# Patient Record
Sex: Female | Born: 1955 | Race: White | Hispanic: No | Marital: Married | Smoking: Current every day smoker
Health system: Southern US, Community
[De-identification: ages and names within clinical notes are randomized; demographics above are authoritative.]

## PROBLEM LIST (undated history)

## (undated) DIAGNOSIS — J449 Chronic obstructive pulmonary disease, unspecified: Secondary | ICD-10-CM

## (undated) DIAGNOSIS — E119 Type 2 diabetes mellitus without complications: Secondary | ICD-10-CM

## (undated) DIAGNOSIS — Z9981 Dependence on supplemental oxygen: Secondary | ICD-10-CM

## (undated) DIAGNOSIS — M549 Dorsalgia, unspecified: Secondary | ICD-10-CM

## (undated) DIAGNOSIS — R1310 Dysphagia, unspecified: Secondary | ICD-10-CM

## (undated) DIAGNOSIS — R011 Cardiac murmur, unspecified: Secondary | ICD-10-CM

## (undated) DIAGNOSIS — K219 Gastro-esophageal reflux disease without esophagitis: Secondary | ICD-10-CM

## (undated) DIAGNOSIS — E7800 Pure hypercholesterolemia, unspecified: Secondary | ICD-10-CM

## (undated) HISTORY — DX: Dorsalgia, unspecified: M54.9

## (undated) HISTORY — DX: Type 2 diabetes mellitus without complications: E11.9

## (undated) HISTORY — PX: BACK SURGERY: SHX140

## (undated) HISTORY — PX: COLON SURGERY: SHX602

## (undated) HISTORY — PX: NASAL SINUS SURGERY: SHX719

## (undated) HISTORY — DX: Dysphagia, unspecified: R13.10

## (undated) HISTORY — DX: Pure hypercholesterolemia, unspecified: E78.00

## (undated) HISTORY — PX: NECK SURGERY: SHX720

## (undated) HISTORY — PX: CHOLECYSTECTOMY: SHX55

---

## 2014-09-25 ENCOUNTER — Encounter (INDEPENDENT_AMBULATORY_CARE_PROVIDER_SITE_OTHER): Payer: Self-pay | Admitting: *Deleted

## 2014-10-23 ENCOUNTER — Encounter (INDEPENDENT_AMBULATORY_CARE_PROVIDER_SITE_OTHER): Payer: Self-pay | Admitting: Internal Medicine

## 2014-10-23 ENCOUNTER — Ambulatory Visit (INDEPENDENT_AMBULATORY_CARE_PROVIDER_SITE_OTHER): Payer: Federal, State, Local not specified - PPO | Admitting: Internal Medicine

## 2014-10-23 VITALS — BP 104/44 | HR 80 | Temp 98.1°F | Ht 63.0 in | Wt 148.8 lb

## 2014-10-23 DIAGNOSIS — I6529 Occlusion and stenosis of unspecified carotid artery: Secondary | ICD-10-CM | POA: Insufficient documentation

## 2014-10-23 DIAGNOSIS — R1314 Dysphagia, pharyngoesophageal phase: Secondary | ICD-10-CM

## 2014-10-23 DIAGNOSIS — E119 Type 2 diabetes mellitus without complications: Secondary | ICD-10-CM | POA: Insufficient documentation

## 2014-10-23 NOTE — Patient Instructions (Signed)
DG esophagram.   

## 2014-10-23 NOTE — Progress Notes (Addendum)
Subjective:    Patient ID: Dana GauzeJanet Dominguez, female    DOB: March 26, 1956, 59 y.o.   MRN: [id number]  HPI [facility] for dysphagia.Judeth Cornfield(Stephanie Crumpton, ANP). Former patient of Dr. Aleene DavidsonSpainhour. She is having problems swallowing drinks or food.  Symptoms really started after a rt carotid endarterectomy in December of 2015 by Dr. Cira Servantamber. Had small amt of dysphagia before surgery.  She says she is having trouble with all foods. Occurs every today.  She says she strangles if she drinks water.  She is eating in small amts. She cannot tell if foods are lodging or not.  Hx of EGD years ago for GERD. Appetite is good. No weight loss.  There is no abdominal pain. She has a BM one daily.  Last colonoscopy ? Dr. Aleene DavidsonSpainhour. Hx of diverticulitis and has a colon resection 2013. (Surgeon died in a plane crash).    Review of Systems Past Medical History  Diagnosis Date  . Diabetes   . High cholesterol   . Back pain     Past Surgical History  Procedure Laterality Date  . Back surgery      x 2 in the past  . Cholecystectomy      gallstone  . Carotid surgery      December 2015 rt   . Colon surgery      for diverticulitis  . Neck surgery      Allergies  Allergen Reactions  . Codeine     Dizzy, nausea, cold sweat  . Plavix [Clopidogrel Bisulfate]     SOB  . Protonix [Pantoprazole Sodium]     ? nausea  . Sporanox [Itraconazole]     Major rash    No current outpatient prescriptions on file prior to visit.   No current facility-administered medications on file prior to visit.    Outpatient Encounter Prescriptions as of 10/23/2014  Medication Sig  . ARIPiprazole (ABILIFY) 5 MG tablet Take 5 mg by mouth daily.  Marland Kitchen. aspirin 81 MG tablet Take 81 mg by mouth daily.  Marland Kitchen. atorvastatin (LIPITOR) 40 MG tablet Take 40 mg by mouth daily.  . busPIRone (BUSPAR) 15 MG tablet Take 15 mg by mouth 3 (three) times daily.  Marland Kitchen. desloratadine (CLARINEX) 5 MG  tablet Take 5 mg by mouth daily.  Marland Kitchen. dexlansoprazole (DEXILANT) 60 MG capsule Take 60 mg by mouth daily.  Marland Kitchen. docusate sodium (COLACE) 100 MG capsule Take 200 mg by mouth daily.  . DULoxetine (CYMBALTA) 60 MG capsule Take 60 mg by mouth 2 (two) times daily.  Marland Kitchen. HYDROcodone-acetaminophen (NORCO) 10-325 MG per tablet Take 1 tablet by mouth every 6 (six) hours as needed.  . hydrOXYzine (ATARAX/VISTARIL) 50 MG tablet Take 50 mg by mouth 2 (two) times daily.  . insulin glargine (LANTUS) 100 UNIT/ML injection Inject into the skin at bedtime. 23 units at night  . insulin regular (NOVOLIN R,HUMULIN R) 100 units/mL injection Inject into the skin 3 (three) times daily before meals. Sliding scale  . metFORMIN (GLUCOPHAGE) 1000 MG tablet Take 1,000 mg by mouth 2 (two) times daily with a meal.  . morphine (KADIAN) 20 MG 24 hr capsule Take 20 mg by mouth 2 (two) times daily.  . traZODone (DESYREL) 50 MG tablet Take 50 mg by mouth at bedtime.       Objective:   Physical Exam Blood pressure 104/44, pulse 80, temperature 98.1 F (36.7 C), height 5\' 3"  (1.6 m), weight 148 lb 12.8 oz (  67.495 kg). Alert and oriented. Skin warm and dry. Oral mucosa is moist.   . Sclera anicteric, conjunctivae is pink. Thyroid not enlarged. No cervical lymphadenopathy. Lungs clear. Heart regular rate and rhythm.  Abdomen is soft. Bowel sounds are positive. No hepatomegaly. No abdominal masses felt. No tenderness.  No edema to lower extremities.         Assessment & Plan:  Dysphagia to solids and liquids since her carotid surgery. Am going to get a pill esophagram. Further recommendations to follow.

## 2014-10-30 ENCOUNTER — Other Ambulatory Visit (HOSPITAL_COMMUNITY): Payer: Federal, State, Local not specified - PPO

## 2014-11-18 ENCOUNTER — Ambulatory Visit (HOSPITAL_COMMUNITY)
Admission: RE | Admit: 2014-11-18 | Discharge: 2014-11-18 | Disposition: A | Discharge status: Home / Self Care | Payer: Federal, State, Local not specified - PPO | Source: Ambulatory Visit | Attending: Internal Medicine | Admitting: Internal Medicine

## 2014-11-18 DIAGNOSIS — R1310 Dysphagia, unspecified: Secondary | ICD-10-CM | POA: Diagnosis present

## 2014-11-18 DIAGNOSIS — R1314 Dysphagia, pharyngoesophageal phase: Secondary | ICD-10-CM

## 2014-11-18 DIAGNOSIS — Z87891 Personal history of nicotine dependence: Secondary | ICD-10-CM | POA: Insufficient documentation

## 2014-11-18 DIAGNOSIS — E780 Pure hypercholesterolemia: Secondary | ICD-10-CM | POA: Insufficient documentation

## 2014-11-18 DIAGNOSIS — E119 Type 2 diabetes mellitus without complications: Secondary | ICD-10-CM | POA: Diagnosis not present

## 2014-11-19 ENCOUNTER — Telehealth (INDEPENDENT_AMBULATORY_CARE_PROVIDER_SITE_OTHER): Payer: Self-pay | Admitting: Internal Medicine

## 2014-11-19 NOTE — Telephone Encounter (Signed)
Ann, I have spoken with patient. She needs an EGD/ED

## 2014-11-20 ENCOUNTER — Encounter (INDEPENDENT_AMBULATORY_CARE_PROVIDER_SITE_OTHER): Payer: Self-pay | Admitting: *Deleted

## 2014-11-20 ENCOUNTER — Other Ambulatory Visit (INDEPENDENT_AMBULATORY_CARE_PROVIDER_SITE_OTHER): Payer: Self-pay | Admitting: *Deleted

## 2014-11-20 DIAGNOSIS — R1314 Dysphagia, pharyngoesophageal phase: Secondary | ICD-10-CM

## 2014-11-20 NOTE — Telephone Encounter (Signed)
EGD/ED sch'd 11/21/14, patient aware

## 2014-11-21 ENCOUNTER — Encounter (HOSPITAL_COMMUNITY)
Admission: RE | Disposition: A | Discharge status: Home / Self Care | Payer: Self-pay | Source: Ambulatory Visit | Attending: Internal Medicine

## 2014-11-21 ENCOUNTER — Ambulatory Visit (HOSPITAL_COMMUNITY)
Admission: RE | Admit: 2014-11-21 | Discharge: 2014-11-21 | Disposition: A | Discharge status: Home / Self Care | Payer: Federal, State, Local not specified - PPO | Source: Ambulatory Visit | Attending: Internal Medicine | Admitting: Internal Medicine

## 2014-11-21 ENCOUNTER — Encounter (HOSPITAL_COMMUNITY): Payer: Self-pay | Admitting: *Deleted

## 2014-11-21 DIAGNOSIS — Z9049 Acquired absence of other specified parts of digestive tract: Secondary | ICD-10-CM | POA: Insufficient documentation

## 2014-11-21 DIAGNOSIS — Z794 Long term (current) use of insulin: Secondary | ICD-10-CM | POA: Diagnosis not present

## 2014-11-21 DIAGNOSIS — R1310 Dysphagia, unspecified: Secondary | ICD-10-CM | POA: Diagnosis not present

## 2014-11-21 DIAGNOSIS — F17210 Nicotine dependence, cigarettes, uncomplicated: Secondary | ICD-10-CM | POA: Insufficient documentation

## 2014-11-21 DIAGNOSIS — K228 Other specified diseases of esophagus: Secondary | ICD-10-CM | POA: Diagnosis not present

## 2014-11-21 DIAGNOSIS — Z888 Allergy status to other drugs, medicaments and biological substances status: Secondary | ICD-10-CM | POA: Diagnosis not present

## 2014-11-21 DIAGNOSIS — R1314 Dysphagia, pharyngoesophageal phase: Secondary | ICD-10-CM

## 2014-11-21 DIAGNOSIS — Z886 Allergy status to analgesic agent status: Secondary | ICD-10-CM | POA: Insufficient documentation

## 2014-11-21 DIAGNOSIS — E780 Pure hypercholesterolemia: Secondary | ICD-10-CM | POA: Insufficient documentation

## 2014-11-21 DIAGNOSIS — K219 Gastro-esophageal reflux disease without esophagitis: Secondary | ICD-10-CM | POA: Diagnosis not present

## 2014-11-21 DIAGNOSIS — E119 Type 2 diabetes mellitus without complications: Secondary | ICD-10-CM | POA: Insufficient documentation

## 2014-11-21 DIAGNOSIS — Z7982 Long term (current) use of aspirin: Secondary | ICD-10-CM | POA: Diagnosis not present

## 2014-11-21 DIAGNOSIS — K222 Esophageal obstruction: Secondary | ICD-10-CM | POA: Diagnosis not present

## 2014-11-21 DIAGNOSIS — K3189 Other diseases of stomach and duodenum: Secondary | ICD-10-CM | POA: Diagnosis not present

## 2014-11-21 DIAGNOSIS — K2980 Duodenitis without bleeding: Secondary | ICD-10-CM | POA: Diagnosis not present

## 2014-11-21 HISTORY — DX: Cardiac murmur, unspecified: R01.1

## 2014-11-21 HISTORY — PX: ESOPHAGEAL DILATION: SHX303

## 2014-11-21 HISTORY — PX: ESOPHAGOGASTRODUODENOSCOPY: SHX5428

## 2014-11-21 HISTORY — DX: Gastro-esophageal reflux disease without esophagitis: K21.9

## 2014-11-21 HISTORY — DX: Dependence on supplemental oxygen: Z99.81

## 2014-11-21 LAB — GLUCOSE, CAPILLARY: Glucose-Capillary: 206 mg/dL — ABNORMAL HIGH (ref 70–99)

## 2014-11-21 SURGERY — EGD (ESOPHAGOGASTRODUODENOSCOPY)
Anesthesia: Moderate Sedation

## 2014-11-21 MED ORDER — MIDAZOLAM HCL 5 MG/5ML IJ SOLN
INTRAMUSCULAR | Status: DC | PRN
  Administered 2014-11-21: 1 mg via INTRAVENOUS
  Administered 2014-11-21: 2 mg via INTRAVENOUS
  Administered 2014-11-21 (×2): 1 mg via INTRAVENOUS
  Administered 2014-11-21: 2 mg via INTRAVENOUS

## 2014-11-21 MED ORDER — BUTAMBEN-TETRACAINE-BENZOCAINE 2-2-14 % EX AERO
INHALATION_SPRAY | CUTANEOUS | Status: DC | PRN
  Administered 2014-11-21: 1 via TOPICAL

## 2014-11-21 MED ORDER — MIDAZOLAM HCL 5 MG/5ML IJ SOLN
INTRAMUSCULAR | Status: AC
  Filled 2014-11-21: qty 10

## 2014-11-21 MED ORDER — MEPERIDINE HCL 50 MG/ML IJ SOLN
INTRAMUSCULAR | Status: AC
  Filled 2014-11-21: qty 1

## 2014-11-21 MED ORDER — MEPERIDINE HCL 50 MG/ML IJ SOLN
INTRAMUSCULAR | Status: DC | PRN
  Administered 2014-11-21 (×2): 25 mg via INTRAVENOUS

## 2014-11-21 MED ORDER — STERILE WATER FOR IRRIGATION IR SOLN
Status: DC | PRN
  Administered 2014-11-21: 09:00:00

## 2014-11-21 MED ORDER — SODIUM CHLORIDE 0.9 % IV SOLN
INTRAVENOUS | Status: DC
  Administered 2014-11-21: 09:00:00 via INTRAVENOUS

## 2014-11-21 NOTE — Op Note (Addendum)
EGD PROCEDURE REPORT  PATIENT:  Dana Dominguez  MR#:  [id number] Birthdate:  01/08/1956, 59 y.o., female Endoscopist:  Dr. Malissa HippoNajeeb U. Gaylin Bulthuis, MD Referred By:  Dr. Veto KempsKelli Banner, FNP  Procedure Date: 11/21/2014  Procedure:   EGD with ED  Indications:  Patient is 59 year old Caucasian female was chronic GERD and now presents with dysphagia to solids. She states she's had dysphagia for several months that has gotten worse since right carotid endarterectomy of December 2015. She points to suprasternal area at site of bolus obstruction but recent barium study revealed narrowing at GE junction not allowing passage of barium pill distally. Patient states heartburn is well controlled with therapy.            Informed Consent:  The risks, benefits, alternatives & imponderables which include, but are not limited to, bleeding, infection, perforation, drug reaction and potential missed lesion have been reviewed.  The potential for biopsy, lesion removal, esophageal dilation, etc. have also been discussed.  Questions have been answered.  All parties agreeable.  Please see history & physical in medical record for more information.  Medications:  Demerol 50 mg IV Versed 7 mg IV Cetacaine spray topically for oropharyngeal anesthesia  Description of procedure:  The endoscope was introduced through the mouth and advanced to the second portion of the duodenum without difficulty or limitations. The mucosal surfaces were surveyed very carefully during advancement of the scope and upon withdrawal.  Findings:  Esophagus:  Coarse appearance to esophageal mucosa with subtle circumferential rings distally but no assistance noted on passing the scope distally. No ring or stricture noted at GE junction. GEJ:  37 cm Stomach:  Moderate amount of food debris was noted in the stomach. Stomach distended very well with insufflation. Part of the mucosa at gastric body that was seen was normal. Antral mucosa was normal. Pyloric channel  was patent. Tenderness fundus and cardia were unremarkable. Duodenum:  Bulbar mucosa was normal. Abnormal mucosa off post bulbar duodenum with edema and erythema and fine nodularity.  Therapeutic/Diagnostic Maneuvers Performed:   Esophagus dilated by passing 54 French Maloney dilator to full insertion. Endoscope was passed again and linear mucosal disruption noted to cervical esophagus indicative of a disrupted web or subtle narrowing. No mucosal disruption noted distally. Biopsy was taken from distal esophagus and post bulbar duodenum for routine histology.  Complications:  None  Impression: Esophageal mucosal appearance suggestive of eosinophilic esophagitis. No ring or stricture noted distally. Esophagus dilated by passing 54 French Maloney dilator resulting in small linear mucosal disruption at cervical esophagus indicated of of either disrupted web or subtle narrowing. Moderate amount of food debris in stomach with patent pylorus suggestive of gastroparesis which she has history of. Post bulbar duodenitis. Biopsies taken from distal esophagus and second part of duodenum for routine histology.  Recommendations:  Standard instructions given. Gastroparesis diet. I will be contacting patient with biopsy results and further recommendations.   Denali Sharma U  11/21/2014  9:34 AM  CC: Dr. Veto KempsBANNER, KELLI, FNP & Dr. Bonnetta BarryNo ref. provider found

## 2014-11-21 NOTE — H&P (Signed)
Primitivo GauzeJanet Dominguez is an 59 y.o. female.   Chief Complaint: Patient is here for EGD and ED. HPI: Patient is 59 year old Caucasian female who was several year history of GERD with good control of heartburn who presents with dysphagia to solids. She points to suprasternal area site of bolus obstruction. She states she has had dysphagia for several months. Much worse and she had right carotid endarterectomy in December 2015. Food bolus always passes down. She also strangles with liquids. She's never had breathing problems or aspiration pneumonia. She has not lost any weight. She denies melena or rectal bleeding. She had barium pill study which revealed narrowing at GE junction and barium pill did not pass distally. It just resolved with few sips of water. Patient states she had her esophagus dilated many many years ago.  Past Medical History  Diagnosis Date  . Diabetes   . High cholesterol   . Back pain   . Heart murmur   . On supplemental oxygen therapy     3L [place]  at night  . GERD (gastroesophageal reflux disease)     Past Surgical History  Procedure Laterality Date  . Back surgery      x 2 in the past  . Cholecystectomy      gallstone  . Carotid surgery      December 2015 rt   . Colon surgery      for diverticulitis  . Neck surgery    . Stent in left arm      blockage 3 yrs ago.   . Nasal sinus surgery      History reviewed. No pertinent family history. Social History:  reports that she has been smoking.  She does not have any smokeless tobacco history on file. She reports that she does not drink alcohol or use illicit drugs.  Allergies:  Allergies  Allergen Reactions  . Codeine     Dizzy, nausea, cold sweat  . Plavix [Clopidogrel Bisulfate]     SOB  . Protonix [Pantoprazole Sodium]     ? nausea  . Sporanox [Itraconazole]     Major rash    Medications Prior to Admission  Medication Sig Dispense Refill  . ARIPiprazole (ABILIFY) 5 MG tablet Take 5 mg by mouth daily.    Marland Kitchen.  aspirin 81 MG tablet Take 81 mg by mouth daily.    Marland Kitchen. atorvastatin (LIPITOR) 40 MG tablet Take 40 mg by mouth daily.    . busPIRone (BUSPAR) 15 MG tablet Take 15 mg by mouth 3 (three) times daily.    Marland Kitchen. desloratadine (CLARINEX) 5 MG tablet Take 5 mg by mouth daily.    Marland Kitchen. dexlansoprazole (DEXILANT) 60 MG capsule Take 60 mg by mouth daily.    Marland Kitchen. docusate sodium (COLACE) 100 MG capsule Take 200 mg by mouth daily.    . DULoxetine (CYMBALTA) 60 MG capsule Take 60 mg by mouth 2 (two) times daily.    Marland Kitchen. HYDROcodone-acetaminophen (NORCO) 10-325 MG per tablet Take 1 tablet by mouth every 6 (six) hours as needed.    . hydrOXYzine (ATARAX/VISTARIL) 50 MG tablet Take 50 mg by mouth 2 (two) times daily.    . insulin glargine (LANTUS) 100 UNIT/ML injection Inject into the skin at bedtime. 23 units at night    . insulin regular (NOVOLIN R,HUMULIN R) 100 units/mL injection Inject into the skin 3 (three) times daily before meals. Sliding scale    . metFORMIN (GLUCOPHAGE) 1000 MG tablet Take 1,000 mg by mouth 2 (two) times daily  with a meal.    . morphine (KADIAN) 30 MG 24 hr capsule Take 30 mg by mouth 2 (two) times daily.    . traZODone (DESYREL) 100 MG tablet Take 100 mg by mouth at bedtime.       Results for orders placed or performed during the hospital encounter of 11/21/14 (from the past 48 hour(s))  Glucose, capillary     Status: Abnormal   Collection Time: 11/21/14  8:19 AM  Result Value Ref Range   Glucose-Capillary 206 (H) 70 - 99 mg/dL   No results found.  ROS  Blood pressure 131/56, pulse 94, temperature 99 F (37.2 C), temperature source Oral, resp. rate 18, SpO2 94 %. Physical Exam  Constitutional: She appears well-developed and well-nourished.  HENT:  Mouth/Throat: Oropharynx is clear and moist.  Eyes: Conjunctivae are normal. No scleral icterus.  Neck: No thyromegaly present.  Cardiovascular: Normal rate, regular rhythm and normal heart sounds.   No murmur heard. Respiratory: Effort  normal and breath sounds normal.  GI: Soft. She exhibits no distension and no mass. There is tenderness (mild midepigastric tenderness).  Musculoskeletal: She exhibits no edema.  Lymphadenopathy:    She has no cervical adenopathy.  Neurological: She is alert.  Skin: Skin is warm and dry.     Assessment/Plan Solid food dysphagia. Chronic GERD. Abnormal barium pill esophagogram revealing narrowing at GE junction. EGD with ED.  REHMAN,NAJEEB U 11/21/2014, 8:57 AM

## 2014-11-21 NOTE — Discharge Instructions (Signed)
Hold aspirin for 2 days resume other medications as before. Resume usual diet. No driving for 24 hours. Physician will call with biopsy results.  Esophagogastroduodenoscopy Care After Refer to this sheet in the next few weeks. These instructions provide you with information on caring for yourself after your procedure. Your caregiver may also give you more specific instructions. Your treatment has been planned according to current medical practices, but problems sometimes occur. Call your caregiver if you have any problems or questions after your procedure.  HOME CARE INSTRUCTIONS  Do not eat or drink anything until the numbing medicine (local anesthetic) has worn off and your gag reflex has returned. You will know that the local anesthetic has worn off when you can swallow comfortably.  Do not drive for 12 hours after the procedure or as directed by your caregiver.  Only take medicines as directed by your caregiver. SEEK MEDICAL CARE IF:   You cannot stop coughing.  You are not urinating at all or less than usual. SEEK IMMEDIATE MEDICAL CARE IF:  You have difficulty swallowing.  You cannot eat or drink.  You have worsening throat or chest pain.  You have dizziness, lightheadedness, or you faint.  You have nausea or vomiting.  You have chills.  You have a fever.  You have severe abdominal pain.  You have black, tarry, or bloody stools. Document Released: 07/19/2012 Document Reviewed: 07/19/2012 [facility] Patient Information 2015 [employer] [place]. This information is not intended to replace advice given to you by your health care provider. Make sure you discuss any questions you have with your health care provider.

## 2014-11-22 ENCOUNTER — Encounter (HOSPITAL_COMMUNITY): Payer: Self-pay | Admitting: Internal Medicine

## 2014-12-02 ENCOUNTER — Encounter (INDEPENDENT_AMBULATORY_CARE_PROVIDER_SITE_OTHER): Payer: Self-pay | Admitting: *Deleted

## 2014-12-02 ENCOUNTER — Telehealth (INDEPENDENT_AMBULATORY_CARE_PROVIDER_SITE_OTHER): Payer: Self-pay | Admitting: *Deleted

## 2014-12-02 NOTE — Telephone Encounter (Signed)
Progress Report: EGD on 11/21/14 - Marylu LundJanet can not feel any different. She is still having the coking and strangling feeling. The return phone number is [phone].

## 2014-12-02 NOTE — Telephone Encounter (Signed)
Notes Recorded by Malissa HippoNajeeb U Rehman, MD on 11/29/2014 at 7:33 AM Duodenal biopsy reveals lymphocytic inflammation of unknown significance. No villous atrophy noted. Esophageal biopsy shows changes of reflux esophagitis but no EoE. Patient reports only slight improvement in dysphagia. Patient advised to call us with progress report in 2 weeks. If dysphagia persists she will need esophageal manometry and impedance study. EGD with duodenal biopsy in 6-12 months. Report to PCP          Patient was advised of the Esophageal Manometry and Impedance Study. She does not feel that she could do this and ask if there is something else that could be done. She states that things are a little better but not a lot. She also does not understand about the biopsy that was done, and would appreciate that being explainedto her again.

## 2014-12-03 ENCOUNTER — Other Ambulatory Visit (INDEPENDENT_AMBULATORY_CARE_PROVIDER_SITE_OTHER): Payer: Self-pay | Admitting: Internal Medicine

## 2014-12-03 ENCOUNTER — Telehealth (HOSPITAL_COMMUNITY): Payer: Self-pay

## 2014-12-03 DIAGNOSIS — R1312 Dysphagia, oropharyngeal phase: Secondary | ICD-10-CM

## 2014-12-03 NOTE — Telephone Encounter (Signed)
12/03/14 scheduled MBSS and left a message at patient's number giving her all information

## 2014-12-03 NOTE — Telephone Encounter (Signed)
Info put in EPIC, speech therapy will call patient directly to schedule, patient is aware

## 2014-12-03 NOTE — Telephone Encounter (Signed)
Patient needs referral for evaluation for oropharyngeal dysphagia as well. Please make an appointment for her to see Ms. Havery Morosabney Porter SLP

## 2014-12-09 ENCOUNTER — Other Ambulatory Visit (HOSPITAL_COMMUNITY): Payer: Federal, State, Local not specified - PPO

## 2014-12-09 ENCOUNTER — Ambulatory Visit (HOSPITAL_COMMUNITY): Payer: Federal, State, Local not specified - PPO | Admitting: Speech Pathology

## 2014-12-18 ENCOUNTER — Other Ambulatory Visit (HOSPITAL_COMMUNITY): Payer: Federal, State, Local not specified - PPO

## 2014-12-18 ENCOUNTER — Encounter (HOSPITAL_COMMUNITY): Payer: Federal, State, Local not specified - PPO | Admitting: Speech Pathology

## 2014-12-23 ENCOUNTER — Inpatient Hospital Stay (HOSPITAL_COMMUNITY): Admission: RE | Admit: 2014-12-23 | Payer: Federal, State, Local not specified - PPO | Source: Ambulatory Visit

## 2014-12-23 ENCOUNTER — Ambulatory Visit (HOSPITAL_COMMUNITY): Payer: Federal, State, Local not specified - PPO | Admitting: Speech Pathology

## 2015-04-09 ENCOUNTER — Telehealth (INDEPENDENT_AMBULATORY_CARE_PROVIDER_SITE_OTHER): Payer: Self-pay | Admitting: *Deleted

## 2015-04-09 NOTE — Telephone Encounter (Signed)
Patient called in, she is still having issues with swallowing and it seems to be getting worse. She was sch'd 3 times (1 in April & 2 in May) for speech pathology appt with Rubye Beach but canceled due to death in family & issues with her nerves -- please advise with what you want her to have, thanks

## 2015-04-10 ENCOUNTER — Other Ambulatory Visit (INDEPENDENT_AMBULATORY_CARE_PROVIDER_SITE_OTHER): Payer: Self-pay | Admitting: Internal Medicine

## 2015-04-10 DIAGNOSIS — R1313 Dysphagia, pharyngeal phase: Secondary | ICD-10-CM

## 2015-04-10 NOTE — Telephone Encounter (Signed)
Info in EPIC, they contact patient directly to schedule, patient aware

## 2015-04-10 NOTE — Telephone Encounter (Signed)
I talked with patient. It appears she is having pharyngeal dysphagia. She must undergo evaluation by speech pathologist. She is agreeable. Please make an appointment with Ms. Havery Moros, SLP.

## 2015-04-15 ENCOUNTER — Other Ambulatory Visit (HOSPITAL_COMMUNITY): Payer: Federal, State, Local not specified - PPO

## 2015-04-15 ENCOUNTER — Ambulatory Visit (HOSPITAL_COMMUNITY): Payer: Federal, State, Local not specified - PPO | Admitting: Speech Pathology

## 2015-04-22 ENCOUNTER — Ambulatory Visit (HOSPITAL_COMMUNITY): Payer: Federal, State, Local not specified - PPO | Attending: Internal Medicine | Admitting: Speech Pathology

## 2015-04-22 ENCOUNTER — Ambulatory Visit (HOSPITAL_COMMUNITY)
Admission: RE | Admit: 2015-04-22 | Discharge: 2015-04-22 | Disposition: A | Discharge status: Home / Self Care | Payer: Federal, State, Local not specified - PPO | Source: Ambulatory Visit | Attending: Internal Medicine | Admitting: Internal Medicine

## 2015-04-22 ENCOUNTER — Encounter (HOSPITAL_COMMUNITY): Payer: Federal, State, Local not specified - PPO | Admitting: Speech Pathology

## 2015-04-22 DIAGNOSIS — K219 Gastro-esophageal reflux disease without esophagitis: Secondary | ICD-10-CM | POA: Diagnosis not present

## 2015-04-22 DIAGNOSIS — R1314 Dysphagia, pharyngoesophageal phase: Secondary | ICD-10-CM

## 2015-04-22 DIAGNOSIS — E780 Pure hypercholesterolemia: Secondary | ICD-10-CM | POA: Insufficient documentation

## 2015-04-22 DIAGNOSIS — R1312 Dysphagia, oropharyngeal phase: Secondary | ICD-10-CM | POA: Insufficient documentation

## 2015-04-22 DIAGNOSIS — E119 Type 2 diabetes mellitus without complications: Secondary | ICD-10-CM | POA: Insufficient documentation

## 2015-04-22 NOTE — Therapy (Signed)
[facility] [address] [place], [place], [address] Phone: [phone]   Fax:  [phone]  Modified Barium Swallow  Patient Details  Name: Dana Dominguez MRN: [id number] Date of Birth: May 30, 1956 Referring Provider:  Malissa Hippo, MD  Encounter Date: 04/22/2015      End of Session - 04/22/15 1827    Visit Number 1   Number of Visits 1   Authorization Type BC[employer]   SLP Start Time 1300   SLP Stop Time  1335   SLP Time Calculation (min) 35 min   Activity Tolerance Patient tolerated treatment well      Past Medical History  Diagnosis Date  . Diabetes   . High cholesterol   . Back pain   . Heart murmur   . On supplemental oxygen therapy     3L [place]  at night  . GERD (gastroesophageal reflux disease)     Past Surgical History  Procedure Laterality Date  . Back surgery      x 2 in the past  . Cholecystectomy      gallstone  . Carotid surgery      December 2015 rt   . Colon surgery      for diverticulitis  . Neck surgery    . Stent in left arm      blockage 3 yrs ago.   . Nasal sinus surgery    . Esophagogastroduodenoscopy N/A 11/21/2014    Procedure: ESOPHAGOGASTRODUODENOSCOPY (EGD);  Surgeon: Malissa Hippo, MD;  Location: [facility];  Service: Endoscopy;  Laterality: N/A;  1200 - moved to 4/7 @ 9:00  . Esophageal dilation N/A 11/21/2014    Procedure: ESOPHAGEAL DILATION;  Surgeon: Malissa Hippo, MD;  Location: [facility];  Service: Endoscopy;  Laterality: N/A;    There were no vitals filed for this visit.  Visit Diagnosis: Dysphagia, pharyngoesophageal phase      Subjective Assessment - 04/22/15 1820    Subjective Pt complains of globus sensation which is worse with solids   Special Tests MBSS   Currently in Pain? No/denies             General - 04/22/15 1820    General Information   Date of Onset 08/16/14   Other Pertinent Information Dana Dominguez is a 59 year old woman who was referred by Dr.  Karilyn Cota for MBSS due to pt's repeated complaints of difficulty swallowing. She reports that she had mild difficulties prior to her carotid endarterectomy procedure in [place] last December, but that it was exacerbated after that. She had a barium pill esophagram in April 2016 which showed: narrowing at GE junction not allowing passage of barium pill distally. Pt with history of GERD but states heartburn is well controlled with therapy.Dr. Karilyn Cota completed EGD with ED 11/21/2015 which showed: Esophageal mucosal appearance suggestive of eosinophilic esophagitis. No ring or stricture noted distally.   Type of Study Other (Comment)  MBSS   Reason for Referral Objectively evaluate swallowing function   Previous Swallow Assessment see above regarding GI studies   Diet Prior to this Study Regular;Thin liquids   Temperature Spikes Noted No   Respiratory Status Room air   History of Recent Intubation No   Behavior/Cognition Alert;Cooperative;Pleasant mood   Oral Cavity - Dentition Adequate natural dentition/normal for age  pt will be having top teeth removed for dentures   Oral Motor / Sensory Function Within functional limits   Self-Feeding Abilities Able to feed self  Patient Positioning Upright in chair/Tumbleform   Baseline Vocal Quality Normal   Volitional Cough Strong   Volitional Swallow Able to elicit   Anatomy Other (Comment)  evidence of c-spine fusion C6-7   Pharyngeal Secretions Not observed secondary MBS            Oral Preparation/Oral Phase - 04/22/15 1822    Oral Preparation/Oral Phase   Oral Phase Within functional limits  piecemeal deglutition with solids   Electrical stimulation - Oral Phase   Was Electrical Stimulation Used No          Pharyngeal Phase - 04/22/15 1822    Pharyngeal Phase   Pharyngeal Phase Impaired   Pharyngeal - Thin   Pharyngeal- Thin Cup Pharyngeal residue - cp segment   Pharyngeal- Thin Straw Pharyngeal residue - cp segment   Pharyngeal -  Solids   Pharyngeal- Puree Pharyngeal residue - cp segment   Pharyngeal- Mechanical Soft Pharyngeal residue - cp segment   Pharyngeal- Pill Pharyngeal residue - cp segment   Pharyngeal Phase - Comment   Pharyngeal Comment pill with stasis just below c-spine hardware   Electrical Stimulation - Pharyngeal Phase   Was Electrical Stimulation Used No          Cricopharyngeal Phase - 04/22/15 1824    Cervical Esophageal Phase   Cervical Esophageal Phase Impaired   Cervical Esophageal Phase - Thin   Thin Cup Esophageal backflow into the pharynx   Cervical Esophageal Phase - Solids   Puree Esophageal backflow into the pharynx;Prominent cricopharyngeal segment;Esophageal backflow into cervical esophagus   Regular Esophageal backflow into the pharynx;Prominent cricopharyngeal segment;Esophageal backflow into cervical esophagus   Pill Prominent cricopharyngeal segment;Esophageal backflow into cervical esophagus   Cervical Esophageal Phase - Comment   Cervical Esophageal Comment solids and pill with stasis just below C6-7 fusion hardware   Other Esophageal Phase Observations delayed transition of pill from distal esophagus to stomach, but cleared after sequential swallows liquid                  Plan - 04/22/15 1828    Clinical Impression Statement Oropharyngeal swallow is essentially WFL, however pt with decreased relaxation of CP muscle/slightly prominent which negatively impacts bolus clearance. SLP visualized bony prominences along C-spine, however this did not seem to impede bolus flow. Pt with C6-7 fusion and hardware which did seem to impede bolus flow. Liquids passed through with only mild residuals post swallow, however puree, solids, and pill were worse with the pill resting just below the level of the hardware for almost a minute. This was only cleared when pt was cued to take sequential swallows of liquid. Pt was sensate to residual bolus in pyriforms and near UES. No evidence  of backflow of bolus into laryngeal space or up posterior pharyngeal wall- localized to CP area only. Pt benefitted from alternating solids and liquids and completing dry swallows. After reviewing Dr. Patty Sermons EGD report, he noted "Esophagus dilated by passing 54 French Maloney dilator resulting in small linear mucosal disruption at cervical esophagus indicated of of either disrupted web or subtle narrowing". SLP went back and reviewed the barium swallow study images with radiologist (completed in April 2016) and no stasis noted at CP, however pt only requried to swallow liquids for that assessement. MBSS revealed stasis at the level of the CP with solids, semi-solids, and pill today. Pt advised to alternate solids and liquids, swallow several times for each bite solid, and adhere to reflux precautions. Risk for aspiration is  minimal due to good sensation and no backflow of bolus into laryngeal space. Swallowing exercises are unlikely to be beneficial given seemingly adequate hyolaryngeal excursion and tongue base retraction. Pt will need to utilize compensatory strategies which she acknowledges. Will fax report to Dr. Karilyn Cota.    Consulted and Agree with Plan of Care Patient            Recommendations/Treatment - 04/22/15 1826    Swallow Evaluation Recommendations   Recommended Consults --  Discuss with Dr. Karilyn Cota   SLP Diet Recommendations Age appropriate regular solids;Thin   Liquid Administration via Cup;Straw   Medication Administration Whole meds with liquid   Supervision Patient able to self feed   Compensations Multiple dry swallows after each bite/sip;Follow solids with liquid   Postural Changes Remain semi-upright after after feeds/meals (Comment);Seated upright at 90 degrees          Prognosis - 04/22/15 1827    Prognosis   Prognosis for Safe Diet Advancement Good   Individuals Consulted   Consulted and Agree with Results and Recommendations Patient   Report Sent to  Referring  physician      Problem List Patient Active Problem List   Diagnosis Date Noted  . Diabetes 10/23/2014  . Carotid stenosis 10/23/2014   Thank you,  Havery Moros, CCC-SLP [id number]  [facility] 04/22/2015, 6:44 PM  [facility] [address] [place], [place], [address] Phone: [phone]   Fax:  [phone]

## 2015-04-22 NOTE — Procedures (Signed)
[facility] [address] [place], [place], [address] Phone: [phone]   Fax:  [phone]  Modified Barium Swallow  Patient Details  Name: Dana Dominguez MRN: [id number] Date of Birth: May 30, 1956 Referring Provider:  Malissa Hippo, MD  Encounter Date: 04/22/2015      End of Session - 04/22/15 1827    Visit Number 1   Number of Visits 1   Authorization Type BCBS   SLP Start Time 1300   SLP Stop Time  1335   SLP Time Calculation (min) 35 min   Activity Tolerance Patient tolerated treatment well      Past Medical History  Diagnosis Date  . Diabetes   . High cholesterol   . Back pain   . Heart murmur   . On supplemental oxygen therapy     3L [place]  at night  . GERD (gastroesophageal reflux disease)     Past Surgical History  Procedure Laterality Date  . Back surgery      x 2 in the past  . Cholecystectomy      [facility]  . Carotid surgery      December 2015 rt   . Colon surgery      for diverticulitis  . Neck surgery    . Stent in left arm      blockage 3 yrs ago.   . Nasal sinus surgery    . Esophagogastroduodenoscopy N/A 11/21/2014    Procedure: ESOPHAGOGASTRODUODENOSCOPY (EGD);  Surgeon: Malissa Hippo, MD;  [facility];  Service: Endoscopy;  Laterality: N/A;  1200 - moved to 4/7 @ 9:00  . Esophageal dilation N/A 11/21/2014    Procedure: ESOPHAGEAL DILATION;  Surgeon: Malissa Hippo, MD;  [facility];  Service: Endoscopy;  Laterality: N/A;    There were no vitals filed for this visit.  Visit Diagnosis: Dysphagia, pharyngoesophageal phase      Subjective Assessment - 04/22/15 1820    Subjective Pt complains of globus sensation which is worse with solids   Special Tests MBSS   Currently in Pain? No/denies             General - 04/22/15 1820    General Information   Date of Onset 08/16/14   Other Pertinent Information Dana Dominguez is a 59 year old woman who was referred by Dr.  Karilyn Cota for MBSS due to pt's repeated complaints of difficulty swallowing. She reports that she had mild difficulties prior to her carotid endarterectomy procedure in [place] last December, but that it was exacerbated after that. She had a barium pill esophagram in April 2016 which showed: narrowing at GE junction not allowing passage of barium pill distally. Pt with history of GERD but states heartburn is well controlled with therapy.Dr. Karilyn Cota completed EGD with ED 11/21/2015 which showed: Esophageal mucosal appearance suggestive of eosinophilic esophagitis. No ring or stricture noted distally.   Type of Study Other (Comment)  MBSS   Reason for Referral Objectively evaluate swallowing function   Previous Swallow Assessment see above regarding GI studies   Diet Prior to this Study Regular;Thin liquids   Temperature Spikes Noted No   Respiratory Status Room air   History of Recent Intubation No   Behavior/Cognition Alert;Cooperative;Pleasant mood   Oral Cavity - Dentition Adequate natural dentition/normal for age  pt will be having top teeth removed for dentures   Oral Motor / Sensory Function Within functional limits   Self-Feeding Abilities Able to feed self  Patient Positioning Upright in chair/Tumbleform   Baseline Vocal Quality Normal   Volitional Cough Strong   Volitional Swallow Able to elicit   Anatomy Other (Comment)  evidence of c-spine fusion C6-7   Pharyngeal Secretions Not observed secondary MBS            Oral Preparation/Oral Phase - 04/22/15 1822    Oral Preparation/Oral Phase   Oral Phase Within functional limits  piecemeal deglutition with solids   Electrical stimulation - Oral Phase   Was Electrical Stimulation Used No          Pharyngeal Phase - 04/22/15 1822    Pharyngeal Phase   Pharyngeal Phase Impaired   Pharyngeal - Thin   Pharyngeal- Thin Cup Pharyngeal residue - cp segment   Pharyngeal- Thin Straw Pharyngeal residue - cp segment   Pharyngeal -  Solids   Pharyngeal- Puree Pharyngeal residue - cp segment   Pharyngeal- Mechanical Soft Pharyngeal residue - cp segment   Pharyngeal- Pill Pharyngeal residue - cp segment   Pharyngeal Phase - Comment   Pharyngeal Comment pill with stasis just below c-spine hardware   Electrical Stimulation - Pharyngeal Phase   Was Electrical Stimulation Used No          Cricopharyngeal Phase - 04/22/15 1824    Cervical Esophageal Phase   Cervical Esophageal Phase Impaired   Cervical Esophageal Phase - Thin   Thin Cup Esophageal backflow into the pharynx   Cervical Esophageal Phase - Solids   Puree Esophageal backflow into the pharynx;Prominent cricopharyngeal segment;Esophageal backflow into cervical esophagus   Regular Esophageal backflow into the pharynx;Prominent cricopharyngeal segment;Esophageal backflow into cervical esophagus   Pill Prominent cricopharyngeal segment;Esophageal backflow into cervical esophagus   Cervical Esophageal Phase - Comment   Cervical Esophageal Comment solids and pill with stasis just below C6-7 fusion hardware   Other Esophageal Phase Observations delayed transition of pill from distal esophagus to stomach, but cleared after sequential swallows liquid                  Plan - 04/22/15 1828    Clinical Impression Statement Oropharyngeal swallow is essentially WFL, however pt with decreased relaxation of CP muscle/slightly prominent which negatively impacts bolus clearance. SLP visualized bony prominences along C-spine, however this did not seem to impede bolus flow. Pt with C6-7 fusion and hardware which did seem to impede bolus flow. Liquids passed through with only mild residuals post swallow, however puree, solids, and pill were worse with the pill resting just below the level of the hardware for almost a minute. This was only cleared when pt was cued to take sequential swallows of liquid. Pt was sensate to residual bolus in pyriforms and near UES. No evidence  of backflow of bolus into laryngeal space or up posterior pharyngeal wall- localized to CP area only. Pt benefitted from alternating solids and liquids and completing dry swallows. After reviewing Dr. Patty Sermons EGD report, he noted "Esophagus dilated by passing 54 French Maloney dilator resulting in small linear mucosal disruption at cervical esophagus indicated of of either disrupted web or subtle narrowing". SLP went back and reviewed the barium swallow study images with radiologist (completed in April 2016) and no stasis noted at CP, however pt only requried to swallow liquids for that assessement. MBSS revealed stasis at the level of the CP with solids, semi-solids, and pill today. Pt advised to alternate solids and liquids, swallow several times for each bite solid, and adhere to reflux precautions. Risk for aspiration is  minimal due to good sensation and no backflow of bolus into laryngeal space. Swallowing exercises are unlikely to be beneficial given seemingly adequate hyolaryngeal excursion and tongue base retraction. Pt will need to utilize compensatory strategies which she acknowledges. Will fax report to Dr. Karilyn Cota.    Consulted and Agree with Plan of Care Patient            Recommendations/Treatment - 04/22/15 1826    Swallow Evaluation Recommendations   Recommended Consults --  Discuss with Dr. Karilyn Cota   SLP Diet Recommendations Age appropriate regular solids;Thin   Liquid Administration via Cup;Straw   Medication Administration Whole meds with liquid   Supervision Patient able to self feed   Compensations Multiple dry swallows after each bite/sip;Follow solids with liquid   Postural Changes Remain semi-upright after after feeds/meals (Comment);Seated upright at 90 degrees          Prognosis - 04/22/15 1827    Prognosis   Prognosis for Safe Diet Advancement Good   Individuals Consulted   Consulted and Agree with Results and Recommendations Patient   Report Sent to  Referring  physician      Problem List Patient Active Problem List   Diagnosis Date Noted  . Diabetes 10/23/2014  . Carotid stenosis 10/23/2014    PORTER,DABNEY 04/22/2015, 6:49 PM  [facility] [address] [place], [place], [address] Phone: [phone]   Fax:  [phone]

## 2015-05-01 ENCOUNTER — Telehealth (INDEPENDENT_AMBULATORY_CARE_PROVIDER_SITE_OTHER): Payer: Self-pay | Admitting: *Deleted

## 2015-05-01 NOTE — Telephone Encounter (Signed)
Dana Dominguez - would you please review for result and talk with the patient, as Dr.Rehman is out of office ?

## 2015-05-01 NOTE — Telephone Encounter (Signed)
Dana Dominguez has not heard from the Swallowing Test done on 04/22/15. The return phone number is [phone].

## 2015-05-02 NOTE — Telephone Encounter (Signed)
I advised her Dr. Karilyn Cota would talk with her about results when he returns next week and his recommendations.  She was satisfied with this.

## 2015-05-05 NOTE — Telephone Encounter (Signed)
Forward to Dr.Rehman - Patient is wanting to know results of test.

## 2015-05-08 ENCOUNTER — Telehealth (INDEPENDENT_AMBULATORY_CARE_PROVIDER_SITE_OTHER): Payer: Self-pay | Admitting: *Deleted

## 2015-05-08 NOTE — Telephone Encounter (Signed)
Dana Dominguez has not heard back on the Modified Barium Swallow. She was told by Dana Dominguez see a lot of information that Dana Dominguez needed to go over with her. The return phone number is [phone]. "Dana Dominguez is not real nice. Short and to the point."

## 2015-05-08 NOTE — Telephone Encounter (Signed)
Patient's call returned. She says dysphagia is getting worse. She is having regurgitation when she swallows. Patient advised to undergo high resolution esophageal manometry and impedance study. Patient will call office and let us know when she could go to [facility] for this study.

## 2015-05-08 NOTE — Telephone Encounter (Signed)
Spoke to patient and gave her my number to call me when she is ready to have manometry, advised her at that time I would faxed her information to [facility] and someone from there would contact her with appt. She states she will call me when she is ready to have test 

## 2015-05-08 NOTE — Telephone Encounter (Signed)
Call returned. Patient needs esophageal manometry and impedance study. Patient will call office to let us know when she could go to [facility]

## 2015-05-08 NOTE — Telephone Encounter (Signed)
Spoke to patient and gave her my number to call me when she is ready to have manometry, advised her at that time I would faxed her information to [facility] and someone from there would contact her with appt. She states she will call me when she is ready to have test

## 2015-05-13 ENCOUNTER — Telehealth (INDEPENDENT_AMBULATORY_CARE_PROVIDER_SITE_OTHER): Payer: Self-pay | Admitting: *Deleted

## 2015-05-13 NOTE — Telephone Encounter (Signed)
Patient is on recall for EGD with biopsy -- I know you want her to have manometry w/ impedance study @ [facility] -- does she still need EGD with biopsy -- please advise

## 2015-05-15 NOTE — Telephone Encounter (Signed)
Please proceed with esophageal manometry and impedance study. EGD with duodenal biopsy to be done at a later date.

## 2015-11-13 ENCOUNTER — Encounter (HOSPITAL_COMMUNITY): Payer: Self-pay

## 2016-09-22 ENCOUNTER — Encounter (INDEPENDENT_AMBULATORY_CARE_PROVIDER_SITE_OTHER): Payer: Self-pay | Admitting: Internal Medicine

## 2016-09-29 ENCOUNTER — Ambulatory Visit (INDEPENDENT_AMBULATORY_CARE_PROVIDER_SITE_OTHER): Payer: Federal, State, Local not specified - PPO | Admitting: Internal Medicine

## 2016-09-29 ENCOUNTER — Telehealth (INDEPENDENT_AMBULATORY_CARE_PROVIDER_SITE_OTHER): Payer: Self-pay | Admitting: Internal Medicine

## 2016-09-29 ENCOUNTER — Encounter (INDEPENDENT_AMBULATORY_CARE_PROVIDER_SITE_OTHER): Payer: Self-pay | Admitting: Internal Medicine

## 2016-09-29 VITALS — BP 150/70 | HR 80 | Temp 98.6°F | Ht 63.0 in | Wt 147.8 lb

## 2016-09-29 DIAGNOSIS — K219 Gastro-esophageal reflux disease without esophagitis: Secondary | ICD-10-CM

## 2016-09-29 DIAGNOSIS — R1310 Dysphagia, unspecified: Secondary | ICD-10-CM | POA: Diagnosis not present

## 2016-09-29 DIAGNOSIS — R1319 Other dysphagia: Secondary | ICD-10-CM

## 2016-09-29 HISTORY — DX: Dysphagia, unspecified: R13.10

## 2016-09-29 NOTE — Telephone Encounter (Signed)
Ann, Impendence and Manometry test.

## 2016-09-29 NOTE — Patient Instructions (Signed)
I will discuss with Dr. Rehman. 

## 2016-09-29 NOTE — Progress Notes (Addendum)
Subjective:    Patient ID: Dana GauzeJanet Pakula, female    DOB: [birth year], 61 y.o.   MRN: [id number]  HPI Referred by Dr. Lucienne MinksMadan for EGD/Esophageal manometry. She tells me she has some epigastric pain. She has some dysphagia. She says she feels bad all the time. Symptoms for 2 years. She says the Dexilant helps her acid reflux.  She says all foods are lodging. Foods are slow to go down. Her appetite is so so. She has not lost any weight. She has a BM daily or she may go for a week. She was seen back in 2016 by Dr. Karilyn Cotaehman and underwent and EGD/ED. She was also referred to speech pathology.  [facility] and Impedence test but did not follow.  She had a colonoscopy in 2014 by Dr. Aleene DavidsonSpainhour which was normal per records She says she had part of her colon removed in 2014 for a colon blockage from diverticulitis byy Dr. 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  Pulmonary hypertension and venous reflux.  Recently underwent an DG Esophagram. Per records it showed severe dysmotility. A 13mm barium tablet passed without difficulty. She says she had a postive stool card two weeks ago at Dr. Lucienne MinksMadan.    Family hx of colon cancer in grandmother and an aunt  11/21/2014 EGD/ED: Chronic GERD with dysphagia: Impression: Esophageal mucosal appearance suggestive of eosinophilic esophagitis. No ring or stricture noted distally. Esophagus dilated by passing 54 French Maloney dilator resulting in small linear mucosal disruption at cervical esophagus indicated of of either disrupted web or subtle narrowing. Moderate amount of food debris in stomach with patent pylorus suggestive of gastroparesis which she has history of. Post bulbar duodenitis. Biopsies taken from distal esophagus and second part of duodenum for routine histology.   Esophageal biopsy shows changes of reflux esophagitis but no EoE. Patient reports only slight improvement in dysphagia. Has been seen by Ms. Havery Morosabney  Porter for dysphagia.  04/22/2015 Swallow Evaluation Recommendations:     Swallow Evaluation Recommendations   Recommended Consults --  Discuss with Dr. Karilyn Cotaehman   SLP Diet Recommendations Age appropriate regular solids;Thin   Liquid Administration via Cup;Straw   Medication Administration Whole meds with liquid   Supervision Patient able to self feed   Compensations Multiple dry swallows after each bite/sip;Follow solids with liquid   Postural Changes Remain semi-upright after after feeds/meals (Comment);Seated upright at 90 degrees     11/18/2014 DG Esophagus: dyspohagia  IMPRESSION: Narrowing at the gastroesophageal junction, obstructing a 12.5 mm diameter barium tablet. Review of Systems     09/13/2016 H and H 15.9 and 53.9 Past Medical History:  Diagnosis Date  . Back pain   . Diabetes (HCC)   . Dysphagia 09/29/2016  . GERD (gastroesophageal reflux disease)   . Heart murmur   . High cholesterol   . On supplemental oxygen therapy    3L [place]  at night    Past Surgical History:  Procedure Laterality Date  . BACK SURGERY     x 2 in the past  . carotid surgery     December 2015 rt   . CHOLECYSTECTOMY     [facility]  . COLON SURGERY     for diverticulitis  . ESOPHAGEAL DILATION N/A 11/21/2014   Procedure: ESOPHAGEAL DILATION;  Surgeon: Malissa HippoNajeeb U Rehman, MD;  [facility];  Service: Endoscopy;  Laterality: N/A;  . ESOPHAGOGASTRODUODENOSCOPY N/A 11/21/2014   Procedure: ESOPHAGOGASTRODUODENOSCOPY (EGD);  Surgeon: Malissa HippoNajeeb U Rehman, MD;  [facility];  Service: Endoscopy;  Laterality: N/A;  1200 - moved to 4/7 @ 9:00  . NASAL SINUS SURGERY    . NECK SURGERY    . stent in left arm     blockage 3 yrs ago.     Allergies  Allergen Reactions  . Codeine     Dizzy, nausea, cold sweat  . Plavix [Clopidogrel Bisulfate]     SOB  . Protonix [Pantoprazole Sodium]     ? nausea  . Sporanox [Itraconazole]     Major rash    Current Outpatient Prescriptions  on File Prior to Visit  Medication Sig Dispense Refill  . ARIPiprazole (ABILIFY) 5 MG tablet Take 5 mg by mouth daily.    Marland Kitchen atorvastatin (LIPITOR) 40 MG tablet Take 40 mg by mouth daily.    . busPIRone (BUSPAR) 15 MG tablet Take 15 mg by mouth 3 (three) times daily.    Marland Kitchen desloratadine (CLARINEX) 5 MG tablet Take 5 mg by mouth daily.    Marland Kitchen dexlansoprazole (DEXILANT) 60 MG capsule Take 60 mg by mouth daily.    Marland Kitchen docusate sodium (COLACE) 100 MG capsule Take 200 mg by mouth daily.    . DULoxetine (CYMBALTA) 60 MG capsule Take 60 mg by mouth 2 (two) times daily.    . hydrOXYzine (ATARAX/VISTARIL) 50 MG tablet Take 50 mg by mouth 2 (two) times daily.    . insulin regular (NOVOLIN R,HUMULIN R) 100 units/mL injection Inject into the skin 3 (three) times daily before meals. Sliding scale    . metFORMIN (GLUCOPHAGE) 1000 MG tablet Take 1,000 mg by mouth 2 (two) times daily with a meal.    . traZODone (DESYREL) 100 MG tablet Take 100 mg by mouth at bedtime.     . insulin glargine (LANTUS) 100 UNIT/ML injection Inject into the skin at bedtime. 23 units at night     No current facility-administered medications on file prior to visit.        Objective:   Physical Exam Blood pressure (!) 150/70, pulse 80, temperature 98.6 F (37 C), height 5\' 3"  (1.6 m), weight 147 lb 12.8 oz (67 kg).  Alert and oriented. Skin warm and dry. Oral mucosa is moist.   . Sclera anicteric, conjunctivae is pink. Thyroid not enlarged. No cervical lymphadenopathy. Lungs clear. Heart regular rate and rhythm.  Abdomen is soft. Bowel sounds are positive. No hepatomegaly. No abdominal masses felt. Slight epigastric tenderness.  No edema to lower extremities.  .      Assessment & Plan:  Dysphagia. Esophagram revealed severe dysmotility. I discussed with Dr Karilyn Cota.Impedence and Manometry.

## 2016-09-30 NOTE — Telephone Encounter (Signed)
[facility], they contact patient with appt

## 2016-10-01 ENCOUNTER — Encounter (INDEPENDENT_AMBULATORY_CARE_PROVIDER_SITE_OTHER): Payer: Self-pay

## 2016-12-14 ENCOUNTER — Telehealth: Payer: Self-pay

## 2016-12-14 NOTE — Telephone Encounter (Signed)
PCP (Dianna for [facility]) called to see what she needed to do about sending a referral to [place]. She said that the [name] is a patient of [facility] but she no longer wanted to see them. I told her that I would have to talk with the manger. Her call back number is [phone] ext. [phone].

## 2016-12-14 NOTE — Telephone Encounter (Signed)
I tried to reach the patient's pcp, however it stated it was a non-working number. If they call back please let them know that they will need to fax over a referral with medical records for prior approval from one of our providers.

## 2017-02-04 ENCOUNTER — Telehealth: Payer: Self-pay | Admitting: Gastroenterology

## 2017-02-04 NOTE — Telephone Encounter (Signed)
Received referral to schedule patient an OV for anemia. [facility] but is wanting to transfer to our office because she "was not happy with that practice"  Dr. Myrtie Neitheranis is Doc of Day for 02/03/17 PM. [facility] and placed on his desk for review.

## 2017-02-04 NOTE — Telephone Encounter (Signed)
Hematology note indicates that the referral is not for anemia - it is for chronic dysphagia.  I do not know if I will have more to offer than prior GI evaluation, but I would be glad to see her if she would like.  It would be a next available new patient appointment.

## 2017-02-04 NOTE — Telephone Encounter (Signed)
Patient states that she has decided to schedule with [place]

## 2018-01-30 LAB — LIPID PANEL
Cholesterol: 125 (ref 0–200)
HDL: 50 (ref 35–70)
LDL Cholesterol: 51
Triglycerides: 122 (ref 40–160)

## 2018-01-30 LAB — TSH: TSH: 1.3 (ref 0.41–5.90)

## 2018-01-30 LAB — HEMOGLOBIN A1C: Hemoglobin A1C: 11.8

## 2018-01-30 LAB — BASIC METABOLIC PANEL
BUN: 12 (ref 4–21)
CREATININE: 0.7 (ref 0.5–1.1)

## 2018-03-27 ENCOUNTER — Ambulatory Visit: Payer: Self-pay | Admitting: "Endocrinology

## 2018-04-26 ENCOUNTER — Ambulatory Visit (INDEPENDENT_AMBULATORY_CARE_PROVIDER_SITE_OTHER): Payer: Federal, State, Local not specified - PPO | Admitting: "Endocrinology

## 2018-04-26 ENCOUNTER — Other Ambulatory Visit: Payer: Self-pay | Admitting: "Endocrinology

## 2018-04-26 ENCOUNTER — Encounter: Payer: Self-pay | Admitting: "Endocrinology

## 2018-04-26 VITALS — BP 105/66 | HR 88 | Ht 63.0 in | Wt 151.0 lb

## 2018-04-26 DIAGNOSIS — I10 Essential (primary) hypertension: Secondary | ICD-10-CM

## 2018-04-26 DIAGNOSIS — F17200 Nicotine dependence, unspecified, uncomplicated: Secondary | ICD-10-CM

## 2018-04-26 DIAGNOSIS — E785 Hyperlipidemia, unspecified: Secondary | ICD-10-CM | POA: Insufficient documentation

## 2018-04-26 DIAGNOSIS — E782 Mixed hyperlipidemia: Secondary | ICD-10-CM

## 2018-04-26 DIAGNOSIS — E1165 Type 2 diabetes mellitus with hyperglycemia: Secondary | ICD-10-CM | POA: Diagnosis not present

## 2018-04-26 MED ORDER — METFORMIN HCL ER (OSM) 1000 MG PO TB24: 1000.0000 mg | ORAL_TABLET | Freq: Every day | ORAL | 2 refills | Status: DC

## 2018-04-26 NOTE — Progress Notes (Signed)
Endocrinology Consult Note       04/26/2018, 4:28 PM   Subjective:    Patient ID: Dana Dominguez, female    DOB: [birth year].  Dana Dominguez is being seen in consultation for management of currently uncontrolled symptomatic diabetes requested by  Virgina Norfolk, MD.   Past Medical History:  Diagnosis Date  . Back pain   . Diabetes (HCC)   . Dysphagia 09/29/2016  . GERD (gastroesophageal reflux disease)   . Heart murmur   . High cholesterol   . On supplemental oxygen therapy    3L   at night   Past Surgical History:  Procedure Laterality Date  . BACK SURGERY     x 2 in the past  . carotid surgery     December 2015 rt   . CHOLECYSTECTOMY     [facility]  . COLON SURGERY     for diverticulitis  . ESOPHAGEAL DILATION N/A 11/21/2014   Procedure: ESOPHAGEAL DILATION;  Surgeon: Malissa Hippo, MD;  Location: [facility];  Service: Endoscopy;  Laterality: N/A;  . ESOPHAGOGASTRODUODENOSCOPY N/A 11/21/2014   Procedure: ESOPHAGOGASTRODUODENOSCOPY (EGD);  Surgeon: Malissa Hippo, MD;  Location: [facility];  Service: Endoscopy;  Laterality: N/A;  1200 - moved to 4/7 @ 9:00  . NASAL SINUS SURGERY    . NECK SURGERY    . stent in left arm     blockage 3 yrs ago.    Social History   Socioeconomic History  . Marital status: Married    Spouse name: Not on file  . Number of children: Not on file  . Years of education: Not on file  . Highest education level: Not on file  Occupational History  . Not on file  Social Needs  . Financial resource strain: Not on file  . Food insecurity:    Worry: Not on file    Inability: Not on file  . Transportation needs:    Medical: Not on file    Non-medical: Not on file  Tobacco Use  . Smoking status: Current Every Day Smoker    Packs/day: 1.00    Years: 50.00    Pack years: 50.00  . Smokeless tobacco: Never Used  . Tobacco comment: 1 pack day since 12 yrs   Substance and Sexual Activity  . Alcohol use: No    Alcohol/week: 0.0 standard drinks  . Drug use: No  . Sexual activity: Not on file  Lifestyle  . Physical activity:    Days per week: Not on file    Minutes per session: Not on file  . Stress: Not on file  Relationships  . Social connections:    Talks on phone: Not on file    Gets together: Not on file    Attends religious service: Not on file    Active member of club or organization: Not on file    Attends meetings of clubs or organizations: Not on file    Relationship status: Not on file  Other Topics Concern  . Not on file  Social History Narrative  . Not on file   Outpatient Encounter Medications as of  04/26/2018  Medication Sig  . ANORO ELLIPTA 62.5-25 MCG/INH AEPB daily.  . ARIPiprazole (ABILIFY) 5 MG tablet Take 5 mg by mouth daily.  Marland Kitchen aspirin EC 81 MG tablet Take 81 mg by mouth daily.  Marland Kitchen atorvastatin (LIPITOR) 80 MG tablet Take 80 mg by mouth daily.   . busPIRone (BUSPAR) 15 MG tablet Take 15 mg by mouth 3 (three) times daily.  . carvedilol (COREG) 3.125 MG tablet Take 3.125 mg by mouth daily.  Marland Kitchen desloratadine (CLARINEX) 5 MG tablet Take 5 mg by mouth daily.  Marland Kitchen dexlansoprazole (DEXILANT) 60 MG capsule Take 60 mg by mouth daily.  Marland Kitchen dicyclomine (BENTYL) 10 MG capsule Take 10 mg by mouth 2 (two) times daily.  . DULoxetine (CYMBALTA) 60 MG capsule Take 60 mg by mouth 2 (two) times daily.  . hydrOXYzine (ATARAX/VISTARIL) 50 MG tablet Take 50 mg by mouth 2 (two) times daily.  . Insulin Glargine (BASAGLAR KWIKPEN) 100 UNIT/ML SOPN Inject 40 Units into the skin at bedtime.  . insulin regular (NOVOLIN R,HUMULIN R) 100 units/mL injection Inject 5-11 Units into the skin 3 (three) times daily before meals. Sliding scale   . traZODone (DESYREL) 100 MG tablet Take 100 mg by mouth at bedtime.   . [DISCONTINUED] SYNJARDY XR 12.12-998 MG TB24 2 (two) times daily.  . metformin (FORTAMET) 1000 MG (OSM) 24 hr tablet Take 1 tablet (1,000  mg total) by mouth daily with breakfast.  . [DISCONTINUED] canagliflozin (INVOKANA) 300 MG TABS tablet Take 300 mg by mouth daily before breakfast.  . [DISCONTINUED] docusate sodium (COLACE) 100 MG capsule Take 200 mg by mouth daily.  . [DISCONTINUED] Ferrous Fumarate (FERROCITE) 324 (106 Fe) MG TABS tablet Take 1 tablet by mouth 2 (two) times daily.  . [DISCONTINUED] insulin glargine (LANTUS) 100 UNIT/ML injection Inject into the skin at bedtime. 23 units at night  . [DISCONTINUED] metFORMIN (GLUCOPHAGE) 1000 MG tablet Take 1,000 mg by mouth 2 (two) times daily with a meal.   No facility-administered encounter medications on file as of 04/26/2018.     ALLERGIES: Allergies  Allergen Reactions  . Glipizide Nausea Only, Other (See Comments) and Rash    Cold sweats   . Codeine     Dizzy, nausea, cold sweat  . Plavix [Clopidogrel Bisulfate]     SOB  . Protonix [Pantoprazole Sodium]     ? nausea  . Sporanox [Itraconazole]     Major rash    VACCINATION STATUS:  There is no immunization history on file for this patient.  Diabetes  She presents for her initial diabetic visit. She has type 2 diabetes mellitus. Onset time: She was diagnosed at approximate age of 59. Her disease course has been worsening. There are no hypoglycemic associated symptoms. Pertinent negatives for hypoglycemia include no confusion, headaches, pallor or seizures. Associated symptoms include blurred vision, fatigue, foot paresthesias, polydipsia and polyuria. Pertinent negatives for diabetes include no chest pain and no polyphagia. There are no hypoglycemic complications. Symptoms are worsening. Diabetic complications include peripheral neuropathy. (Chronic heavy smoking.) Risk factors for coronary artery disease include diabetes mellitus, dyslipidemia, family history, hypertension, sedentary lifestyle, tobacco exposure and post-menopausal. Current diabetic treatments: She is taking Basaglar 28 units nightly, Synjardy  12.12/998 mg p.o. daily, as well as Humulin are 2-10 units 3 times a day with meals. Her weight is fluctuating minimally. She is following a generally unhealthy diet. When asked about meal planning, she reported none. She has not had a previous visit with a dietitian (She declined referral.).  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  Cannot exercise optimally.) participates in exercise. (She brought a log which shows significantly above target glycemic profile both fasting and postprandial.  Her most recent A1c was 11.8% on January 30, 2018.) An ACE inhibitor/angiotensin II receptor blocker is not being taken. Eye exam is current.  Hyperlipidemia  This is a chronic problem. The current episode started more than 1 year ago. The problem is controlled. Exacerbating diseases include diabetes. Pertinent negatives include no chest pain, myalgias or shortness of breath. Current antihyperlipidemic treatment includes statins. Risk factors for coronary artery disease include diabetes mellitus, dyslipidemia, family history, hypertension, a sedentary lifestyle and post-menopausal.  Hypertension  This is a chronic problem. The current episode started more than 1 year ago. Associated symptoms include blurred vision. Pertinent negatives include no chest pain, headaches, palpitations or shortness of breath. Risk factors for coronary artery disease include dyslipidemia, diabetes mellitus, sedentary lifestyle, smoking/tobacco exposure and family history. Past treatments include beta blockers.      Review of Systems  Constitutional: Positive for fatigue. Negative for chills, fever and unexpected weight change.  HENT: Negative for trouble swallowing and voice change.   Eyes: Positive for blurred vision. Negative for visual disturbance.  Respiratory: Positive for cough. Negative for shortness of breath and wheezing.   Cardiovascular: Negative for chest pain, palpitations and leg swelling.  Gastrointestinal:  Negative for diarrhea, nausea and vomiting.  Endocrine: Positive for polydipsia and polyuria. Negative for cold intolerance, heat intolerance and polyphagia.  Musculoskeletal: Negative for arthralgias and myalgias.  Skin: Negative for color change, pallor, rash and wound.  Neurological: Negative for seizures and headaches.  Psychiatric/Behavioral: Positive for dysphoric mood. Negative for confusion and suicidal ideas.    Objective:    BP 105/66   Pulse 88   Ht 5\' 3"  (1.6 m)   Wt 151 lb (68.5 kg)   BMI 26.75 kg/m   Wt Readings from Last 3 Encounters:  04/26/18 151 lb (68.5 kg)  09/29/16 147 lb 12.8 oz (67 kg)  10/23/14 148 lb 12.8 oz (67.5 kg)     Physical Exam  Constitutional: She is oriented to person, place, and time. She appears well-developed.  HENT:  Head: Normocephalic and atraumatic.  Eyes: EOM are normal.  Neck: Normal range of motion. Neck supple. No tracheal deviation present. No thyromegaly present.  Cardiovascular: Normal rate and regular rhythm.  Pulmonary/Chest: Effort normal. She has wheezes. She has rales.  Abdominal: Soft. Bowel sounds are normal. There is no tenderness. There is no guarding.  Musculoskeletal: Normal range of motion. She exhibits no edema.  Neurological: She is alert and oriented to person, place, and time. She has normal reflexes. No cranial nerve deficit. Coordination normal.  Skin: Skin is warm and dry. No rash noted. No erythema. No pallor.  Psychiatric: She has a normal mood and affect. Judgment normal.      CMP ( most recent) CMP     Component Value Date/Time   BUN 12 01/30/2018   CREATININE 0.7 01/30/2018     Diabetic Labs (most recent): Lab Results  Component Value Date   HGBA1C 11.8 01/30/2018     Lipid Panel ( most recent) Lipid Panel     Component Value Date/Time   CHOL 125 01/30/2018   TRIG 122 01/30/2018   HDL 50 01/30/2018   LDLCALC 51 01/30/2018      Lab Results  Component Value Date   TSH 1.30  01/30/2018     Assessment & Plan:  1. Uncontrolled type 2 diabetes mellitus with hyperglycemia (HCC)  - Lakyla Biswas has currently uncontrolled symptomatic type 2 DM since 62 years of age,  with most recent A1c of 11.8 %. Recent labs reviewed.  -her diabetes is complicated by peripheral neuropathy and she remains at extremely high risk for more acute and chronic complications which include CAD, CVA, CKD, retinopathy, and neuropathy. These are all discussed in detail with her.  - I have counseled her on diet management and weight loss, by adopting a carbohydrate restricted/protein rich diet.  - Suggestion is made for her to avoid simple carbohydrates  from her diet including Cakes, Sweet Desserts, Ice Cream, Soda (diet and regular), Sweet Tea, Candies, Chips, Cookies, Store Bought Juices, Alcohol in Excess of  1-2 drinks a day, Artificial Sweeteners,  Coffee Creamer, and "Sugar-free" Products. This will help patient to have more stable blood glucose profile and potentially avoid unintended weight gain.  - I encouraged her to switch to  unprocessed or minimally processed complex starch and increased protein intake (animal or plant source), fruits, and vegetables.  - she is advised to stick to a routine mealtimes to eat 3 meals  a day and avoid unnecessary snacks ( to snack only to correct hypoglycemia).   - she will be scheduled with Norm Salt, RDN, CDE for individualized diabetes education.  - I have approached her with the following individualized plan to manage diabetes and patient agrees:   -Given her current and prevailing glycemic burden, she will continue to require intensive treatment with basal/bolus insulin in order for her to achieve and maintain control of diabetes to target.   -I approached her to increase her basal insulin Basaglar to 40 units daily at bedtime , and increase her prandial insulin regular insulin (this will be changed to NovoLog or Humalog during her next  visit) to 5 units 3 times a day with meals  for pre-meal BG readings of 90-150mg /dl, plus patient specific correction dose for unexpected hyperglycemia above 150mg /dl, associated with strict monitoring of glucose 4 times a day-before meals and at bedtime. - Patient is warned not to take insulin without proper monitoring per orders. -Adjustment parameters are given for hypo and hyperglycemia in writing. - she is encouraged to call clinic for blood glucose levels less than 70 or above 300 mg /dl. - I will discontinue Synjardy, resume metformin 1000 mg ER p.o. daily after breakfast,  therapeutically suitable for patient .  - she is not a candidate for incretin therapy, SGLT2 inhibitors .  - Patient specific target  A1c;  LDL, HDL, Triglycerides, and  Waist Circumference were discussed in detail.  2) BP/HTN:  her blood pressure is  controlled to target at blood pressure.   she is advised to continue her current medications including carvedilol 3.125 mg p.o. twice with breakfast . 3) Lipids/HPL:   Review of her recent lipid panel showed LDL controlled at 51.   she  is advised to continue patient is advised to continue Lipitor 80 mg mg daily at bedtime.  Side effects and precautions discussed with her.  4)  Weight/Diet:  Body mass index is 26.75 kg/m.  - clearly complicating her diabetes care. CDE Consult will be initiated . Exercise, and detailed carbohydrates information provided  -  detailed on discharge instructions.  5) Chronic Care/Health Maintenance:  -she  Is on  Statin medications and  is encouraged to initiate and continue to follow up with Ophthalmology, Dentist, pulmonology given her oxygen requiring COPD, podiatrist at  least yearly or according to recommendations, and advised to quit smoking (this patient smoked 60+ pack year starting from age 69). I have recommended yearly flu vaccine and pneumonia vaccine at least every 5 years; moderate intensity exercise for up to 150 minutes weekly; and   sleep for at least 7 hours a day.  - I advised patient to maintain close follow up with Virgina Norfolk, MD for primary care needs.  - Time spent with the patient: 45 minutes, of which >50% was spent in obtaining information about her symptoms, reviewing her previous labs, evaluations, and treatments, counseling her about her currently uncontrolled and complicated type 2 diabetes, hyperlipidemia, hypertension, and developing developing  plans for long term treatment based on the latest recommendations.  [name] participated in the discussions, expressed understanding, and voiced agreement with the above plans.  All questions were answered to her satisfaction. she is encouraged to contact clinic should she have any questions or concerns prior to her return visit.  Follow up plan: - Return in about 2 weeks (around 05/10/2018) for Meter, and Logs.  Marquis Lunch, MD [facility] [address] [place], [place] [address] Phone: [phone]  Fax: [phone]    04/26/2018, 4:28 PM  This note was partially dictated with voice recognition software. Similar sounding words can be transcribed inadequately or may not  be corrected upon review.

## 2018-04-26 NOTE — Patient Instructions (Signed)

## 2018-05-11 ENCOUNTER — Ambulatory Visit: Payer: Federal, State, Local not specified - PPO | Admitting: "Endocrinology

## 2018-05-17 ENCOUNTER — Other Ambulatory Visit: Payer: Self-pay | Admitting: "Endocrinology

## 2018-05-18 LAB — CMP14+EGFR
A/G RATIO: 0.9 — AB (ref 1.2–2.2)
ALBUMIN: 3.7 g/dL (ref 3.6–4.8)
ALK PHOS: 117 IU/L (ref 39–117)
ALT: 18 IU/L (ref 0–32)
AST: 29 IU/L (ref 0–40)
BILIRUBIN TOTAL: 0.2 mg/dL (ref 0.0–1.2)
BUN / CREAT RATIO: 20 (ref 12–28)
BUN: 14 mg/dL (ref 8–27)
CHLORIDE: 94 mmol/L — AB (ref 96–106)
CO2: 26 mmol/L (ref 20–29)
Calcium: 9.6 mg/dL (ref 8.7–10.3)
Creatinine, Ser: 0.71 mg/dL (ref 0.57–1.00)
GFR calc non Af Amer: 92 mL/min/{1.73_m2} (ref >59)
GFR, EST AFRICAN AMERICAN: 106 mL/min/{1.73_m2} (ref >59)
GLUCOSE: 138 mg/dL — AB (ref 65–99)
Globulin, Total: 4 g/dL (ref 1.5–4.5)
POTASSIUM: 4.7 mmol/L (ref 3.5–5.2)
Sodium: 135 mmol/L (ref 134–144)
TOTAL PROTEIN: 7.7 g/dL (ref 6.0–8.5)

## 2018-05-18 LAB — MICROALBUMIN / CREATININE URINE RATIO
CREATININE, UR: 12.1 mg/dL
Microalb/Creat Ratio: 234.7 mg/g creat — ABNORMAL HIGH (ref 0.0–30.0)
Microalbumin, Urine: 28.4 ug/mL

## 2018-05-18 LAB — HGB A1C W/O EAG: HEMOGLOBIN A1C: 11 % — AB (ref 4.8–5.6)

## 2018-05-25 ENCOUNTER — Encounter: Payer: Self-pay | Admitting: "Endocrinology

## 2018-05-25 ENCOUNTER — Ambulatory Visit (INDEPENDENT_AMBULATORY_CARE_PROVIDER_SITE_OTHER): Payer: Federal, State, Local not specified - PPO | Admitting: "Endocrinology

## 2018-05-25 VITALS — BP 138/75 | HR 88 | Ht 63.0 in | Wt 157.0 lb

## 2018-05-25 DIAGNOSIS — F17200 Nicotine dependence, unspecified, uncomplicated: Secondary | ICD-10-CM | POA: Diagnosis not present

## 2018-05-25 DIAGNOSIS — I10 Essential (primary) hypertension: Secondary | ICD-10-CM | POA: Diagnosis not present

## 2018-05-25 DIAGNOSIS — E782 Mixed hyperlipidemia: Secondary | ICD-10-CM | POA: Diagnosis not present

## 2018-05-25 DIAGNOSIS — E1165 Type 2 diabetes mellitus with hyperglycemia: Secondary | ICD-10-CM

## 2018-05-25 NOTE — Patient Instructions (Signed)

## 2018-05-25 NOTE — Progress Notes (Signed)
Endocrinology Consult Note       05/25/2018, 4:57 PM   Subjective:    Patient ID: Dana Dominguez, female    DOB: [birth year].  Dana Dominguez is being seen in consultation for management of currently uncontrolled symptomatic diabetes requested by  Dana Norfolk, MD.   Past Medical History:  Diagnosis Date  . Back pain   . Diabetes (HCC)   . Dysphagia 09/29/2016  . GERD (gastroesophageal reflux disease)   . Heart murmur   . High cholesterol   . On supplemental oxygen therapy    3L [place]  at night   Past Surgical History:  Procedure Laterality Date  . BACK SURGERY     x 2 in the past  . carotid surgery     December 2015 rt   . CHOLECYSTECTOMY     [facility]  . COLON SURGERY     for diverticulitis  . ESOPHAGEAL DILATION N/A 11/21/2014   Procedure: ESOPHAGEAL DILATION;  Surgeon: Malissa Hippo, MD;  Location: [facility];  Service: Endoscopy;  Laterality: N/A;  . ESOPHAGOGASTRODUODENOSCOPY N/A 11/21/2014   Procedure: ESOPHAGOGASTRODUODENOSCOPY (EGD);  Surgeon: Malissa Hippo, MD;  Location: [facility];  Service: Endoscopy;  Laterality: N/A;  1200 - moved to 4/7 @ 9:00  . NASAL SINUS SURGERY    . NECK SURGERY    . stent in left arm     blockage 3 yrs ago.    Social History   Socioeconomic History  . Marital status: Married    Spouse name: Not on file  . Number of children: Not on file  . Years of education: Not on file  . Highest education level: Not on file  Occupational History  . Not on file  Social Needs  . Financial resource strain: Not on file  . Food insecurity:    Worry: Not on file    Inability: Not on file  . Transportation needs:    Medical: Not on file    Non-medical: Not on file  Tobacco Use  . Smoking status: Current Every Day Smoker    Packs/day: 2.00    Years: 50.00    Pack years: 100.00  . Smokeless tobacco: Never Used  . Tobacco comment: 1 pack day since 12 yrs   Substance and Sexual Activity  . Alcohol use: No    Alcohol/week: 0.0 standard drinks  . Drug use: No  . Sexual activity: Not on file  Lifestyle  . Physical activity:    Days per week: Not on file    Minutes per session: Not on file  . Stress: Not on file  Relationships  . Social connections:    Talks on phone: Not on file    Gets together: Not on file    Attends religious service: Not on file    Active member of club or organization: Not on file    Attends meetings of clubs or organizations: Not on file    Relationship status: Not on file  Other Topics Concern  . Not on file  Social History Narrative  . Not on file   Outpatient Encounter Medications as of  05/25/2018  Medication Sig  . ANORO ELLIPTA 62.5-25 MCG/INH AEPB daily.  . ARIPiprazole (ABILIFY) 5 MG tablet Take 5 mg by mouth daily.  Marland Kitchen aspirin EC 81 MG tablet Take 81 mg by mouth daily.  Marland Kitchen atorvastatin (LIPITOR) 80 MG tablet Take 80 mg by mouth daily.   . busPIRone (BUSPAR) 15 MG tablet Take 15 mg by mouth 3 (three) times daily.  . carvedilol (COREG) 3.125 MG tablet Take 3.125 mg by mouth daily.  Marland Kitchen desloratadine (CLARINEX) 5 MG tablet Take 5 mg by mouth daily.  Marland Kitchen dexlansoprazole (DEXILANT) 60 MG capsule Take 60 mg by mouth daily.  Marland Kitchen dicyclomine (BENTYL) 10 MG capsule Take 10 mg by mouth 2 (two) times daily.  . DULoxetine (CYMBALTA) 60 MG capsule Take 60 mg by mouth 2 (two) times daily.  . hydrOXYzine (ATARAX/VISTARIL) 50 MG tablet Take 50 mg by mouth 2 (two) times daily.  . Insulin Glargine (BASAGLAR KWIKPEN) 100 UNIT/ML SOPN Inject 60 Units into the skin at bedtime.  . insulin regular (NOVOLIN R,HUMULIN R) 100 units/mL injection Inject 10-16 Units into the skin 3 (three) times daily before meals. Sliding scale   . metFORMIN (GLUCOPHAGE-XR) 500 MG 24 hr tablet TAKE 2 TABLETS (1000MG  DOSE) BY MOUTH ONCE DAILY WITH BREAKFAST  . traZODone (DESYREL) 100 MG tablet Take 100 mg by mouth at bedtime.    No  facility-administered encounter medications on file as of 05/25/2018.     ALLERGIES: Allergies  Allergen Reactions  . Glipizide Nausea Only, Other (See Comments) and Rash    Cold sweats   . Codeine     Dizzy, nausea, cold sweat  . Plavix [Clopidogrel Bisulfate]     SOB  . Protonix [Pantoprazole Sodium]     ? nausea  . Sporanox [Itraconazole]     Major rash    VACCINATION STATUS:  There is no immunization history on file for this patient.  Diabetes  She presents for her follow-up diabetic visit. She has type 2 diabetes mellitus. Onset time: She was diagnosed at approximate age of 37. Her disease course has been worsening. There are no hypoglycemic associated symptoms. Pertinent negatives for hypoglycemia include no confusion, headaches, pallor or seizures. Associated symptoms include blurred vision, fatigue, foot paresthesias, polydipsia and polyuria. Pertinent negatives for diabetes include no chest pain and no polyphagia. There are no hypoglycemic complications. Symptoms are worsening. Diabetic complications include peripheral neuropathy. (Chronic heavy smoking.) Risk factors for coronary artery disease include diabetes mellitus, dyslipidemia, family history, hypertension, sedentary lifestyle, tobacco exposure and post-menopausal. Current diabetic treatments: She is taking Basaglar 28 units nightly, Synjardy 12.12/998 mg p.o. daily, as well as Humulin are 2-10 units 3 times a day with meals. Her weight is increasing steadily. She is following a generally unhealthy diet. When asked about meal planning, she reported none. She has not had a previous visit with a dietitian (She declined referral.). 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  Cannot exercise optimally.) participates in exercise. Her breakfast blood glucose range is generally >200 mg/dl. [name] blood glucose range is generally >200 mg/dl. Her dinner blood glucose range is generally >200 mg/dl. Her bedtime blood  glucose range is generally >200 mg/dl. Her overall blood glucose range is >200 mg/dl. An ACE inhibitor/angiotensin II receptor blocker is not being taken. Eye exam is current.  Hyperlipidemia  This is a chronic problem. The current episode started more than 1 year ago. The problem is controlled. Exacerbating diseases include diabetes. Pertinent negatives include no chest pain,  myalgias or shortness of breath. Current antihyperlipidemic treatment includes statins. Risk factors for coronary artery disease include diabetes mellitus, dyslipidemia, family history, hypertension, a sedentary lifestyle and post-menopausal.  Hypertension  This is a chronic problem. The current episode started more than 1 year ago. Associated symptoms include blurred vision. Pertinent negatives include no chest pain, headaches, palpitations or shortness of breath. Risk factors for coronary artery disease include dyslipidemia, diabetes mellitus, sedentary lifestyle, smoking/tobacco exposure and family history. Past treatments include beta blockers.      Review of Systems  Constitutional: Positive for fatigue. Negative for chills, fever and unexpected weight change.  HENT: Negative for trouble swallowing and voice change.   Eyes: Positive for blurred vision. Negative for visual disturbance.  Respiratory: Positive for cough. Negative for shortness of breath and wheezing.   Cardiovascular: Negative for chest pain, palpitations and leg swelling.  Gastrointestinal: Negative for diarrhea, nausea and vomiting.  Endocrine: Positive for polydipsia and polyuria. Negative for cold intolerance, heat intolerance and polyphagia.  Musculoskeletal: Negative for arthralgias and myalgias.  Skin: Negative for color change, pallor, rash and wound.  Neurological: Negative for seizures and headaches.  Psychiatric/Behavioral: Positive for dysphoric mood. Negative for confusion and suicidal ideas.    Objective:    BP 138/75   Pulse 88   Ht  5\' 3"  (1.6 m)   Wt 157 lb (71.2 kg)   BMI 27.81 kg/m   Wt Readings from Last 3 Encounters:  05/25/18 157 lb (71.2 kg)  04/26/18 151 lb (68.5 kg)  09/29/16 147 lb 12.8 oz (67 kg)     Physical Exam  Constitutional: She is oriented to person, place, and time. She appears well-developed.  HENT:  Head: Normocephalic and atraumatic.  Eyes: EOM are normal.  Neck: Normal range of motion. Neck supple. No tracheal deviation present. No thyromegaly present.  Cardiovascular: Normal rate and regular rhythm.  Pulmonary/Chest: Effort normal. She has wheezes. She has rales.  Abdominal: Soft. Bowel sounds are normal. There is no tenderness. There is no guarding.  Musculoskeletal: Normal range of motion. She exhibits no edema.  Neurological: She is alert and oriented to person, place, and time. She has normal reflexes. No cranial nerve deficit. Coordination normal.  Skin: Skin is warm and dry. No rash noted. No erythema. No pallor.  Psychiatric: She has a normal mood and affect. Judgment normal.     CMP     Component Value Date/Time   NA 135 05/17/2018 1430   K 4.7 05/17/2018 1430   CL 94 (L) 05/17/2018 1430   CO2 26 05/17/2018 1430   GLUCOSE 138 (H) 05/17/2018 1430   BUN 14 05/17/2018 1430   CREATININE 0.71 05/17/2018 1430   CALCIUM 9.6 05/17/2018 1430   PROT 7.7 05/17/2018 1430   ALBUMIN 3.7 05/17/2018 1430   AST 29 05/17/2018 1430   ALT 18 05/17/2018 1430   ALKPHOS 117 05/17/2018 1430   BILITOT 0.2 05/17/2018 1430   GFRNONAA 92 05/17/2018 1430   GFRAA 106 05/17/2018 1430     Diabetic Labs (most recent): Lab Results  Component Value Date   HGBA1C 11.0 (H) 05/17/2018   HGBA1C 11.8 01/30/2018     Lipid Panel ( most recent) Lipid Panel     Component Value Date/Time   CHOL 125 01/30/2018   TRIG 122 01/30/2018   HDL 50 01/30/2018   LDLCALC 51 01/30/2018      Lab Results  Component Value Date   TSH 1.30 01/30/2018     Assessment & Plan:  1. Uncontrolled type 2  diabetes mellitus with hyperglycemia (HCC)  - Dana Dominguez has currently uncontrolled symptomatic type 2 DM since 62 years of age. -She presents with still significantly above target glycemic profile both fasting and postprandial.  Her recent A1c is 11% largely unchanged from 11.8% prior to her last visit.  -Her recent labs are reviewed with her.  -her diabetes is complicated by peripheral neuropathy and she remains at extremely high risk for more acute and chronic complications which include CAD, CVA, CKD, retinopathy, and neuropathy. These are all discussed in detail with her.  - I have counseled her on diet management and weight loss, by adopting a carbohydrate restricted/protein rich diet.  -Still admits to dietary indiscretions including consumption of sweets and sweetened beverages.  -  Suggestion is made for her to avoid simple carbohydrates  from her diet including Cakes, Sweet Desserts / Pastries, Ice Cream, Soda (diet and regular), Sweet Tea, Candies, Chips, Cookies, Store Bought Juices, Alcohol in Excess of  1-2 drinks a day, Artificial Sweeteners, and "Sugar-free" Products. This will help patient to have stable blood glucose profile and potentially avoid unintended weight gain.   - I encouraged her to switch to  unprocessed or minimally processed complex starch and increased protein intake (animal or plant source), fruits, and vegetables.  - she is advised to stick to a routine mealtimes to eat 3 meals  a day and avoid unnecessary snacks ( to snack only to correct hypoglycemia).   - she will be scheduled with Norm Salt, RDN, CDE for individualized diabetes education.  - I have approached her with the following individualized plan to manage diabetes and patient agrees:   -Given her current and prevailing glycemic burden, she will continue to require intensive treatment with higher dose of basal/bolus insulin in order for her to achieve and maintain control of diabetes to target.    -She is approached to increase Basaglar to 60 units daily at bedtime , and increase her prandial insulin regular insulin (this will be changed to NovoLog or Humalog during her next visit) to 10 units 3 times a day with meals  for pre-meal BG readings of 90-150mg /dl, plus patient specific correction dose for unexpected hyperglycemia above 150mg /dl, associated with strict monitoring of glucose 4 times a day-before meals and at bedtime. - Patient is warned not to take insulin without proper monitoring per orders. -Adjustment parameters are given for hypo and hyperglycemia in writing. - she is encouraged to call clinic for blood glucose levels less than 70 or above 300 mg /dl. -She is advised to continue metformin 1000 mg ER once a day after breakfast,  therapeutically suitable for patient .  - she is not a candidate for incretin therapy, SGLT2 inhibitors .  Is chronic heavy smoker.  - Patient specific target  A1c;  LDL, HDL, Triglycerides, and  Waist Circumference were discussed in detail.  2) BP/HTN:  her blood pressure is  controlled to target at blood pressure.   she is advised to continue her current medications including carvedilol 3.125 mg p.o. twice with breakfast . 3) Lipids/HPL:   Review of her recent lipid panel showed LDL controlled at 51.   she  is advised to continue patient is advised to continue Lipitor 80 mg mg daily at bedtime.  Side effects and precautions discussed with her.  4)  Weight/Diet:  Body mass index is 27.81 kg/m.  - clearly complicating her diabetes care. CDE Consult will be initiated . Exercise, and detailed carbohydrates  information provided  -  detailed on discharge instructions.  5) Chronic Care/Health Maintenance:  -she  Is on  Statin medications and  is encouraged to initiate and continue to follow up with Ophthalmology, Dentist, pulmonology given her oxygen requiring COPD, podiatrist at least yearly or according to recommendations, and advised to quit smoking  (this patient smoked 60+ pack year starting from age 34). I have recommended yearly flu vaccine and pneumonia vaccine at least every 5 years; moderate intensity exercise for up to 150 minutes weekly; and  sleep for at least 7 hours a day.  - I advised patient to maintain close follow up with Dana Norfolk, MD for primary care needs.  - Time spent with the patient: 25 min, of which >50% was spent in reviewing her blood glucose logs , discussing her hypo- and hyper-glycemic episodes, reviewing her current and  previous labs and insulin doses and developing a plan to avoid hypo- and hyper-glycemia. Please refer to Patient Instructions for Blood Glucose Monitoring and Insulin/Medications Dosing Guide"  in media tab for additional information. Dana Dominguez participated in the discussions, expressed understanding, and voiced agreement with the above plans.  All questions were answered to her satisfaction. she is encouraged to contact clinic should she have any questions or concerns prior to her return visit.  Follow up plan: - Return in about 3 months (around 08/25/2018) for Follow up with Pre-visit Labs, Meter, and Logs.  Dana Lunch, MD [facility] [address] [place], [place] [address] Phone: [phone]  Fax: [phone]    05/25/2018, 4:57 PM  This note was partially dictated with voice recognition software. Similar sounding words can be transcribed inadequately or may not  be corrected upon review.

## 2018-07-08 ENCOUNTER — Other Ambulatory Visit: Payer: Self-pay | Admitting: "Endocrinology

## 2018-07-18 ENCOUNTER — Ambulatory Visit: Payer: Federal, State, Local not specified - PPO | Admitting: Nutrition

## 2018-08-21 ENCOUNTER — Other Ambulatory Visit: Payer: Self-pay

## 2018-08-21 ENCOUNTER — Telehealth: Payer: Self-pay

## 2018-08-21 DIAGNOSIS — E1165 Type 2 diabetes mellitus with hyperglycemia: Secondary | ICD-10-CM

## 2018-08-21 MED ORDER — BASAGLAR KWIKPEN 100 UNIT/ML ~~LOC~~ SOPN: 60.0000 [IU] | PEN_INJECTOR | Freq: Every day | SUBCUTANEOUS | 1 refills | Status: DC

## 2018-08-21 NOTE — Telephone Encounter (Signed)
SIGNED

## 2018-08-22 ENCOUNTER — Other Ambulatory Visit: Payer: Self-pay | Admitting: "Endocrinology

## 2018-08-23 LAB — HGB A1C W/O EAG: Hgb A1c MFr Bld: 9.5 % — ABNORMAL HIGH (ref 4.8–5.6)

## 2018-08-23 LAB — CMP14+EGFR
ALBUMIN: 3.7 g/dL (ref 3.6–4.8)
ALT: 12 IU/L (ref 0–32)
AST: 23 IU/L (ref 0–40)
Albumin/Globulin Ratio: 1 — ABNORMAL LOW (ref 1.2–2.2)
Alkaline Phosphatase: 119 IU/L — ABNORMAL HIGH (ref 39–117)
BILIRUBIN TOTAL: 0.3 mg/dL (ref 0.0–1.2)
BUN / CREAT RATIO: 11 — AB (ref 12–28)
BUN: 8 mg/dL (ref 8–27)
CHLORIDE: 91 mmol/L — AB (ref 96–106)
CO2: 25 mmol/L (ref 20–29)
CREATININE: 0.74 mg/dL (ref 0.57–1.00)
Calcium: 9.1 mg/dL (ref 8.7–10.3)
GFR, EST AFRICAN AMERICAN: 100 mL/min/{1.73_m2} (ref >59)
GFR, EST NON AFRICAN AMERICAN: 87 mL/min/{1.73_m2} (ref >59)
GLUCOSE: 200 mg/dL — AB (ref 65–99)
Globulin, Total: 3.8 g/dL (ref 1.5–4.5)
Potassium: 4.6 mmol/L (ref 3.5–5.2)
Sodium: 131 mmol/L — ABNORMAL LOW (ref 134–144)
TOTAL PROTEIN: 7.5 g/dL (ref 6.0–8.5)

## 2018-08-29 ENCOUNTER — Ambulatory Visit (INDEPENDENT_AMBULATORY_CARE_PROVIDER_SITE_OTHER): Payer: Federal, State, Local not specified - PPO | Admitting: "Endocrinology

## 2018-08-29 ENCOUNTER — Encounter: Payer: Self-pay | Admitting: "Endocrinology

## 2018-08-29 VITALS — BP 107/70 | HR 101 | Ht 63.0 in | Wt 175.0 lb

## 2018-08-29 DIAGNOSIS — F17200 Nicotine dependence, unspecified, uncomplicated: Secondary | ICD-10-CM

## 2018-08-29 DIAGNOSIS — E1165 Type 2 diabetes mellitus with hyperglycemia: Secondary | ICD-10-CM | POA: Diagnosis not present

## 2018-08-29 DIAGNOSIS — I10 Essential (primary) hypertension: Secondary | ICD-10-CM

## 2018-08-29 DIAGNOSIS — E782 Mixed hyperlipidemia: Secondary | ICD-10-CM

## 2018-08-29 MED ORDER — INSULIN ASPART 100 UNIT/ML FLEXPEN: 10.0000 [IU] | PEN_INJECTOR | Freq: Three times a day (TID) | SUBCUTANEOUS | 2 refills | Status: DC

## 2018-08-29 MED ORDER — BASAGLAR KWIKPEN 100 UNIT/ML ~~LOC~~ SOPN: 70.0000 [IU] | PEN_INJECTOR | Freq: Every day | SUBCUTANEOUS | 1 refills | Status: DC

## 2018-08-29 NOTE — Progress Notes (Signed)
Endocrinology follow-up  Note       08/29/2018, 2:54 PM   Subjective:    Patient ID: Dana Dominguez, female    DOB: 09/20/1955.  Dana Dominguez is being seen in follow-up for management of currently uncontrolled type 2 diabetes, hyperlipidemia, hypertension. PCP:   Virgina Norfolk, MD.   Past Medical History:  Diagnosis Date  . Back pain   . Diabetes (HCC)   . Dysphagia 09/29/2016  . GERD (gastroesophageal reflux disease)   . Heart murmur   . High cholesterol   . On supplemental oxygen therapy    3L [place]  at night   Past Surgical History:  Procedure Laterality Date  . BACK SURGERY     x 2 in the past  . carotid surgery     December 2015 rt   . CHOLECYSTECTOMY     [facility]  . COLON SURGERY     for diverticulitis  . ESOPHAGEAL DILATION N/A 11/21/2014   Procedure: ESOPHAGEAL DILATION;  Surgeon: Malissa Hippo, MD;  Location: [facility];  Service: Endoscopy;  Laterality: N/A;  . ESOPHAGOGASTRODUODENOSCOPY N/A 11/21/2014   Procedure: ESOPHAGOGASTRODUODENOSCOPY (EGD);  Surgeon: Malissa Hippo, MD;  Location: [facility];  Service: Endoscopy;  Laterality: N/A;  1200 - moved to 4/7 @ 9:00  . NASAL SINUS SURGERY    . NECK SURGERY    . stent in left arm     blockage 3 yrs ago.    Social History   Socioeconomic History  . Marital status: Married    Spouse name: Not on file  . Number of children: Not on file  . Years of education: Not on file  . Highest education level: Not on file  Occupational History  . Not on file  Social Needs  . Financial resource strain: Not on file  . Food insecurity:    Worry: Not on file    Inability: Not on file  . Transportation needs:    Medical: Not on file    Non-medical: Not on file  Tobacco Use  . Smoking status: Current Every Day Smoker    Packs/day: 2.00    Years: 50.00    Pack years: 100.00  . Smokeless tobacco: Never Used  . Tobacco comment: 1 pack day  since 12 yrs  Substance and Sexual Activity  . Alcohol use: No    Alcohol/week: 0.0 standard drinks  . Drug use: No  . Sexual activity: Not on file  Lifestyle  . Physical activity:    Days per week: Not on file    Minutes per session: Not on file  . Stress: Not on file  Relationships  . Social connections:    Talks on phone: Not on file    Gets together: Not on file    Attends religious service: Not on file    Active member of club or organization: Not on file    Attends meetings of clubs or organizations: Not on file    Relationship status: Not on file  Other Topics Concern  . Not on file  Social History Narrative  . Not on file   Outpatient  Encounter Medications as of 08/29/2018  Medication Sig  . ANORO ELLIPTA 62.5-25 MCG/INH AEPB daily.  . ARIPiprazole (ABILIFY) 5 MG tablet Take 5 mg by mouth daily.  Marland Kitchen. aspirin EC 81 MG tablet Take 81 mg by mouth daily.  Marland Kitchen. atorvastatin (LIPITOR) 80 MG tablet Take 80 mg by mouth daily.   . busPIRone (BUSPAR) 15 MG tablet Take 15 mg by mouth 3 (three) times daily.  . carvedilol (COREG) 3.125 MG tablet Take 3.125 mg by mouth daily.  Marland Kitchen. desloratadine (CLARINEX) 5 MG tablet Take 5 mg by mouth daily.  Marland Kitchen. dexlansoprazole (DEXILANT) 60 MG capsule Take 60 mg by mouth daily.  Marland Kitchen. dicyclomine (BENTYL) 10 MG capsule Take 10 mg by mouth 2 (two) times daily.  . DULoxetine (CYMBALTA) 60 MG capsule Take 60 mg by mouth 2 (two) times daily.  . hydrOXYzine (ATARAX/VISTARIL) 50 MG tablet Take 50 mg by mouth 2 (two) times daily.  . insulin aspart (NOVOLOG FLEXPEN) 100 UNIT/ML FlexPen Inject 10-16 Units into the skin 3 (three) times daily before meals.  . Insulin Glargine (BASAGLAR KWIKPEN) 100 UNIT/ML SOPN Inject 0.7 mLs (70 Units total) into the skin at bedtime.  . metFORMIN (GLUCOPHAGE-XR) 500 MG 24 hr tablet TAKE 2 TABLETS (1000MG  DOSE) BY MOUTH ONCE DAILY WITH BREAKFAST  . metFORMIN (GLUCOPHAGE-XR) 500 MG 24 hr tablet TAKE 2 TABLETS (1000MG  DOSE) BY MOUTH ONCE  DAILY WITH BREAKFAST  . traZODone (DESYREL) 100 MG tablet Take 100 mg by mouth at bedtime.   . [DISCONTINUED] Insulin Glargine (BASAGLAR KWIKPEN) 100 UNIT/ML SOPN Inject 0.6 mLs (60 Units total) into the skin at bedtime.  . [DISCONTINUED] insulin regular (NOVOLIN R,HUMULIN R) 100 units/mL injection Inject 10-16 Units into the skin 3 (three) times daily before meals. Sliding scale    No facility-administered encounter medications on file as of 08/29/2018.     ALLERGIES: Allergies  Allergen Reactions  . Glipizide Nausea Only, Other (See Comments) and Rash    Cold sweats   . Codeine     Dizzy, nausea, cold sweat  . Plavix [Clopidogrel Bisulfate]     SOB  . Protonix [Pantoprazole Sodium]     ? nausea  . Sporanox [Itraconazole]     Major rash    VACCINATION STATUS:  There is no immunization history on file for this patient.  Diabetes  She presents for her follow-up diabetic visit. She has type 2 diabetes mellitus. Onset time: She was diagnosed at approximate age of 63. Her disease course has been improving. There are no hypoglycemic associated symptoms. Pertinent negatives for hypoglycemia include no confusion, headaches, pallor or seizures. Associated symptoms include blurred vision, fatigue, foot paresthesias, polydipsia and polyuria. Pertinent negatives for diabetes include no chest pain and no polyphagia. There are no hypoglycemic complications. Symptoms are improving. Diabetic complications include peripheral neuropathy. (Chronic heavy smoking.) Risk factors for coronary artery disease include diabetes mellitus, dyslipidemia, family history, hypertension, sedentary lifestyle, tobacco exposure and post-menopausal. Current diabetic treatments: She is taking Basaglar 28 units nightly, Synjardy 12.12/998 mg p.o. daily, as well as Humulin are 2-10 units 3 times a day with meals. Her weight is increasing steadily. She is following a generally unhealthy diet. When asked about meal planning, she  reported none. She has not had a previous visit with a dietitian (She declined referral.). 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  Cannot exercise optimally.) participates in exercise. Her breakfast blood glucose range is generally 180-200 mg/dl. Her lunch blood glucose range is generally 180-200 mg/dl. Her  dinner blood glucose range is generally 180-200 mg/dl. Her bedtime blood glucose range is generally 180-200 mg/dl. Her overall blood glucose range is 180-200 mg/dl. An ACE inhibitor/angiotensin II receptor blocker is not being taken. Eye exam is current.  Hyperlipidemia  This is a chronic problem. The current episode started more than 1 year ago. The problem is controlled. Exacerbating diseases include diabetes. Pertinent negatives include no chest pain, myalgias or shortness of breath. Current antihyperlipidemic treatment includes statins. Risk factors for coronary artery disease include diabetes mellitus, dyslipidemia, family history, hypertension, a sedentary lifestyle and post-menopausal.  Hypertension  This is a chronic problem. The current episode started more than 1 year ago. Associated symptoms include blurred vision. Pertinent negatives include no chest pain, headaches, palpitations or shortness of breath. Risk factors for coronary artery disease include dyslipidemia, diabetes mellitus, sedentary lifestyle, smoking/tobacco exposure and family history. Past treatments include beta blockers.     Review of Systems  Constitutional: Positive for fatigue. Negative for chills, fever and unexpected weight change.  HENT: Negative for trouble swallowing and voice change.   Eyes: Positive for blurred vision. Negative for visual disturbance.  Respiratory: Positive for cough. Negative for shortness of breath and wheezing.   Cardiovascular: Negative for chest pain, palpitations and leg swelling.  Gastrointestinal: Negative for diarrhea, nausea and vomiting.  Endocrine: Positive for  polydipsia and polyuria. Negative for cold intolerance, heat intolerance and polyphagia.  Musculoskeletal: Negative for arthralgias and myalgias.  Skin: Negative for color change, pallor, rash and wound.  Neurological: Negative for seizures and headaches.  Psychiatric/Behavioral: Positive for dysphoric mood. Negative for confusion and suicidal ideas.    Objective:    BP 107/70   Pulse (!) 101   Wt 175 lb (79.4 kg)   BMI 31.00 kg/m   Wt Readings from Last 3 Encounters:  08/29/18 175 lb (79.4 kg)  05/25/18 157 lb (71.2 kg)  04/26/18 151 lb (68.5 kg)     Physical Exam Constitutional:      Appearance: She is well-developed.  HENT:     Head: Normocephalic and atraumatic.     Comments: Poor dental condition, working with a dentist. Neck:     Musculoskeletal: Normal range of motion and neck supple.     Thyroid: No thyromegaly.     Trachea: No tracheal deviation.  Pulmonary:     Effort: Pulmonary effort is normal.     Breath sounds: No wheezing or rales.  Abdominal:     General: Bowel sounds are normal.     Palpations: Abdomen is soft.     Tenderness: There is no abdominal tenderness. There is no guarding.  Musculoskeletal: Normal range of motion.  Skin:    General: Skin is warm and dry.     Coloration: Skin is not pale.     Findings: No erythema or rash.  Neurological:     Mental Status: She is alert and oriented to person, place, and time.     Cranial Nerves: No cranial nerve deficit.     Coordination: Coordination normal.     Deep Tendon Reflexes: Reflexes are normal and symmetric.  Psychiatric:        Judgment: Judgment normal.      CMP     Component Value Date/Time   NA 131 (L) 08/22/2018 1147   K 4.6 08/22/2018 1147   CL 91 (L) 08/22/2018 1147   CO2 25 08/22/2018 1147   GLUCOSE 200 (H) 08/22/2018 1147   BUN 8 08/22/2018 1147   CREATININE 0.74 08/22/2018  1147   CALCIUM 9.1 08/22/2018 1147   PROT 7.5 08/22/2018 1147   ALBUMIN 3.7 08/22/2018 1147   AST 23  08/22/2018 1147   ALT 12 08/22/2018 1147   ALKPHOS 119 (H) 08/22/2018 1147   BILITOT 0.3 08/22/2018 1147   GFRNONAA 87 08/22/2018 1147   GFRAA 100 08/22/2018 1147     Diabetic Labs (most recent): Lab Results  Component Value Date   HGBA1C 9.5 (H) 08/22/2018   HGBA1C 11.0 (H) 05/17/2018   HGBA1C 11.8 01/30/2018     Lipid Panel ( most recent) Lipid Panel     Component Value Date/Time   CHOL 125 01/30/2018   TRIG 122 01/30/2018   HDL 50 01/30/2018   LDLCALC 51 01/30/2018      Lab Results  Component Value Date   TSH 1.30 01/30/2018     Assessment & Plan:   1. Uncontrolled type 2 diabetes mellitus with hyperglycemia (HCC)  - Sevana Seaward has currently uncontrolled symptomatic type 2 DM since 63 years of age. -She presents with improving glycemic profile, still significantly above target.  Her previsit labs show A1c of 9.5% improving from 11.8%.   -Her recent labs are reviewed with her.  -her diabetes is complicated by peripheral neuropathy and she remains at extremely high risk for more acute and chronic complications which include CAD, CVA, CKD, retinopathy, and neuropathy. These are all discussed in detail with her.  - I have counseled her on diet management and weight loss, by adopting a carbohydrate restricted/protein rich diet.  -Still admits to dietary indiscretions including consumption of sweets and sweetened beverages.  -  Suggestion is made for her to avoid simple carbohydrates  from her diet including Cakes, Sweet Desserts / Pastries, Ice Cream, Soda (diet and regular), Sweet Tea, Candies, Chips, Cookies, Store Bought Juices, Alcohol in Excess of  1-2 drinks a day, Artificial Sweeteners, and "Sugar-free" Products. This will help patient to have stable blood glucose profile and potentially avoid unintended weight gain.   - I encouraged her to switch to  unprocessed or minimally processed complex starch and increased protein intake (animal or plant source), fruits,  and vegetables.  - she is advised to stick to a routine mealtimes to eat 3 meals  a day and avoid unnecessary snacks ( to snack only to correct hypoglycemia).   - she will be scheduled with Norm Salt, RDN, CDE for individualized diabetes education.  - I have approached her with the following individualized plan to manage diabetes and patient agrees:   -Given her current and prevailing glycemic burden, she will continue to require intensive treatment with higher dose of basal/bolus insulin in order for her to achieve and maintain control of diabetes to target.   -She is advised to increase Basaglar to 70 units nightly, change her regular insulin to NovoLog 10 units  3 times a day with meals  for pre-meal BG readings of 90-150mg /dl, plus patient specific correction dose for unexpected hyperglycemia above 150mg /dl, associated with strict monitoring of glucose 4 times a day-before meals and at bedtime. - Patient is warned not to take insulin without proper monitoring per orders. -Adjustment parameters are given for hypo and hyperglycemia in writing. - she is encouraged to call clinic for blood glucose levels less than 70 or above 300 mg /dl. -She is advised to continue metformin 1000 mg ER once a day after breakfast,  therapeutically suitable for patient .  - she is not a candidate for incretin therapy, SGLT2 inhibitors .  Is chronic heavy smoker.  - Patient specific target  A1c;  LDL, HDL, Triglycerides, and  Waist Circumference were discussed in detail.  2) BP/HTN:  her blood pressure is controlled to target.    she is advised to continue her current medications including carvedilol 3.125 mg p.o. twice with breakfast . 3) Lipids/HPL:   Review of her recent lipid panel showed LDL controlled at 51.   she  is advised to continue patient is advised to continue Lipitor 80 mg mg daily at bedtime.  Side effects and precautions discussed with her.  4)  Weight/Diet:  Body mass index is 31 kg/m.  -  clearly complicating her diabetes care. CDE Consult will be initiated . Exercise, and detailed carbohydrates information provided  -  detailed on discharge instructions.  5) Chronic Care/Health Maintenance:  -she  Is on  Statin medications and  is encouraged to initiate and continue to follow up with Ophthalmology, Dentist, pulmonology given her oxygen requiring COPD, podiatrist at least yearly or according to recommendations, and advised to quit smoking (this patient smoked 60+ pack year starting from age 25). I have recommended yearly flu vaccine and pneumonia vaccine at least every 5 years; moderate intensity exercise for up to 150 minutes weekly; and  sleep for at least 7 hours a day.  - I advised patient to maintain close follow up with Virgina Norfolk, MD for primary care needs.  - Time spent with the patient: 25 min, of which >50% was spent in reviewing her blood glucose logs , discussing her hypo- and hyper-glycemic episodes, reviewing her current and  previous labs and insulin doses and developing a plan to avoid hypo- and hyper-glycemia. Please refer to Patient Instructions for Blood Glucose Monitoring and Insulin/Medications Dosing Guide"  in media tab for additional information. [name] participated in the discussions, expressed understanding, and voiced agreement with the above plans.  All questions were answered to her satisfaction. she is encouraged to contact clinic should she have any questions or concerns prior to her return visit.   Follow up plan: - Return in about 3 months (around 11/28/2018) for Meter, and Logs.  Marquis Lunch, MD [facility] [address] [place], [place] [address] Phone: [phone]  Fax: [phone]    08/29/2018, 2:54 PM  This note was partially dictated with voice recognition software. Similar sounding words can be transcribed inadequately or may not  be corrected upon review.

## 2018-08-29 NOTE — Patient Instructions (Signed)

## 2018-09-07 ENCOUNTER — Other Ambulatory Visit: Payer: Self-pay

## 2018-09-07 MED ORDER — INSULIN LISPRO (1 UNIT DIAL) 100 UNIT/ML (KWIKPEN): 10.0000 [IU] | PEN_INJECTOR | Freq: Three times a day (TID) | SUBCUTANEOUS | 2 refills | Status: DC

## 2018-11-01 ENCOUNTER — Other Ambulatory Visit: Payer: Self-pay | Admitting: "Endocrinology

## 2018-11-01 DIAGNOSIS — E1165 Type 2 diabetes mellitus with hyperglycemia: Secondary | ICD-10-CM

## 2018-11-30 ENCOUNTER — Ambulatory Visit: Payer: Federal, State, Local not specified - PPO | Admitting: "Endocrinology

## 2018-12-03 ENCOUNTER — Other Ambulatory Visit: Payer: Self-pay | Admitting: "Endocrinology

## 2018-12-08 ENCOUNTER — Other Ambulatory Visit: Payer: Self-pay | Admitting: "Endocrinology

## 2018-12-08 DIAGNOSIS — E1165 Type 2 diabetes mellitus with hyperglycemia: Secondary | ICD-10-CM

## 2018-12-20 ENCOUNTER — Ambulatory Visit: Payer: Federal, State, Local not specified - PPO | Admitting: "Endocrinology

## 2019-01-01 ENCOUNTER — Other Ambulatory Visit: Payer: Self-pay | Admitting: "Endocrinology

## 2019-01-03 ENCOUNTER — Ambulatory Visit: Payer: Federal, State, Local not specified - PPO | Admitting: "Endocrinology

## 2019-01-12 ENCOUNTER — Other Ambulatory Visit: Payer: Self-pay | Admitting: "Endocrinology

## 2019-01-13 LAB — HGB A1C W/O EAG: Hgb A1c MFr Bld: 8.6 % — ABNORMAL HIGH (ref 4.8–5.6)

## 2019-01-17 ENCOUNTER — Ambulatory Visit (INDEPENDENT_AMBULATORY_CARE_PROVIDER_SITE_OTHER): Payer: Federal, State, Local not specified - PPO | Admitting: "Endocrinology

## 2019-01-17 ENCOUNTER — Other Ambulatory Visit: Payer: Self-pay

## 2019-01-17 ENCOUNTER — Encounter: Payer: Self-pay | Admitting: "Endocrinology

## 2019-01-17 DIAGNOSIS — E1165 Type 2 diabetes mellitus with hyperglycemia: Secondary | ICD-10-CM

## 2019-01-17 DIAGNOSIS — E782 Mixed hyperlipidemia: Secondary | ICD-10-CM | POA: Diagnosis not present

## 2019-01-17 DIAGNOSIS — F17200 Nicotine dependence, unspecified, uncomplicated: Secondary | ICD-10-CM | POA: Diagnosis not present

## 2019-01-17 MED ORDER — BASAGLAR KWIKPEN 100 UNIT/ML ~~LOC~~ SOPN: PEN_INJECTOR | SUBCUTANEOUS | 3 refills | Status: DC

## 2019-01-17 NOTE — Progress Notes (Signed)
01/17/2019, 6:08 PM                                                    Endocrinology Telehealth Visit Follow up Note -During COVID -19 Pandemic  This visit type was conducted due to national recommendations for restrictions regarding the COVID-19 Pandemic  in an effort to limit this patient's exposure and mitigate transmission of the corona virus.  Due to her co-morbid illnesses, Dana Dominguez is at  moderate to high risk for complications without adequate follow up.  This format is felt to be most appropriate for her at this time.  I connected with this patient on 01/17/2019   by telephone and verified that I am speaking with the correct person using two identifiers. [name], 1956/07/15. she has verbally consented to this visit. All issues noted in this document were discussed and addressed. The format was not optimal for physical exam.   Subjective:    Patient ID: Dana Dominguez, female    DOB: April 02, 1956.  Dana Dominguez is being seen in follow-up for management of currently uncontrolled type 2 diabetes, hyperlipidemia, hypertension. PCP:   Virgina Norfolk, MD.   Past Medical History:  Diagnosis Date  . Back pain   . Diabetes (HCC)   . Dysphagia 09/29/2016  . GERD (gastroesophageal reflux disease)   . Heart murmur   . High cholesterol   . On supplemental oxygen therapy    3L [place]  at night   Past Surgical History:  Procedure Laterality Date  . BACK SURGERY     x 2 in the past  . carotid surgery     December 2015 rt   . CHOLECYSTECTOMY     [facility]  . COLON SURGERY     for diverticulitis  . ESOPHAGEAL DILATION N/A 11/21/2014   Procedure: ESOPHAGEAL DILATION;  Surgeon: Malissa Hippo, MD;  Location: [facility];  Service: Endoscopy;  Laterality: N/A;  . ESOPHAGOGASTRODUODENOSCOPY N/A 11/21/2014   Procedure: ESOPHAGOGASTRODUODENOSCOPY (EGD);  Surgeon: Malissa Hippo, MD;  Location: [facility];  Service: Endoscopy;  Laterality: N/A;  1200 - moved to 4/7 @ 9:00  .  NASAL SINUS SURGERY    . NECK SURGERY    . stent in left arm     blockage 3 yrs ago.    Social History   Socioeconomic History  . Marital status: Married    Spouse name: Not on file  . Number of children: Not on file  . Years of education: Not on file  . Highest education level: Not on file  Occupational History  . Not on file  Social Needs  . Financial resource strain: Not on file  . Food insecurity:    Worry: Not on file    Inability: Not on file  . Transportation needs:    Medical: Not on file    Non-medical: Not on file  Tobacco Use  . Smoking status: Current Every Day Smoker    Packs/day: 2.00    Years: 50.00    Pack years: 100.00  . Smokeless tobacco: Never Used  . Tobacco comment: 1 pack day since 12 yrs  Substance and Sexual Activity  . Alcohol use: No    Alcohol/week: 0.0 standard drinks  . Drug use: No  . Sexual activity: Not on  file  Lifestyle  . Physical activity:    Days per week: Not on file    Minutes per session: Not on file  . Stress: Not on file  Relationships  . Social connections:    Talks on phone: Not on file    Gets together: Not on file    Attends religious service: Not on file    Active member of club or organization: Not on file    Attends meetings of clubs or organizations: Not on file    Relationship status: Not on file  Other Topics Concern  . Not on file  Social History Narrative  . Not on file   Outpatient Encounter Medications as of 01/17/2019  Medication Sig  . ANORO ELLIPTA 62.5-25 MCG/INH AEPB daily.  . ARIPiprazole (ABILIFY) 5 MG tablet Take 5 mg by mouth daily.  Marland Kitchen aspirin EC 81 MG tablet Take 81 mg by mouth daily.  Marland Kitchen atorvastatin (LIPITOR) 80 MG tablet Take 80 mg by mouth daily.   . busPIRone (BUSPAR) 15 MG tablet Take 15 mg by mouth 3 (three) times daily.  . carvedilol (COREG) 3.125 MG tablet Take 3.125 mg by mouth daily.  Marland Kitchen desloratadine (CLARINEX) 5 MG tablet Take 5 mg by mouth daily.  Marland Kitchen dexlansoprazole (DEXILANT) 60  MG capsule Take 60 mg by mouth daily.  Marland Kitchen dicyclomine (BENTYL) 10 MG capsule Take 10 mg by mouth 2 (two) times daily.  . DULoxetine (CYMBALTA) 60 MG capsule Take 60 mg by mouth 2 (two) times daily.  . hydrOXYzine (ATARAX/VISTARIL) 50 MG tablet Take 50 mg by mouth 2 (two) times daily.  . insulin aspart (NOVOLOG FLEXPEN) 100 UNIT/ML FlexPen Inject 10-16 Units into the skin 3 (three) times daily before meals.  . Insulin Glargine (BASAGLAR KWIKPEN) 100 UNIT/ML SOPN INJECT 80 UNITS SUBCUTANEOUSLY AT BEDTIME  . metFORMIN (GLUCOPHAGE-XR) 500 MG 24 hr tablet TAKE 2 TABLETS (  DOSE) BY MOUTH ONCE DAILY WITH BREAKFAST  . metFORMIN (GLUCOPHAGE-XR) 500 MG 24 hr tablet TAKE 2 TABLETS (  DOSE) BY MOUTH ONCE DAILY WITH BREAKFAST  . traZODone (DESYREL) 100 MG tablet Take 100 mg by mouth at bedtime.   . [DISCONTINUED] HUMALOG KWIKPEN 100 UNIT/ML KwikPen INJECT 10-16 UNITS SUBCUTANEOUSLY 3 TIMES A DAY BEFORE MEALS  . [DISCONTINUED] Insulin Glargine (BASAGLAR KWIKPEN) 100 UNIT/ML SOPN INJECT 60 UNITS SUBCUTANEOUSLY AT BEDTIME   No facility-administered encounter medications on file as of 01/17/2019.     ALLERGIES: Allergies  Allergen Reactions  . Glipizide Nausea Only, Other (See Comments) and Rash    Cold sweats   . Codeine     Dizzy, nausea, cold sweat  . Plavix [Clopidogrel Bisulfate]     SOB  . Protonix [Pantoprazole Sodium]     ? nausea  . Sporanox [Itraconazole]     Major rash    VACCINATION STATUS:  There is no immunization history on file for this patient.  Diabetes  She presents for her follow-up diabetic visit. She has type 2 diabetes mellitus. Onset time: She was diagnosed at approximate age of 3. Her disease course has been improving. There are no hypoglycemic associated symptoms. Pertinent negatives for hypoglycemia include no confusion, headaches, pallor or seizures. Associated symptoms include blurred vision, fatigue, foot paresthesias, polydipsia and polyuria. Pertinent  negatives for diabetes include no chest pain and no polyphagia. There are no hypoglycemic complications. Symptoms are improving. Diabetic complications include peripheral neuropathy. (Chronic heavy smoking.) Risk factors for coronary artery disease include diabetes mellitus, dyslipidemia, family history, hypertension, sedentary lifestyle, tobacco exposure  and post-menopausal. Current diabetic treatments: She is taking Basaglar 28 units nightly, Synjardy 12.12/998 mg p.o. daily, as well as Humulin are 2-10 units 3 times a day with meals. Her weight is increasing steadily. She is following a generally unhealthy diet. When asked about meal planning, she reported none. She has not had a previous visit with a dietitian (She declined referral.). 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  Cannot exercise optimally.) participates in exercise. Her breakfast blood glucose range is generally 180-200 mg/dl. Her lunch blood glucose range is generally 180-200 mg/dl. Her dinner blood glucose range is generally 180-200 mg/dl. Her bedtime blood glucose range is generally 180-200 mg/dl. Her overall blood glucose range is 180-200 mg/dl. An ACE inhibitor/angiotensin II receptor blocker is not being taken. Eye exam is current.  Hyperlipidemia  This is a chronic problem. The current episode started more than 1 year ago. The problem is controlled. Exacerbating diseases include diabetes. Pertinent negatives include no chest pain, myalgias or shortness of breath. Current antihyperlipidemic treatment includes statins. Risk factors for coronary artery disease include diabetes mellitus, dyslipidemia, family history, hypertension, a sedentary lifestyle and post-menopausal.  Hypertension  This is a chronic problem. The current episode started more than 1 year ago. Associated symptoms include blurred vision. Pertinent negatives include no chest pain, headaches, palpitations or shortness of breath. Risk factors for coronary  artery disease include dyslipidemia, diabetes mellitus, sedentary lifestyle, smoking/tobacco exposure and family history. Past treatments include beta blockers.     Review of Systems  Constitutional: Positive for fatigue. Negative for chills, fever and unexpected weight change.  HENT: Negative for trouble swallowing and voice change.   Eyes: Positive for blurred vision. Negative for visual disturbance.  Respiratory: Positive for cough. Negative for shortness of breath and wheezing.   Cardiovascular: Negative for chest pain, palpitations and leg swelling.  Gastrointestinal: Negative for diarrhea, nausea and vomiting.  Endocrine: Positive for polydipsia and polyuria. Negative for cold intolerance, heat intolerance and polyphagia.  Musculoskeletal: Negative for arthralgias and myalgias.  Skin: Negative for color change, pallor, rash and wound.  Neurological: Negative for seizures and headaches.  Psychiatric/Behavioral: Positive for dysphoric mood. Negative for confusion and suicidal ideas.    Objective:    There were no vitals taken for this visit.  Wt Readings from Last 3 Encounters:  08/29/18 175 lb (79.4 kg)  05/25/18 157 lb (71.2 kg)  04/26/18 151 lb (68.5 kg)      CMP     Component Value Date/Time   NA 131 (L) 08/22/2018 1147   K 4.6 08/22/2018 1147   CL 91 (L) 08/22/2018 1147   CO2 25 08/22/2018 1147   GLUCOSE 200 (H) 08/22/2018 1147   BUN 8 08/22/2018 1147   CREATININE 0.74 08/22/2018 1147   CALCIUM 9.1 08/22/2018 1147   PROT 7.5 08/22/2018 1147   ALBUMIN 3.7 08/22/2018 1147   AST 23 08/22/2018 1147   ALT 12 08/22/2018 1147   ALKPHOS 119 (H) 08/22/2018 1147   BILITOT 0.3 08/22/2018 1147   GFRNONAA 87 08/22/2018 1147   GFRAA 100 08/22/2018 1147     Diabetic Labs (most recent): Lab Results  Component Value Date   HGBA1C 8.6 (H) 01/12/2019   HGBA1C 9.5 (H) 08/22/2018   HGBA1C 11.0 (H) 05/17/2018     Lipid Panel ( most recent) Lipid Panel     Component  Value Date/Time   CHOL 125 01/30/2018   TRIG 122 01/30/2018   HDL 50 01/30/2018   LDLCALC 51 01/30/2018  Lab Results  Component Value Date   TSH 1.30 01/30/2018     Assessment & Plan:   1. Uncontrolled type 2 diabetes mellitus with hyperglycemia (HCC)  - Ajiah Million has currently uncontrolled symptomatic type 2 DM since 63 years of age. -She presents with improving glycemic profile, still significantly above target.  Her previsit labs show A1c of 8.6% improving from 11.8%.    -Her recent labs are reviewed with her.  -her diabetes is complicated by peripheral neuropathy and she remains at extremely high risk for more acute and chronic complications which include CAD, CVA, CKD, retinopathy, and neuropathy. These are all discussed in detail with her.  - I have counseled her on diet management and weight loss, by adopting a carbohydrate restricted/protein rich diet.  - Patient admits there is a room for improvement in her diet and drink choices. -  Suggestion is made for her to avoid simple carbohydrates  from her diet including Cakes, Sweet Desserts / Pastries, Ice Cream, Soda (diet and regular), Sweet Tea, Candies, Chips, Cookies, Store Bought Juices, Alcohol in Excess of  1-2 drinks a day, Artificial Sweeteners, and "Sugar-free" Products. This will help patient to have stable blood glucose profile and potentially avoid unintended weight gain.   - I encouraged her to switch to  unprocessed or minimally processed complex starch and increased protein intake (animal or plant source), fruits, and vegetables.  - she is advised to stick to a routine mealtimes to eat 3 meals  a day and avoid unnecessary snacks ( to snack only to correct hypoglycemia).   - she will be scheduled with Norm Salt, RDN, CDE for individualized diabetes education.  - I have approached her with the following individualized plan to manage diabetes and patient agrees:   -Given her current and prevailing  glycemic burden, she will continue to require intensive treatment with higher dose of basal/bolus insulin in order for her to achieve and maintain control of diabetes to target.   -She is advised to increase Basaglar to 80 units nightly, change her regular insulin to NovoLog 10 units  3 times a day with meals  for pre-meal BG readings of 90-150mg /dl, plus patient specific correction dose for unexpected hyperglycemia above 150mg /dl, associated with strict monitoring of glucose 4 times a day-before meals and at bedtime. - Patient is warned not to take insulin without proper monitoring per orders. -Adjustment parameters are given for hypo and hyperglycemia in writing. - she is encouraged to call clinic for blood glucose levels less than 70 or above 300 mg /dl. -She is advised to continue metformin 1000 mg ER once a day after breakfast,  therapeutically suitable for patient .  - she is not a candidate for incretin therapy, SGLT2 inhibitors .  Is chronic heavy smoker.  - Patient specific target  A1c;  LDL, HDL, Triglycerides, and  Waist Circumference were discussed in detail.  2) BP/HTN:  she is advised to home monitor blood pressure and report if > 140/90 on 2 separate readings.   she is advised to continue her current medications including carvedilol 3.125 mg p.o. twice with breakfast .   3) Lipids/HPL:   Review of her recent lipid panel showed LDL controlled at 51.   she  is advised to continue patient is advised to continue Lipitor 80 mg mg daily at bedtime.  Side effects and precautions discussed with her.  4)  Weight/Diet:  There is no height or weight on file to calculate BMI.  - clearly complicating  her diabetes care. CDE Consult will be initiated . Exercise, and detailed carbohydrates information provided  -  detailed on discharge instructions.  5) Chronic Care/Health Maintenance:  -she  Is on  Statin medications and  is encouraged to initiate and continue to follow up with Ophthalmology,  Dentist, pulmonology given her oxygen requiring COPD, podiatrist at least yearly or according to recommendations, and advised to quit smoking (this patient smoked 60+ pack year starting from age 89). I have recommended yearly flu vaccine and pneumonia vaccine at least every 5 years; moderate intensity exercise for up to 150 minutes weekly; and  sleep for at least 7 hours a day.  - I advised patient to maintain close follow up with Virgina Norfolk, MD for primary care needs.  - Patient Care Time Today:  25 min, of which >50% was spent in reviewing her  current and  previous labs/studies, previous treatments, and medications doses and developing a plan for long-term care based on the latest recommendations for standards of care.  [name] participated in the discussions, expressed understanding, and voiced agreement with the above plans.  All questions were answered to her satisfaction. she is encouraged to contact clinic should she have any questions or concerns prior to her return visit.   Follow up plan: - Return in about 3 months (around 04/19/2019) for Follow up with Pre-visit Labs, Meter, and Logs.  Marquis Lunch, MD [facility] [address] [place], [place] [address] Phone: [phone]  Fax: [phone]    01/17/2019, 6:08 PM  This note was partially dictated with voice recognition software. Similar sounding words can be transcribed inadequately or may not  be corrected upon review.

## 2019-01-18 ENCOUNTER — Ambulatory Visit: Payer: Federal, State, Local not specified - PPO | Admitting: "Endocrinology

## 2019-01-23 ENCOUNTER — Telehealth: Payer: Self-pay | Admitting: "Endocrinology

## 2019-01-23 NOTE — Telephone Encounter (Signed)
Pt said she received her AVS in the mail from visit 6/3 and it says to stop taking her Humalog. She would like clarification on this.

## 2019-01-24 NOTE — Telephone Encounter (Signed)
Went over med list with pt.

## 2019-02-28 ENCOUNTER — Other Ambulatory Visit: Payer: Self-pay | Admitting: "Endocrinology

## 2019-03-24 ENCOUNTER — Other Ambulatory Visit: Payer: Self-pay | Admitting: "Endocrinology

## 2019-04-11 LAB — BASIC METABOLIC PANEL
BUN: 6 (ref 4–21)
Creatinine: 0.7 (ref <=1.1)
Sodium: 131 — AB (ref 137–147)

## 2019-04-11 LAB — TSH: TSH: 2.65 (ref <=5.90)

## 2019-04-13 ENCOUNTER — Other Ambulatory Visit: Payer: Self-pay | Admitting: "Endocrinology

## 2019-04-19 ENCOUNTER — Encounter: Payer: Self-pay | Admitting: "Endocrinology

## 2019-04-19 ENCOUNTER — Ambulatory Visit (INDEPENDENT_AMBULATORY_CARE_PROVIDER_SITE_OTHER): Payer: Federal, State, Local not specified - PPO | Admitting: "Endocrinology

## 2019-04-19 ENCOUNTER — Other Ambulatory Visit: Payer: Self-pay

## 2019-04-19 ENCOUNTER — Other Ambulatory Visit: Payer: Self-pay | Admitting: Nurse Practitioner

## 2019-04-19 DIAGNOSIS — F17200 Nicotine dependence, unspecified, uncomplicated: Secondary | ICD-10-CM | POA: Diagnosis not present

## 2019-04-19 DIAGNOSIS — E1165 Type 2 diabetes mellitus with hyperglycemia: Secondary | ICD-10-CM | POA: Diagnosis not present

## 2019-04-19 DIAGNOSIS — E782 Mixed hyperlipidemia: Secondary | ICD-10-CM | POA: Diagnosis not present

## 2019-04-19 NOTE — Progress Notes (Signed)
04/19/2019, 1:45 PM                                                    Endocrinology Telehealth Visit Follow up Note -During COVID -19 Pandemic  This visit type was conducted due to national recommendations for restrictions regarding the COVID-19 Pandemic  in an effort to limit this patient's exposure and mitigate transmission of the corona virus.  Due to her co-morbid illnesses, Dana Dominguez is at  moderate to high risk for complications without adequate follow up.  This format is felt to be most appropriate for her at this time.  I connected with this patient on 04/19/2019   by telephone and verified that I am speaking with the correct person using two identifiers. Dana Dominguez, 03-Mar-1956. she has verbally consented to this visit. All issues noted in this document were discussed and addressed. The format was not optimal for physical exam.   Subjective:    Patient ID: Dana Dominguez, female    DOB: 03-Mar-1956.  Dana Dominguez is being engaged in telehealth via telephone in follow-up for management of currently uncontrolled type 2 diabetes, hyperlipidemia, hypertension. PCP:   Virgina NorfolkBarker, Wendy L, MD.   Past Medical History:  Diagnosis Date  . Back pain   . Diabetes (HCC)   . Dysphagia 09/29/2016  . GERD (gastroesophageal reflux disease)   . Heart murmur   . High cholesterol   . On supplemental oxygen therapy    3L [place]  at night   Past Surgical History:  Procedure Laterality Date  . BACK SURGERY     x 2 in the past  . carotid surgery     December 2015 rt   . CHOLECYSTECTOMY     [facility]  . COLON SURGERY     for diverticulitis  . ESOPHAGEAL DILATION N/A 11/21/2014   Procedure: ESOPHAGEAL DILATION;  Surgeon: Malissa HippoNajeeb U Rehman, MD;  Location: [facility];  Service: Endoscopy;  Laterality: N/A;  . ESOPHAGOGASTRODUODENOSCOPY N/A 11/21/2014   Procedure: ESOPHAGOGASTRODUODENOSCOPY (EGD);  Surgeon: Malissa HippoNajeeb U Rehman, MD;  Location: [facility];  Service: Endoscopy;  Laterality: N/A;   1200 - moved to 4/7 @ 9:00  . NASAL SINUS SURGERY    . NECK SURGERY    . stent in left arm     blockage 3 yrs ago.    Social History   Socioeconomic History  . Marital status: Married    Spouse name: Not on file  . Number of children: Not on file  . Years of education: Not on file  . Highest education level: Not on file  Occupational History  . Not on file  Social Needs  . Financial resource strain: Not on file  . Food insecurity    Worry: Not on file    Inability: Not on file  . Transportation needs    Medical: Not on file    Non-medical: Not on file  Tobacco Use  . Smoking status: Current Every Day Smoker    Packs/day: 2.00    Years: 50.00    Pack years: 100.00  . Smokeless tobacco: Never Used  . Tobacco comment: 1 pack day since 12 yrs  Substance and Sexual Activity  . Alcohol use: No    Alcohol/week: 0.0 standard drinks  . Drug use: No  .  Sexual activity: Not on file  Lifestyle  . Physical activity    Days per week: Not on file    Minutes per session: Not on file  . Stress: Not on file  Relationships  . Social Musician on phone: Not on file    Gets together: Not on file    Attends religious service: Not on file    Active member of club or organization: Not on file    Attends meetings of clubs or organizations: Not on file    Relationship status: Not on file  Other Topics Concern  . Not on file  Social History Narrative  . Not on file   Outpatient Encounter Medications as of 04/19/2019  Medication Sig  . ANORO ELLIPTA 62.5-25 MCG/INH AEPB daily.  . ARIPiprazole (ABILIFY) 5 MG tablet Take 5 mg by mouth daily.  Marland Kitchen aspirin EC 81 MG tablet Take 81 mg by mouth daily.  Marland Kitchen atorvastatin (LIPITOR) 80 MG tablet Take 80 mg by mouth daily.   . busPIRone (BUSPAR) 15 MG tablet Take 15 mg by mouth 3 (three) times daily.  . carvedilol (COREG) 3.125 MG tablet Take 3.125 mg by mouth daily.  Marland Kitchen desloratadine (CLARINEX) 5 MG tablet Take 5 mg by mouth daily.  Marland Kitchen  dexlansoprazole (DEXILANT) 60 MG capsule Take 60 mg by mouth daily.  Marland Kitchen dicyclomine (BENTYL) 10 MG capsule Take 10 mg by mouth 2 (two) times daily.  . DULoxetine (CYMBALTA) 60 MG capsule Take 60 mg by mouth 2 (two) times daily.  Marland Kitchen HUMALOG KWIKPEN 100 UNIT/ML KwikPen INJECT 10-16 UNITS SUBCUTANEOUSLY 3 TIMES A DAY BEFORE MEALS  . hydrOXYzine (ATARAX/VISTARIL) 50 MG tablet Take 50 mg by mouth 2 (two) times daily.  . insulin aspart (NOVOLOG FLEXPEN) 100 UNIT/ML FlexPen Inject 10-16 Units into the skin 3 (three) times daily before meals.  . Insulin Glargine (BASAGLAR KWIKPEN) 100 UNIT/ML SOPN INJECT 80 UNITS SUBCUTANEOUSLY AT BEDTIME  . metFORMIN (GLUCOPHAGE-XR) 500 MG 24 hr tablet TAKE 2 TABLETS (1000MG  DOSE) BY MOUTH ONCE DAILY WITH BREAKFAST  . metFORMIN (GLUCOPHAGE-XR) 500 MG 24 hr tablet TAKE 2 TABLETS (1000MG  DOSE) BY MOUTH ONCE DAILY WITH BREAKFAST  . traZODone (DESYREL) 100 MG tablet Take 100 mg by mouth at bedtime.    No facility-administered encounter medications on file as of 04/19/2019.     ALLERGIES: Allergies  Allergen Reactions  . Glipizide Nausea Only, Other (See Comments) and Rash    Cold sweats   . Codeine     Dizzy, nausea, cold sweat  . Plavix [Clopidogrel Bisulfate]     SOB  . Protonix [Pantoprazole Sodium]     ? nausea  . Sporanox [Itraconazole]     Major rash    VACCINATION STATUS:  There is no immunization history on file for this patient.  Diabetes She presents for her follow-up diabetic visit. She has type 2 diabetes mellitus. Onset time: She was diagnosed at approximate age of 22. Her disease course has been improving. There are no hypoglycemic associated symptoms. Pertinent negatives for hypoglycemia include no confusion, headaches, pallor or seizures. Associated symptoms include fatigue and foot paresthesias. Pertinent negatives for diabetes include no blurred vision, no chest pain, no polydipsia, no polyphagia and no polyuria. There are no hypoglycemic  complications. Symptoms are improving. Diabetic complications include peripheral neuropathy. (Chronic heavy smoking.) Risk factors for coronary artery disease include diabetes mellitus, dyslipidemia, family history, hypertension, sedentary lifestyle, tobacco exposure and post-menopausal. Current diabetic treatments: She is taking Basaglar 28 units  nightly, Synjardy 12.12/998 mg p.o. daily, as well as Humulin are 2-10 units 3 times a day with meals. She is following a generally unhealthy diet. When asked about meal planning, she reported none. She has not had a previous visit with a dietitian (She declined referral.). 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  Cannot exercise optimally.) participates in exercise. Her breakfast blood glucose range is generally 140-180 mg/dl. Her lunch blood glucose range is generally 140-180 mg/dl. Her dinner blood glucose range is generally 140-180 mg/dl. Her bedtime blood glucose range is generally 140-180 mg/dl. Her overall blood glucose range is 140-180 mg/dl. An ACE inhibitor/angiotensin II receptor blocker is not being taken. Eye exam is current.  Hyperlipidemia This is a chronic problem. The current episode started more than 1 year ago. The problem is controlled. Exacerbating diseases include diabetes. Pertinent negatives include no chest pain, myalgias or shortness of breath. Current antihyperlipidemic treatment includes statins. Risk factors for coronary artery disease include diabetes mellitus, dyslipidemia, family history, hypertension, a sedentary lifestyle and post-menopausal.  Hypertension This is a chronic problem. The current episode started more than 1 year ago. Pertinent negatives include no blurred vision, chest pain, headaches, palpitations or shortness of breath. Risk factors for coronary artery disease include dyslipidemia, diabetes mellitus, sedentary lifestyle, smoking/tobacco exposure and family history. Past treatments include beta  blockers.     Review of Systems  Constitutional: Positive for fatigue. Negative for chills, fever and unexpected weight change.  HENT: Negative for trouble swallowing and voice change.   Eyes: Negative for blurred vision and visual disturbance.  Respiratory: Positive for cough. Negative for shortness of breath and wheezing.   Cardiovascular: Negative for chest pain, palpitations and leg swelling.  Gastrointestinal: Negative for diarrhea, nausea and vomiting.  Endocrine: Negative for cold intolerance, heat intolerance, polydipsia, polyphagia and polyuria.  Musculoskeletal: Negative for arthralgias and myalgias.  Skin: Negative for color change, pallor, rash and wound.  Neurological: Negative for seizures and headaches.  Psychiatric/Behavioral: Positive for dysphoric mood. Negative for confusion and suicidal ideas.    Objective:    There were no vitals taken for this visit.  Wt Readings from Last 3 Encounters:  08/29/18 175 lb (79.4 kg)  05/25/18 157 lb (71.2 kg)  04/26/18 151 lb (68.5 kg)      CMP     Component Value Date/Time   NA 131 (A) 04/11/2019   K 4.6 08/22/2018 1147   CL 91 (L) 08/22/2018 1147   CO2 25 08/22/2018 1147   GLUCOSE 200 (H) 08/22/2018 1147   BUN 6 04/11/2019   CREATININE 0.7 04/11/2019   CREATININE 0.74 08/22/2018 1147   CALCIUM 9.1 08/22/2018 1147   PROT 7.5 08/22/2018 1147   ALBUMIN 3.7 08/22/2018 1147   AST 23 08/22/2018 1147   ALT 12 08/22/2018 1147   ALKPHOS 119 (H) 08/22/2018 1147   BILITOT 0.3 08/22/2018 1147   GFRNONAA 87 08/22/2018 1147   GFRAA 100 08/22/2018 1147     Diabetic Labs (most recent): Lab Results  Component Value Date   HGBA1C 8.6 (H) 01/12/2019   HGBA1C 9.5 (H) 08/22/2018   HGBA1C 11.0 (H) 05/17/2018     Lipid Panel ( most recent) Lipid Panel     Component Value Date/Time   CHOL 125 01/30/2018   TRIG 122 01/30/2018   HDL 50 01/30/2018   LDLCALC 51 01/30/2018      Lab Results  Component Value Date   TSH  2.65 04/11/2019   TSH 1.30 01/30/2018  Assessment & Plan:   1. Uncontrolled type 2 diabetes mellitus with hyperglycemia (HCC)  - Dana Dominguez has currently uncontrolled symptomatic type 2 DM since 63 years of age. -She reports improving glycemic profile: Fasting between 162-177, prelunch between 98- 203, treated with supper between 136-168.  Her previsit labs did not include A1c due to the fact that less than 90 days since last A1c of 8.6% which was improving from 11.8%.    -Her recent labs are reviewed with her.  -her diabetes is complicated by peripheral neuropathy and she remains at extremely high risk for more acute and chronic complications which include CAD, CVA, CKD, retinopathy, and neuropathy. These are all discussed in detail with her.  - I have counseled her on diet management and weight loss, by adopting a carbohydrate restricted/protein rich diet.  - she  admits there is a room for improvement in her diet and drink choices. -  Suggestion is made for her to avoid simple carbohydrates  from her diet including Cakes, Sweet Desserts / Pastries, Ice Cream, Soda (diet and regular), Sweet Tea, Candies, Chips, Cookies, Sweet Pastries,  Store Bought Juices, Alcohol in Excess of  1-2 drinks a day, Artificial Sweeteners, Coffee Creamer, and "Sugar-free" Products. This will help patient to have stable blood glucose profile and potentially avoid unintended weight gain.  - I encouraged her to switch to  unprocessed or minimally processed complex starch and increased protein intake (animal or plant source), fruits, and vegetables.  - she is advised to stick to a routine mealtimes to eat 3 meals  a day and avoid unnecessary snacks ( to snack only to correct hypoglycemia).   - she will be scheduled with Norm Salt, RDN, CDE for individualized diabetes education.  - I have approached her with the following individualized plan to manage diabetes and patient agrees:   -Given her current and  prevailing glycemic burden, she will continue to require intensive treatment with higher dose of basal/bolus insulin in order for her to achieve and maintain control of diabetes to target.   -She is advised to continue Basaglar 80 units nightly, Humalog 10 units  3 times a day with meals  for pre-meal BG readings of 90-150mg /dl, plus patient specific correction dose for unexpected hyperglycemia above 150mg /dl, associated with strict monitoring of glucose 4 times a day-before meals and at bedtime. - Patient is warned not to take insulin without proper monitoring per orders. -Adjustment parameters are given for hypo and hyperglycemia in writing. - she is encouraged to call clinic for blood glucose levels less than 70 or above 300 mg /dl. -She is advised to continue metformin 1000 mg ER once a day after breakfast,  therapeutically suitable for patient .  - she is not a candidate for incretin therapy, SGLT2 inhibitors .  Is chronic heavy smoker.  - Patient specific target  A1c;  LDL, HDL, Triglycerides, and  Waist Circumference were discussed in detail.  2) BP/HTN:  she is advised to home monitor blood pressure and report if > 140/90 on 2 separate readings.   she is advised to continue her current medications including carvedilol 3.125 mg p.o. twice with breakfast .   3) Lipids/HPL:   Review of her recent lipid panel showed LDL controlled at 51.   she  is advised to continue patient is advised to continue Lipitor 80 mg p.o. daily at bedtime.  Side effects and precautions discussed with her.  4)  Weight/Diet:   - clearly complicating her diabetes care. CDE  Consult will be initiated . Exercise, and detailed carbohydrates information provided  -  detailed on discharge instructions.  5) Chronic Care/Health Maintenance:  -she  Is on  Statin medications and  is encouraged to initiate and continue to follow up with Ophthalmology, Dentist, pulmonology given her oxygen requiring COPD, podiatrist at least  yearly or according to recommendations, and advised to quit smoking (this patient smoked 60+ pack year starting from age 63). I have recommended yearly flu vaccine and pneumonia vaccine at least every 5 years; moderate intensity exercise for up to 150 minutes weekly; and  sleep for at least 7 hours a day.  - I advised patient to maintain close follow up with Virgina NorfolkBarker, Wendy L, MD for primary care needs.  - Patient Care Time Today:  25 min, of which >50% was spent in  counseling and the rest reviewing her  current and  previous labs/studies, previous treatments, her blood glucose readings, and medications' doses and developing a plan for long-term care based on the latest recommendations for standards of care.   Dana Dominguez participated in the discussions, expressed understanding, and voiced agreement with the above plans.  All questions were answered to her satisfaction. she is encouraged to contact clinic should she have any questions or concerns prior to her return visit.  Follow up plan: - Return in about 8 weeks (around 06/14/2019) for Bring Meter and Logs- A1c in Office, Include 6 log sheets.  Marquis LunchGebre Amonda Brillhart, MD [facility] [address] [place], [place] [address] Phone: [phone]  Fax: [phone]    04/19/2019, 1:45 PM  This note was partially dictated with voice recognition software. Similar sounding words can be transcribed inadequately or may not  be corrected upon review.

## 2019-04-20 ENCOUNTER — Ambulatory Visit: Payer: Federal, State, Local not specified - PPO | Admitting: "Endocrinology

## 2019-06-01 ENCOUNTER — Other Ambulatory Visit: Payer: Self-pay | Admitting: "Endocrinology

## 2019-06-19 ENCOUNTER — Ambulatory Visit: Payer: Federal, State, Local not specified - PPO | Admitting: "Endocrinology

## 2019-07-02 ENCOUNTER — Other Ambulatory Visit: Payer: Self-pay

## 2019-07-02 ENCOUNTER — Encounter: Payer: Self-pay | Admitting: "Endocrinology

## 2019-07-02 ENCOUNTER — Ambulatory Visit (INDEPENDENT_AMBULATORY_CARE_PROVIDER_SITE_OTHER): Payer: Federal, State, Local not specified - PPO | Admitting: "Endocrinology

## 2019-07-02 VITALS — BP 150/77 | HR 90 | Ht 63.0 in | Wt 167.0 lb

## 2019-07-02 DIAGNOSIS — E1165 Type 2 diabetes mellitus with hyperglycemia: Secondary | ICD-10-CM

## 2019-07-02 LAB — POCT GLYCOSYLATED HEMOGLOBIN (HGB A1C): Hemoglobin A1C: 8.8 % — AB (ref 4.0–5.6)

## 2019-07-02 MED ORDER — GLIPIZIDE ER 2.5 MG PO TB24: 2.5000 mg | ORAL_TABLET | Freq: Every day | ORAL | 2 refills | Status: DC

## 2019-07-02 NOTE — Progress Notes (Signed)
07/02/2019, 2:02 PM   Endocrinology follow-up note   Subjective:    Patient ID: Dana GauzeJanet Sciandra, female    DOB: 1955/12/24.  Dana GauzeJanet Crespin is being seen in follow-up for management of currently uncontrolled type 2 diabetes, hyperlipidemia, hypertension. PCP:   Virgina NorfolkBarker, Wendy L, MD.   Past Medical History:  Diagnosis Date  . Back pain   . Diabetes (HCC)   . Dysphagia 09/29/2016  . GERD (gastroesophageal reflux disease)   . Heart murmur   . High cholesterol   . On supplemental oxygen therapy    3L [place]  at night   Past Surgical History:  Procedure Laterality Date  . BACK SURGERY     x 2 in the past  . carotid surgery     December 2015 rt   . CHOLECYSTECTOMY     [facility]  . COLON SURGERY     for diverticulitis  . ESOPHAGEAL DILATION N/A 11/21/2014   Procedure: ESOPHAGEAL DILATION;  Surgeon: Malissa HippoNajeeb U Rehman, MD;  Location: [facility];  Service: Endoscopy;  Laterality: N/A;  . ESOPHAGOGASTRODUODENOSCOPY N/A 11/21/2014   Procedure: ESOPHAGOGASTRODUODENOSCOPY (EGD);  Surgeon: Malissa HippoNajeeb U Rehman, MD;  Location: [facility];  Service: Endoscopy;  Laterality: N/A;  1200 - moved to 4/7 @ 9:00  . NASAL SINUS SURGERY    . NECK SURGERY    . stent in left arm     blockage 3 yrs ago.    Social History   Socioeconomic History  . Marital status: Married    Spouse name: Not on file  . Number of children: Not on file  . Years of education: Not on file  . Highest education level: Not on file  Occupational History  . Not on file  Social Needs  . Financial resource strain: Not on file  . Food insecurity    Worry: Not on file    Inability: Not on file  . Transportation needs    Medical: Not on file    Non-medical: Not on file  Tobacco Use  . Smoking status: Current Every Day Smoker    Packs/day: 2.00    Years: 50.00    Pack years: 100.00  . Smokeless tobacco: Never Used  . Tobacco comment: 1 pack day since 12 yrs  Substance and Sexual Activity  . Alcohol  use: No    Alcohol/week: 0.0 standard drinks  . Drug use: No  . Sexual activity: Not on file  Lifestyle  . Physical activity    Days per week: Not on file    Minutes per session: Not on file  . Stress: Not on file  Relationships  . Social Musicianconnections    Talks on phone: Not on file    Gets together: Not on file    Attends religious service: Not on file    Active member of club or organization: Not on file    Attends meetings of clubs or organizations: Not on file    Relationship status: Not on file  Other Topics Concern  . Not on file  Social History Narrative  . Not on file   Outpatient Encounter Medications as of 07/02/2019  Medication Sig  . ANORO ELLIPTA 62.5-25 MCG/INH AEPB daily.  . ARIPiprazole (ABILIFY) 5 MG tablet Take 5 mg by mouth daily.  Marland Kitchen. aspirin EC 81 MG tablet Take 81 mg by mouth daily.  Marland Kitchen. atorvastatin (LIPITOR) 80 MG tablet Take 80 mg by mouth daily.   . busPIRone (BUSPAR)  15 MG tablet Take 15 mg by mouth 3 (three) times daily.  . carvedilol (COREG) 3.125 MG tablet Take 3.125 mg by mouth daily.  Marland Kitchen desloratadine (CLARINEX) 5 MG tablet Take 5 mg by mouth daily.  Marland Kitchen dexlansoprazole (DEXILANT) 60 MG capsule Take 60 mg by mouth daily.  Marland Kitchen dicyclomine (BENTYL) 10 MG capsule Take 10 mg by mouth 2 (two) times daily.  . DULoxetine (CYMBALTA) 60 MG capsule Take 60 mg by mouth 2 (two) times daily.  Marland Kitchen glipiZIDE (GLUCOTROL XL) 2.5 MG 24 hr tablet Take 1 tablet (2.5 mg total) by mouth daily with breakfast.  . HUMALOG KWIKPEN 100 UNIT/ML KwikPen INJECT 10-16 UNITS SUBCUTANEOUSLY 3 TIMES A DAY  . hydrOXYzine (ATARAX/VISTARIL) 50 MG tablet Take 50 mg by mouth 2 (two) times daily.  . Insulin Glargine (BASAGLAR KWIKPEN) 100 UNIT/ML SOPN INJECT 80 UNITS SUBCUTANEOUSLY AT BEDTIME  . metFORMIN (GLUCOPHAGE-XR) 500 MG 24 hr tablet TAKE 2 TABLETS (1000MG  DOSE) BY MOUTH ONCE DAILY WITH BREAKFAST  . traZODone (DESYREL) 100 MG tablet Take 100 mg by mouth at bedtime.   . [DISCONTINUED]  insulin aspart (NOVOLOG FLEXPEN) 100 UNIT/ML FlexPen Inject 10-16 Units into the skin 3 (three) times daily before meals.  . [DISCONTINUED] metFORMIN (GLUCOPHAGE-XR) 500 MG 24 hr tablet TAKE 2 TABLETS (1000MG  DOSE) BY MOUTH ONCE DAILY WITH BREAKFAST   No facility-administered encounter medications on file as of 07/02/2019.     ALLERGIES: Allergies  Allergen Reactions  . Glipizide Nausea Only, Other (See Comments) and Rash    Cold sweats   . Codeine     Dizzy, nausea, cold sweat  . Plavix [Clopidogrel Bisulfate]     SOB  . Protonix [Pantoprazole Sodium]     ? nausea  . Sporanox [Itraconazole]     Major rash    VACCINATION STATUS:  There is no immunization history on file for this patient.  Diabetes She presents for her follow-up diabetic visit. She has type 2 diabetes mellitus. Onset time: She was diagnosed at approximate age of 63. Her disease course has been improving. There are no hypoglycemic associated symptoms. Pertinent negatives for hypoglycemia include no confusion, headaches, pallor or seizures. Associated symptoms include fatigue and foot paresthesias. Pertinent negatives for diabetes include no blurred vision, no chest pain, no polydipsia, no polyphagia and no polyuria. There are no hypoglycemic complications. Symptoms are improving. Diabetic complications include peripheral neuropathy. (Chronic heavy smoking.) Risk factors for coronary artery disease include diabetes mellitus, dyslipidemia, family history, hypertension, sedentary lifestyle, tobacco exposure and post-menopausal. Current diabetic treatments: She is taking Basaglar 28 units nightly, Synjardy 12.12/998 mg p.o. daily, as well as Humulin are 2-10 units 3 times a day with meals. She is following a generally unhealthy diet. When asked about meal planning, she reported none. She has not had a previous visit with a dietitian (She declined referral.). 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  Cannot  exercise optimally.) participates in exercise. Her breakfast blood glucose range is generally 140-180 mg/dl. Her lunch blood glucose range is generally 140-180 mg/dl. Her dinner blood glucose range is generally 140-180 mg/dl. Her bedtime blood glucose range is generally 140-180 mg/dl. Her overall blood glucose range is 140-180 mg/dl. An ACE inhibitor/angiotensin II receptor blocker is not being taken. Eye exam is current.  Hyperlipidemia This is a chronic problem. The current episode started more than 1 year ago. The problem is controlled. Exacerbating diseases include diabetes. Pertinent negatives include no chest pain, myalgias or shortness of breath. Current antihyperlipidemic treatment  includes statins. Risk factors for coronary artery disease include diabetes mellitus, dyslipidemia, family history, hypertension, a sedentary lifestyle and post-menopausal.  Hypertension This is a chronic problem. The current episode started more than 1 year ago. Pertinent negatives include no blurred vision, chest pain, headaches, palpitations or shortness of breath. Risk factors for coronary artery disease include dyslipidemia, diabetes mellitus, sedentary lifestyle, smoking/tobacco exposure and family history. Past treatments include beta blockers.     Review of Systems  Constitutional: Positive for fatigue. Negative for chills, fever and unexpected weight change.  HENT: Negative for trouble swallowing and voice change.   Eyes: Negative for blurred vision and visual disturbance.  Respiratory: Positive for cough. Negative for shortness of breath and wheezing.   Cardiovascular: Negative for chest pain, palpitations and leg swelling.  Gastrointestinal: Negative for diarrhea, nausea and vomiting.  Endocrine: Negative for cold intolerance, heat intolerance, polydipsia, polyphagia and polyuria.  Musculoskeletal: Negative for arthralgias and myalgias.  Skin: Negative for color change, pallor, rash and wound.   Neurological: Negative for seizures and headaches.  Psychiatric/Behavioral: Positive for dysphoric mood. Negative for confusion and suicidal ideas.    Objective:    BP (!) 150/77   Pulse 90   Ht  (1.6 m)   Wt 167 lb (75.8 kg)   BMI 29.58 kg/m   Wt Readings from Last 3 Encounters:  07/02/19 167 lb (75.8 kg)  08/29/18 175 lb (79.4 kg)  05/25/18 157 lb (71.2 kg)      CMP     Component Value Date/Time   NA 131 (A) 04/11/2019   K 4.6 08/22/2018 1147   CL 91 (L) 08/22/2018 1147   CO2 25 08/22/2018 1147   GLUCOSE 200 (H) 08/22/2018 1147   BUN 6 04/11/2019   CREATININE 0.7 04/11/2019   CREATININE 0.74 08/22/2018 1147   CALCIUM 9.1 08/22/2018 1147   PROT 7.5 08/22/2018 1147   ALBUMIN 3.7 08/22/2018 1147   AST 23 08/22/2018 1147   ALT 12 08/22/2018 1147   ALKPHOS 119 (H) 08/22/2018 1147   BILITOT 0.3 08/22/2018 1147   GFRNONAA 87 08/22/2018 1147   GFRAA 100 08/22/2018 1147     Diabetic Labs (most recent): Lab Results  Component Value Date   HGBA1C 8.8 (A) 07/02/2019   HGBA1C 8.6 (H) 01/12/2019   HGBA1C 9.5 (H) 08/22/2018     Lipid Panel ( most recent) Lipid Panel     Component Value Date/Time   CHOL 125 01/30/2018   TRIG 122 01/30/2018   HDL 50 01/30/2018   LDLCALC 51 01/30/2018      Lab Results  Component Value Date   TSH 2.65 04/11/2019   TSH 1.30 01/30/2018     Assessment & Plan:   1. Uncontrolled type 2 diabetes mellitus with hyperglycemia (HCC)  - Clemie General has currently uncontrolled symptomatic type 2 DM since 63 years of age. -She reports improving glycemic profile: Fasting between 142-170,  prelunch between 58- 203,  supper between 130-177.  Her previsit labs show A1c of 8.8%, unchanged from her last visit A1c although improving from 11.8% overall.    -Her recent labs are reviewed with her.  -her diabetes is complicated by peripheral neuropathy and she remains at extremely high risk for more acute and chronic complications which  include CAD, CVA, CKD, retinopathy, and neuropathy. These are all discussed in detail with her.  - I have counseled her on diet management and weight loss, by adopting a carbohydrate restricted/protein rich diet.  - she  admits there  is a room for improvement in her diet and drink choices. -  Suggestion is made for her to avoid simple carbohydrates  from her diet including Cakes, Sweet Desserts / Pastries, Ice Cream, Soda (diet and regular), Sweet Tea, Candies, Chips, Cookies, Sweet Pastries,  Store Bought Juices, Alcohol in Excess of  1-2 drinks a day, Artificial Sweeteners, Coffee Creamer, and "Sugar-free" Products. This will help patient to have stable blood glucose profile and potentially avoid unintended weight gain.   - I encouraged her to switch to  unprocessed or minimally processed complex starch and increased protein intake (animal or plant source), fruits, and vegetables.  - she is advised to stick to a routine mealtimes to eat 3 meals  a day and avoid unnecessary snacks ( to snack only to correct hypoglycemia).   - she will be scheduled with Jearld Fenton, RDN, CDE for individualized diabetes education.  - I have approached her with the following individualized plan to manage diabetes and patient agrees:   -Given her current and prevailing glycemic burden, she will continue to require intensive treatment with higher dose of basal/bolus insulin in order for her to achieve and maintain control of diabetes to target.   -She is advised to continue Basaglar 80 units nightly,  Humalog 10 units  3 times a day with meals  for pre-meal BG readings of 90-150mg /dl, plus patient specific correction dose for unexpected hyperglycemia above 150mg /dl, associated with strict monitoring of glucose 4 times a day-before meals and at bedtime. - Patient is warned not to take insulin without proper monitoring per orders. -Adjustment parameters are given for hypo and hyperglycemia in writing. - she is  encouraged to call clinic for blood glucose levels less than 70 or above 300 mg /dl. -She is advised to continue metformin 1000 mg ER once a day after breakfast,  therapeutically suitable for patient . -He would benefit from low-dose glipizide therapy.  I discussed and initiated glipizide 2.5 mg p.o. daily at breakfast.  - she is not a candidate for incretin therapy, SGLT2 inhibitors . She  is chronic heavy smoker.  - Patient specific target  A1c;  LDL, HDL, Triglycerides, and  Waist Circumference were discussed in detail.  2) BP/HTN: Her blood pressure is not controlled to target.   she is advised to continue her current medications including carvedilol 3.125 mg p.o. twice with breakfast .   3) Lipids/HPL:   Review of her recent lipid panel showed LDL controlled at 51.   she  is advised to continue patient is advised to continue Lipitor 80 mg p.o. daily at bedtime.   Side effects and precautions discussed with her.  4)  Weight/Diet:   - clearly complicating her diabetes care. CDE Consult will be initiated . Exercise, and detailed carbohydrates information provided  -  detailed on discharge instructions.  5) Chronic Care/Health Maintenance:  -she  Is on  Statin medications and  is encouraged to initiate and continue to follow up with Ophthalmology, Dentist, pulmonology given her oxygen requiring COPD, podiatrist at least yearly or according to recommendations, and advised to quit smoking (this patient smoked 60+ pack year starting from age 44). I have recommended yearly flu vaccine and pneumonia vaccine at least every 5 years; moderate intensity exercise for up to 150 minutes weekly; and  sleep for at least 7 hours a day.  - I advised patient to maintain close follow up with Roderic Scarce, MD for primary care needs.  - Time spent with the patient:  25 min, of which >50% was spent in reviewing her blood glucose logs , discussing her hypoglycemia and hyperglycemia episodes, reviewing her current  and  previous labs / studies and medications  doses and developing a plan to avoid hypoglycemia and hyperglycemia. Please refer to Patient Instructions for Blood Glucose Monitoring and Insulin/Medications Dosing Guide"  in media tab for additional information. Please  also refer to " Patient Self Inventory" in the Media  tab for reviewed elements of pertinent patient history.  Dana Dominguez participated in the discussions, expressed understanding, and voiced agreement with the above plans.  All questions were answered to her satisfaction. she is encouraged to contact clinic should she have any questions or concerns prior to her return visit.    Follow up plan: - Return in about 4 months (around 10/30/2019) for Bring Meter and Logs- A1c in Office.  Marquis Lunch, MD [facility] [address] [place], [place] [address] Phone: [phone]  Fax: [phone]    07/02/2019, 2:02 PM  This note was partially dictated with voice recognition software. Similar sounding words can be transcribed inadequately or may not  be corrected upon review.

## 2019-07-02 NOTE — Patient Instructions (Signed)

## 2019-08-08 ENCOUNTER — Other Ambulatory Visit: Payer: Self-pay | Admitting: "Endocrinology

## 2019-08-28 ENCOUNTER — Other Ambulatory Visit: Payer: Self-pay | Admitting: "Endocrinology

## 2019-09-25 ENCOUNTER — Other Ambulatory Visit: Payer: Self-pay | Admitting: "Endocrinology

## 2019-10-31 ENCOUNTER — Ambulatory Visit (INDEPENDENT_AMBULATORY_CARE_PROVIDER_SITE_OTHER): Payer: Federal, State, Local not specified - PPO | Admitting: "Endocrinology

## 2019-10-31 ENCOUNTER — Other Ambulatory Visit: Payer: Self-pay

## 2019-10-31 ENCOUNTER — Encounter: Payer: Self-pay | Admitting: "Endocrinology

## 2019-10-31 VITALS — BP 131/79 | HR 93 | Ht 63.0 in | Wt 167.2 lb

## 2019-10-31 DIAGNOSIS — E1165 Type 2 diabetes mellitus with hyperglycemia: Secondary | ICD-10-CM | POA: Diagnosis not present

## 2019-10-31 LAB — POCT GLYCOSYLATED HEMOGLOBIN (HGB A1C): Hemoglobin A1C: 8.1 % — AB (ref 4.0–5.6)

## 2019-10-31 MED ORDER — FREESTYLE LIBRE 14 DAY SENSOR MISC: 1.0000 | 2 refills | Status: DC

## 2019-10-31 MED ORDER — GLIPIZIDE ER 5 MG PO TB24: 5.0000 mg | ORAL_TABLET | Freq: Every day | ORAL | 3 refills | Status: DC

## 2019-10-31 MED ORDER — FREESTYLE LIBRE 14 DAY READER DEVI: 1.0000 | Freq: Once | 0 refills | Status: AC

## 2019-10-31 MED ORDER — INSULIN LISPRO (1 UNIT DIAL) 100 UNIT/ML (KWIKPEN): PEN_INJECTOR | SUBCUTANEOUS | 2 refills | Status: DC

## 2019-10-31 NOTE — Progress Notes (Signed)
10/31/2019, 2:53 PM   Endocrinology follow-up note   Subjective:    Patient ID: Dana Dominguez, female    DOB: 1956-07-31.  Dana Dominguez is being seen in follow-up for management of currently uncontrolled type 2 diabetes, hyperlipidemia, hypertension. PCP:   Virgina Norfolk, MD.   Past Medical History:  Diagnosis Date  . Back pain   . Diabetes (HCC)   . Dysphagia 09/29/2016  . GERD (gastroesophageal reflux disease)   . Heart murmur   . High cholesterol   . On supplemental oxygen therapy    3L [place]  at night   Past Surgical History:  Procedure Laterality Date  . BACK SURGERY     x 2 in the past  . carotid surgery     December 2015 rt   . CHOLECYSTECTOMY     [facility]  . COLON SURGERY     for diverticulitis  . ESOPHAGEAL DILATION N/A 11/21/2014   Procedure: ESOPHAGEAL DILATION;  Surgeon: Malissa Hippo, MD;  Location: [facility];  Service: Endoscopy;  Laterality: N/A;  . ESOPHAGOGASTRODUODENOSCOPY N/A 11/21/2014   Procedure: ESOPHAGOGASTRODUODENOSCOPY (EGD);  Surgeon: Malissa Hippo, MD;  Location: [facility];  Service: Endoscopy;  Laterality: N/A;  1200 - moved to 4/7 @ 9:00  . NASAL SINUS SURGERY    . NECK SURGERY    . stent in left arm     blockage 3 yrs ago.    Social History   Socioeconomic History  . Marital status: Married    Spouse name: Not on file  . Number of children: Not on file  . Years of education: Not on file  . Highest education level: Not on file  Occupational History  . Not on file  Tobacco Use  . Smoking status: Current Every Day Smoker    Packs/day: 2.00    Years: 50.00    Pack years: 100.00  . Smokeless tobacco: Never Used  . Tobacco comment: 1 pack day since 12 yrs  Substance and Sexual Activity  . Alcohol use: No    Alcohol/week: 0.0 standard drinks  . Drug use: No  . Sexual activity: Not on file  Other Topics Concern  . Not on file  Social History Narrative  . Not on file   Social Determinants of  Health   Financial Resource Strain:   . Difficulty of Paying Living Expenses:   Food Insecurity:   . Worried About Programme researcher, broadcasting/film/video in the Last Year:   . Barista in the Last Year:   Transportation Needs:   . Freight forwarder (Medical):   Marland Kitchen Lack of Transportation (Non-Medical):   Physical Activity:   . Days of Exercise per Week:   . Minutes of Exercise per Session:   Stress:   . Feeling of Stress :   Social Connections:   . Frequency of Communication with Friends and Family:   . Frequency of Social Gatherings with Friends and Family:   . Attends Religious Services:   . Active Member of Clubs or Organizations:   . Attends Banker Meetings:   Marland Kitchen Marital Status:    Outpatient Encounter Medications as of 10/31/2019  Medication Sig  . ARIPiprazole (ABILIFY) 5 MG tablet Take 5 mg by mouth daily.  Marland Kitchen aspirin EC 81 MG tablet Take 81 mg by mouth daily.  Marland Kitchen atorvastatin (LIPITOR) 80 MG tablet Take 80 mg by mouth daily.   . busPIRone (BUSPAR)  15 MG tablet Take 15 mg by mouth 3 (three) times daily.  . carvedilol (COREG) 3.125 MG tablet Take 3.125 mg by mouth daily.  . Continuous Blood Gluc Receiver (FREESTYLE LIBRE 14 DAY READER) DEVI 1 each by Does not apply route once for 1 dose.  . Continuous Blood Gluc Sensor (FREESTYLE LIBRE 14 DAY SENSOR) MISC Inject 1 each into the skin every 14 (fourteen) days. Use as directed.  . desloratadine (CLARINEX) 5 MG tablet Take 5 mg by mouth daily.  Marland Kitchen. dexlansoprazole (DEXILANT) 60 MG capsule Take 60 mg by mouth daily.  Marland Kitchen. dicyclomine (BENTYL) 10 MG capsule Take 10 mg by mouth 2 (two) times daily.  . DULoxetine (CYMBALTA) 60 MG capsule Take 60 mg by mouth 2 (two) times daily.  Marland Kitchen. glipiZIDE (GLUCOTROL XL) 5 MG 24 hr tablet Take 1 tablet (5 mg total) by mouth daily with breakfast.  . hydrOXYzine (ATARAX/VISTARIL) 50 MG tablet Take 50 mg by mouth 2 (two) times daily.  . Insulin Glargine (BASAGLAR KWIKPEN) 100 UNIT/ML SOPN INJECT 80  UNITS SUBCUTANEOUSLY AT BEDTIME  . insulin lispro (HUMALOG KWIKPEN) 100 UNIT/ML KwikPen INJECT 5-11 UNITS SUBCUTANEOUSLY 3 TIMES DAILY  . metFORMIN (GLUCOPHAGE-XR) 500 MG 24 hr tablet TAKE 2 TABLETS (1000MG  DOSE) BY MOUTH ONCE DAILY WITH BREAKFAST  . traZODone (DESYREL) 100 MG tablet Take 200 mg by mouth at bedtime.   . [DISCONTINUED] ANORO ELLIPTA 62.5-25 MCG/INH AEPB daily.  . [DISCONTINUED] glipiZIDE (GLUCOTROL XL) 2.5 MG 24 hr tablet TAKE 1 TABLET BY MOUTH DAILY WITH BREAKFAST.  . [DISCONTINUED] HUMALOG KWIKPEN 100 UNIT/ML KwikPen INJECT 10-16 UNITS SUBCUTANEOUSLY 3 TIMES DAILY   No facility-administered encounter medications on file as of 10/31/2019.    ALLERGIES: Allergies  Allergen Reactions  . Glipizide Nausea Only, Other (See Comments) and Rash    Cold sweats   . Codeine     Dizzy, nausea, cold sweat  . Plavix [Clopidogrel Bisulfate]     SOB  . Protonix [Pantoprazole Sodium]     ? nausea  . Sporanox [Itraconazole]     Major rash    VACCINATION STATUS:  There is no immunization history on file for this patient.  Diabetes She presents for her follow-up diabetic visit. She has type 2 diabetes mellitus. Onset time: She was diagnosed at approximate age of 64. Her disease course has been improving. There are no hypoglycemic associated symptoms. Pertinent negatives for hypoglycemia include no confusion, headaches, pallor or seizures. Associated symptoms include fatigue and foot paresthesias. Pertinent negatives for diabetes include no blurred vision, no chest pain, no polydipsia, no polyphagia and no polyuria. There are no hypoglycemic complications. Symptoms are improving. Diabetic complications include peripheral neuropathy. (Chronic heavy smoking.) Risk factors for coronary artery disease include diabetes mellitus, dyslipidemia, family history, hypertension, sedentary lifestyle, tobacco exposure and post-menopausal. Current diabetic treatments: She is taking Basaglar 28 units  nightly, Synjardy 12.12/998 mg p.o. daily, as well as Humulin are 2-10 units 3 times a day with meals. Her weight is fluctuating minimally. She is following a generally unhealthy diet. When asked about meal planning, she reported none. She has not had a previous visit with a dietitian (She declined referral.). 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  Cannot exercise optimally.) participates in exercise. Her breakfast blood glucose range is generally 140-180 mg/dl. Her lunch blood glucose range is generally 130-140 mg/dl. Her dinner blood glucose range is generally 130-140 mg/dl. Her bedtime blood glucose range is generally 140-180 mg/dl. Her overall blood glucose range is 140-180  mg/dl. (She presents with improved glycemic profile.  She did have some mild postprandial hypoglycemia.  Her point-of-care A1c is 8.1%, improving from 8.8%.) An ACE inhibitor/angiotensin II receptor blocker is not being taken. Eye exam is current.  Hyperlipidemia This is a chronic problem. The current episode started more than 1 year ago. The problem is controlled. Exacerbating diseases include diabetes. Pertinent negatives include no chest pain, myalgias or shortness of breath. Current antihyperlipidemic treatment includes statins. Risk factors for coronary artery disease include diabetes mellitus, dyslipidemia, family history, hypertension, a sedentary lifestyle and post-menopausal.  Hypertension This is a chronic problem. The current episode started more than 1 year ago. Pertinent negatives include no blurred vision, chest pain, headaches, palpitations or shortness of breath. Risk factors for coronary artery disease include dyslipidemia, diabetes mellitus, sedentary lifestyle, smoking/tobacco exposure and family history. Past treatments include beta blockers.     Review of Systems  Constitutional: Positive for fatigue. Negative for chills, fever and unexpected weight change.  HENT: Negative for trouble  swallowing and voice change.   Eyes: Negative for blurred vision and visual disturbance.  Respiratory: Positive for cough. Negative for shortness of breath and wheezing.   Cardiovascular: Negative for chest pain, palpitations and leg swelling.  Gastrointestinal: Negative for diarrhea, nausea and vomiting.  Endocrine: Negative for cold intolerance, heat intolerance, polydipsia, polyphagia and polyuria.  Musculoskeletal: Negative for arthralgias and myalgias.  Skin: Negative for color change, pallor, rash and wound.  Neurological: Negative for seizures and headaches.  Psychiatric/Behavioral: Positive for dysphoric mood. Negative for confusion and suicidal ideas.    Objective:    BP 131/79   Pulse 93   Ht 5\' 3"  (1.6 m)   Wt 167 lb 3.2 oz (75.8 kg)   BMI 29.62 kg/m   Wt Readings from Last 3 Encounters:  10/31/19 167 lb 3.2 oz (75.8 kg)  07/02/19 167 lb (75.8 kg)  08/29/18 175 lb (79.4 kg)      CMP     Component Value Date/Time   NA 131 (A) 04/11/2019 0000   K 4.6 08/22/2018 1147   CL 91 (L) 08/22/2018 1147   CO2 25 08/22/2018 1147   GLUCOSE 200 (H) 08/22/2018 1147   BUN 6 04/11/2019 0000   CREATININE 0.7 04/11/2019 0000   CREATININE 0.74 08/22/2018 1147   CALCIUM 9.1 08/22/2018 1147   PROT 7.5 08/22/2018 1147   ALBUMIN 3.7 08/22/2018 1147   AST 23 08/22/2018 1147   ALT 12 08/22/2018 1147   ALKPHOS 119 (H) 08/22/2018 1147   BILITOT 0.3 08/22/2018 1147   GFRNONAA 87 08/22/2018 1147   GFRAA 100 08/22/2018 1147     Diabetic Labs (most recent): Lab Results  Component Value Date   HGBA1C 8.1 (A) 10/31/2019   HGBA1C 8.8 (A) 07/02/2019   HGBA1C 8.6 (H) 01/12/2019     Lipid Panel ( most recent) Lipid Panel     Component Value Date/Time   CHOL 125 01/30/2018 0000   TRIG 122 01/30/2018 0000   HDL 50 01/30/2018 0000   LDLCALC 51 01/30/2018 0000      Lab Results  Component Value Date   TSH 2.65 04/11/2019   TSH 1.30 01/30/2018     Assessment & Plan:   1.  Uncontrolled type 2 diabetes mellitus with hyperglycemia (HCC)  - Dana Dominguez has currently uncontrolled symptomatic type 2 DM since 64 years of age. She presents with improved glycemic profile.  She did have some mild postprandial hypoglycemia.  Her point-of-care A1c is 8.1%, improving from  8.8%, overall improving from 11.8% overall.    -Her recent labs are reviewed with her.  -her diabetes is complicated by peripheral neuropathy and she remains at extremely high risk for more acute and chronic complications which include CAD, CVA, CKD, retinopathy, and neuropathy. These are all discussed in detail with her.  - I have counseled her on diet management and weight loss, by adopting a carbohydrate restricted/protein rich diet. - she  admits there is a room for improvement in her diet and drink choices. -  Suggestion is made for her to avoid simple carbohydrates  from her diet including Cakes, Sweet Desserts / Pastries, Ice Cream, Soda (diet and regular), Sweet Tea, Candies, Chips, Cookies, Sweet Pastries,  Store Bought Juices, Alcohol in Excess of  1-2 drinks a day, Artificial Sweeteners, Coffee Creamer, and "Sugar-free" Products. This will help patient to have stable blood glucose profile and potentially avoid unintended weight gain.  - I encouraged her to switch to  unprocessed or minimally processed complex starch and increased protein intake (animal or plant source), fruits, and vegetables.  - she is advised to stick to a routine mealtimes to eat 3 meals  a day and avoid unnecessary snacks ( to snack only to correct hypoglycemia).    - I have approached her with the following individualized plan to manage diabetes and patient agrees:   -Given her current and prevailing glycemic burden, she will continue to require intensive treatment with higher dose of basal/bolus insulin in order for her to achieve and maintain control of diabetes to target.   -She is advised to continue Basaglar 80 units  nightly, advised to lower Humalog to 5 units  3 times a day with meals  for pre-meal BG readings of 90-150mg /dl, plus patient specific correction dose for unexpected hyperglycemia above 150mg /dl, associated with strict monitoring of glucose 4 times a day-before meals and at bedtime. - Patient is warned not to take insulin without proper monitoring per orders. -Adjustment parameters are given for hypo and hyperglycemia in writing. - she is encouraged to call clinic for blood glucose levels less than 70 or above 200 mg /dl. -She is advised to continue metformin 1000 mg ER once a day after breakfast,  therapeutically suitable for patient . -She has tolerated and benefited from low-dose glipizide.  She is advised to increase her glipizide to 5 mg XL p.o. daily at breakfast.  - she is not a candidate for incretin therapy, SGLT2 inhibitors . She  is chronic heavy smoker.  - Patient specific target  A1c;  LDL, HDL, Triglycerides, and  Waist Circumference were discussed in detail.  2) BP/HTN: Her blood pressure is controlled to target.   she is advised to continue her current medications including carvedilol 3.125 mg p.o. twice with breakfast .  3) Lipids/HPL:   Review of her recent lipid panel showed LDL controlled at 51.   she  is advised to continue patient is advised to continue Lipitor 80 mg p.o. daily at bedtime.    Side effects and precautions discussed with her.  4)  Weight/Diet: Her current BMI is 97- clearly complicating her diabetes care.  She is a candidate for modest weight loss.  CDE Consult has been initiated.   Exercise, and detailed carbohydrates information provided  -  detailed on discharge instructions.  5) Chronic Care/Health Maintenance:  -she  Is on  Statin medications and  is encouraged to initiate and continue to follow up with Ophthalmology, Dentist, pulmonology given her oxygen requiring COPD,  podiatrist at least yearly or according to recommendations, and advised to quit  smoking (this patient smoked 60+ pack year starting from age 58). I have recommended yearly flu vaccine and pneumonia vaccine at least every 5 years; moderate intensity exercise for up to 150 minutes weekly; and  sleep for at least 7 hours a day.  - I advised patient to maintain close follow up with Virgina Norfolk, MD for primary care needs.  - Time spent on this patient care encounter:  35 min, of which > 50% was spent in  counseling and the rest reviewing her blood glucose logs , discussing her hypoglycemia and hyperglycemia episodes, reviewing her current and  previous labs / studies  ( including abstraction from other facilities) and medications  doses and developing a  long term treatment plan and documenting her care.   Please refer to Patient Instructions for Blood Glucose Monitoring and Insulin/Medications Dosing Guide"  in media tab for additional information. Please  also refer to " Patient Self Inventory" in the Media  tab for reviewed elements of pertinent patient history.  [name] participated in the discussions, expressed understanding, and voiced agreement with the above plans.  All questions were answered to her satisfaction. she is encouraged to contact clinic should she have any questions or concerns prior to her return visit.   Follow up plan: - Return in about 3 months (around 01/31/2020) for Bring Meter and Logs- A1c in Office, Follow up with Pre-visit Labs.  Marquis Lunch, MD [facility] [address] [place], [place] [address] Phone: [phone]  Fax: [phone]    10/31/2019, 2:53 PM  This note was partially dictated with voice recognition software. Similar sounding words can be transcribed inadequately or may not  be corrected upon review.

## 2019-10-31 NOTE — Patient Instructions (Signed)

## 2019-11-14 ENCOUNTER — Other Ambulatory Visit: Payer: Self-pay | Admitting: "Endocrinology

## 2019-12-17 ENCOUNTER — Other Ambulatory Visit: Payer: Self-pay | Admitting: "Endocrinology

## 2019-12-17 DIAGNOSIS — E1165 Type 2 diabetes mellitus with hyperglycemia: Secondary | ICD-10-CM

## 2019-12-18 ENCOUNTER — Other Ambulatory Visit: Payer: Self-pay | Admitting: "Endocrinology

## 2020-01-12 ENCOUNTER — Emergency Department (HOSPITAL_COMMUNITY)
Admission: EM | Admit: 2020-01-12 | Discharge: 2020-01-12 | Disposition: A | Discharge status: Home / Self Care | Payer: Federal, State, Local not specified - PPO | Attending: Emergency Medicine | Admitting: Emergency Medicine

## 2020-01-12 ENCOUNTER — Other Ambulatory Visit: Payer: Self-pay

## 2020-01-12 ENCOUNTER — Emergency Department (HOSPITAL_COMMUNITY): Payer: Federal, State, Local not specified - PPO

## 2020-01-12 ENCOUNTER — Encounter (HOSPITAL_COMMUNITY): Payer: Self-pay | Admitting: *Deleted

## 2020-01-12 DIAGNOSIS — Z79899 Other long term (current) drug therapy: Secondary | ICD-10-CM | POA: Diagnosis not present

## 2020-01-12 DIAGNOSIS — Z7984 Long term (current) use of oral hypoglycemic drugs: Secondary | ICD-10-CM | POA: Diagnosis not present

## 2020-01-12 DIAGNOSIS — Z7982 Long term (current) use of aspirin: Secondary | ICD-10-CM | POA: Insufficient documentation

## 2020-01-12 DIAGNOSIS — E119 Type 2 diabetes mellitus without complications: Secondary | ICD-10-CM | POA: Diagnosis not present

## 2020-01-12 DIAGNOSIS — R197 Diarrhea, unspecified: Secondary | ICD-10-CM | POA: Insufficient documentation

## 2020-01-12 DIAGNOSIS — F17210 Nicotine dependence, cigarettes, uncomplicated: Secondary | ICD-10-CM | POA: Diagnosis not present

## 2020-01-12 DIAGNOSIS — R1013 Epigastric pain: Secondary | ICD-10-CM

## 2020-01-12 DIAGNOSIS — R112 Nausea with vomiting, unspecified: Secondary | ICD-10-CM | POA: Diagnosis not present

## 2020-01-12 DIAGNOSIS — I10 Essential (primary) hypertension: Secondary | ICD-10-CM | POA: Insufficient documentation

## 2020-01-12 LAB — CBC WITH DIFFERENTIAL/PLATELET
Abs Immature Granulocytes: 0.1 10*3/uL — ABNORMAL HIGH (ref 0.00–0.07)
Basophils Absolute: 0.1 10*3/uL (ref 0.0–0.1)
Basophils Relative: 1 %
Eosinophils Absolute: 0.2 10*3/uL (ref 0.0–0.5)
Eosinophils Relative: 1 %
HCT: 53.1 % — ABNORMAL HIGH (ref 36.0–46.0)
Hemoglobin: 16.3 g/dL — ABNORMAL HIGH (ref 12.0–15.0)
Immature Granulocytes: 1 %
Lymphocytes Relative: 5 %
Lymphs Abs: 0.9 10*3/uL (ref 0.7–4.0)
MCH: 23.5 pg — ABNORMAL LOW (ref 26.0–34.0)
MCHC: 30.7 g/dL (ref 30.0–36.0)
MCV: 76.6 fL — ABNORMAL LOW (ref 80.0–100.0)
Monocytes Absolute: 0.6 10*3/uL (ref 0.1–1.0)
Monocytes Relative: 4 %
Neutro Abs: 13.7 10*3/uL — ABNORMAL HIGH (ref 1.7–7.7)
Neutrophils Relative %: 88 %
Platelets: 322 10*3/uL (ref 150–400)
RBC: 6.93 MIL/uL — ABNORMAL HIGH (ref 3.87–5.11)
RDW: 23.1 % — ABNORMAL HIGH (ref 11.5–15.5)
WBC: 15.6 10*3/uL — ABNORMAL HIGH (ref 4.0–10.5)
nRBC: 0 % (ref 0.0–0.2)

## 2020-01-12 LAB — URINALYSIS, ROUTINE W REFLEX MICROSCOPIC
Bilirubin Urine: NEGATIVE
Glucose, UA: 150 mg/dL — AB
Hgb urine dipstick: NEGATIVE
Ketones, ur: 20 mg/dL — AB
Nitrite: NEGATIVE
Protein, ur: >=300 mg/dL — AB
Specific Gravity, Urine: 1.014 (ref 1.005–1.030)
pH: 6 (ref 5.0–8.0)

## 2020-01-12 LAB — COMPREHENSIVE METABOLIC PANEL
ALT: 19 U/L (ref 0–44)
AST: 33 U/L (ref 15–41)
Albumin: 3.4 g/dL — ABNORMAL LOW (ref 3.5–5.0)
Alkaline Phosphatase: 106 U/L (ref 38–126)
Anion gap: 13 (ref 5–15)
BUN: 6 mg/dL — ABNORMAL LOW (ref 8–23)
CO2: 26 mmol/L (ref 22–32)
Calcium: 9.3 mg/dL (ref 8.9–10.3)
Chloride: 92 mmol/L — ABNORMAL LOW (ref 98–111)
Creatinine, Ser: 0.62 mg/dL (ref 0.44–1.00)
GFR calc Af Amer: >60 mL/min (ref >60)
GFR calc non Af Amer: >60 mL/min (ref >60)
Glucose, Bld: 199 mg/dL — ABNORMAL HIGH (ref 70–99)
Potassium: 3.9 mmol/L (ref 3.5–5.1)
Sodium: 131 mmol/L — ABNORMAL LOW (ref 135–145)
Total Bilirubin: 0.6 mg/dL (ref 0.3–1.2)
Total Protein: 8.3 g/dL — ABNORMAL HIGH (ref 6.5–8.1)

## 2020-01-12 LAB — LIPASE, BLOOD: Lipase: 16 U/L (ref 11–51)

## 2020-01-12 MED ORDER — METRONIDAZOLE 500 MG PO TABS
500.0000 mg | ORAL_TABLET | Freq: Two times a day (BID) | ORAL | 0 refills | Status: AC
Start: 2020-01-12 — End: 2020-01-19

## 2020-01-12 MED ORDER — ALUM & MAG HYDROXIDE-SIMETH 200-200-20 MG/5ML PO SUSP
30.0000 mL | Freq: Once | ORAL | Status: AC
  Administered 2020-01-12: 30 mL via ORAL
  Filled 2020-01-12: qty 30

## 2020-01-12 MED ORDER — LIDOCAINE VISCOUS HCL 2 % MT SOLN
15.0000 mL | Freq: Once | OROMUCOSAL | Status: AC
  Administered 2020-01-12: 15 mL via ORAL
  Filled 2020-01-12: qty 15

## 2020-01-12 MED ORDER — IOHEXOL 300 MG/ML  SOLN
100.0000 mL | Freq: Once | INTRAMUSCULAR | Status: AC | PRN
  Administered 2020-01-12: 100 mL via INTRAVENOUS

## 2020-01-12 MED ORDER — CIPROFLOXACIN HCL 500 MG PO TABS: 500.0000 mg | ORAL_TABLET | Freq: Two times a day (BID) | ORAL | 0 refills | Status: AC

## 2020-01-12 MED ORDER — SODIUM CHLORIDE 0.9 % IV BOLUS
500.0000 mL | Freq: Once | INTRAVENOUS | Status: AC
  Administered 2020-01-12: 500 mL via INTRAVENOUS

## 2020-01-12 MED ORDER — ONDANSETRON HCL 4 MG/2ML IJ SOLN
4.0000 mg | Freq: Once | INTRAMUSCULAR | Status: AC
  Administered 2020-01-12: 4 mg via INTRAVENOUS
  Filled 2020-01-12: qty 2

## 2020-01-12 MED ORDER — FENTANYL CITRATE (PF) 100 MCG/2ML IJ SOLN
50.0000 ug | Freq: Once | INTRAMUSCULAR | Status: AC
  Administered 2020-01-12: 50 ug via INTRAVENOUS
  Filled 2020-01-12: qty 2

## 2020-01-12 MED ORDER — ONDANSETRON HCL 4 MG PO TABS
4.0000 mg | ORAL_TABLET | Freq: Four times a day (QID) | ORAL | 0 refills | Status: DC
Start: 2020-01-12 — End: 2021-09-24

## 2020-01-12 NOTE — ED Provider Notes (Signed)
Patient presents with abdominal pain which seems to be fluctuating since Wednesday, there is been some nausea and vomiting and now some diarrhea.  On my exam she has a very soft abdomen, there is tenderness more in the left upper and the right lower quadrant, she has surgical scars with both a midline ex lap scar as well as a Pfannenstiel incision type scar.  She has no edema of her legs of any concern, she has a borderline tachycardia, clear lungs, does not appear to be in acute distress but does have abdominal tenderness with a leukocytosis.  Liver function test is unremarkable, she will need further evaluation with CT scan, patient agreeable.  Medical screening examination/treatment/procedure(s) were conducted as a shared visit with non-physician practitioner(s) and myself.  I personally evaluated the patient during the encounter.  Clinical Impression:   Final diagnoses:  Epigastric pain  Nausea vomiting and diarrhea         Eber Hong, MD 01/14/20 [phone]

## 2020-01-12 NOTE — Discharge Instructions (Addendum)
You are being treated for mild enteritis and will be given antibiotics please take as prescribed.  You will also be given Zofran for nausea take as needed.  I want you to start with a clear liquid diet for the rest of the day, if you have no nausea tomorrow you may advance your diet to soft foods.  If you experience no nausea after that you may advance your diet to a brat diet and if tolerating you may advance to a normal diet.  I want you to follow up with your GI doctor for further evaluation and management of this condition.  I want to come back to the hospital if you develop fever, uncontrolled vomiting, diarrhea, severe abdominal pain pain, shortness of breath or the symptoms require further evaluation.

## 2020-01-12 NOTE — ED Provider Notes (Signed)
[facility] EMERGENCY DEPARTMENT Provider Note   CSN: [id number] Arrival date & time: 01/12/20  1120     History Chief Complaint  Patient presents with  . Abdominal Pain    Dana Dominguez is a 64 y.o. female.  HPI   Patient presents to the emergency department chief complaint of abdominal pain, diarrhea, vomiting that started on Wednesday. Patient states that Wednesday she experienced epigastric pain that radiates to her back as well as diarrhea and vomiting. She describes this pain as a constant throb and food seems to make the pain worse and denies any alleviating factors. Patient denies seeing any blood in her vomit or diarrhea. She denies fever, urinary symptoms, or any sick contact. Denies use of alcohol, that her gallbladder removed, the use of NSAIDs, and has not been under a lot of stress. Patient does admit that her acid reflux has been worse over the last couple days spite using her acid pills. Patient has significant medical history of diabetes, heart murmur and GERD.  Past Medical History:  Diagnosis Date  . Back pain   . Diabetes (HCC)   . Dysphagia 09/29/2016  . GERD (gastroesophageal reflux disease)   . Heart murmur   . High cholesterol   . On supplemental oxygen therapy    3L [place]  at night    Patient Active Problem List   Diagnosis Date Noted  . Uncontrolled type 2 diabetes mellitus with hyperglycemia (HCC) 04/26/2018  . Mixed hyperlipidemia 04/26/2018  . Current smoker 04/26/2018  . Essential hypertension, benign 04/26/2018  . Dysphagia 09/29/2016  . Diabetes (HCC) 10/23/2014  . Carotid stenosis 10/23/2014    Past Surgical History:  Procedure Laterality Date  . BACK SURGERY     x 2 in the past  . carotid surgery     December 2015 rt   . CHOLECYSTECTOMY     [facility]stone  . COLON SURGERY     for diverticulitis  . ESOPHAGEAL DILATION N/A 11/21/2014   Procedure: ESOPHAGEAL DILATION;  Surgeon: Malissa Hippo, MD;  Location: [facility];  Service: Endoscopy;   Laterality: N/A;  . ESOPHAGOGASTRODUODENOSCOPY N/A 11/21/2014   Procedure: ESOPHAGOGASTRODUODENOSCOPY (EGD);  Surgeon: Malissa Hippo, MD;  Location: [facility];  Service: Endoscopy;  Laterality: N/A;  1200 - moved to 4/7 @ 9:00  . NASAL SINUS SURGERY    . NECK SURGERY    . stent in left arm     blockage 3 yrs ago.      OB History   No obstetric history on file.     History reviewed. No pertinent family history.  Social History   Tobacco Use  . Smoking status: Current Every Day Smoker    Packs/day: 2.00    Years: 50.00    Pack years: 100.00  . Smokeless tobacco: Never Used  . Tobacco comment: 1 pack day since 12 yrs  Substance Use Topics  . Alcohol use: No    Alcohol/week: 0.0 standard drinks  . Drug use: No    Home Medications Prior to Admission medications   Medication Sig Start Date End Date Taking? Authorizing Provider  ARIPiprazole (ABILIFY) 5 MG tablet Take 5 mg by mouth daily.    [provider]  aspirin EC 81 MG tablet Take 81 mg by mouth daily.    [provider]  atorvastatin (LIPITOR) 80 MG tablet Take 80 mg by mouth daily.     [provider]  busPIRone (BUSPAR) 15 MG tablet Take 15 mg by  mouth 3 (three) times daily.    [provider]  carvedilol (COREG) 3.125 MG tablet Take 3.125 mg by mouth daily.    [provider]  ciprofloxacin (CIPRO) 500 MG tablet Take 1 tablet (500 mg total) by mouth every 12 (twelve) hours for 7 days. 01/12/20 01/19/20  Carroll Sage, PA-C  Continuous Blood Gluc Sensor (FREESTYLE LIBRE 14 DAY SENSOR) MISC Inject 1 each into the skin every 14 (fourteen) days. Use as directed. 10/31/19   Roma Kayser, MD  desloratadine (CLARINEX) 5 MG tablet Take 5 mg by mouth daily.    [provider]  dexlansoprazole (DEXILANT) 60 MG capsule Take 60 mg by mouth daily.    [provider]  dicyclomine (BENTYL) 10 MG capsule Take 10 mg by mouth 2 (two) times daily.    [provider]  DULoxetine (CYMBALTA) 60 MG capsule Take 60 mg by mouth 2 (two) times daily.    [provider]  glipiZIDE (GLUCOTROL XL) 5 MG 24 hr tablet Take 1 tablet (5 mg total) by mouth daily with breakfast. 10/31/19   Nida, Denman George, MD  glipiZIDE (GLUCOTROL) 5 MG tablet Take 1 tablet (5 mg total) by mouth daily before breakfast. 12/18/19   Nida, Denman George, MD  hydrOXYzine (ATARAX/VISTARIL) 50 MG tablet Take 50 mg by mouth 2 (two) times daily.    [provider]  Insulin Glargine (BASAGLAR KWIKPEN) 100 UNIT/ML INJECT 80 UNITS SUBCUTANEOUSLY AT BEDTIME 12/18/19   Roma Kayser, MD  insulin lispro (HUMALOG KWIKPEN) 100 UNIT/ML KwikPen INJECT 5-11 UNITS SUBCUTANEOUSLY 3 TIMES DAILY 10/31/19   Roma Kayser, MD  metFORMIN (GLUCOPHAGE-XR) 500 MG 24 hr tablet TAKE 2 TABLETS (1000MG  DOSE) BY MOUTH ONCE DAILY WITH BREAKFAST 11/14/19   11/16/19, MD  metroNIDAZOLE (FLAGYL) 500 MG tablet Take 1 tablet (500 mg total) by mouth 2 (two) times daily for 7 days. 01/12/20 01/19/20  03/20/20, PA-C  ondansetron (ZOFRAN) 4 MG tablet Take 1 tablet (4 mg total) by mouth every 6 (six) hours. 01/12/20   01/14/20, PA-C  traZODone (DESYREL) 100 MG tablet Take 200 mg by mouth at bedtime.  10/22/14   [provider]    Allergies    Glipizide, Codeine, Plavix [clopidogrel bisulfate], Protonix [pantoprazole sodium], and Sporanox [itraconazole]  Review of Systems   Review of Systems  Constitutional: Positive for appetite change and chills. Negative for diaphoresis, fatigue and fever.  HENT: Negative for congestion and sore throat.   Eyes: Negative for pain.  Respiratory: Negative for cough and shortness of breath.   Cardiovascular: Negative for chest pain and leg swelling.  Gastrointestinal: Positive for abdominal pain, diarrhea, nausea and vomiting. Negative for anal bleeding, blood in stool, constipation and rectal pain.  Genitourinary:  Negative for dysuria, enuresis, flank pain, vaginal bleeding and vaginal discharge.  Musculoskeletal: Positive for back pain.  Skin: Negative for rash.  Neurological: Positive for headaches. Negative for dizziness and light-headedness.  Hematological: Does not bruise/bleed easily.    Physical Exam Updated Vital Signs BP (!) 120/56 (BP Location: Right Arm)   Pulse (!) 105   Temp 98.6 F (37 C)   Resp 16   Ht 5\' 3"  (1.6 m)   Wt 72.6 kg   SpO2 (!) 88%   BMI 28.34 kg/m   Physical Exam Vitals and nursing note reviewed.  Constitutional:      General: She is in acute distress.     Appearance: She is not ill-appearing.  HENT:     Head: Normocephalic and atraumatic.     Nose: No congestion.     Mouth/Throat:     Mouth: Mucous membranes are moist.     Pharynx: Oropharynx is clear.  Eyes:     General: No scleral icterus. Cardiovascular:     Rate and Rhythm: Normal rate and regular rhythm.     Pulses: Normal pulses.     Heart sounds: Murmur present. No friction rub. No gallop.   Pulmonary:     Effort: No respiratory distress.     Breath sounds: No wheezing, rhonchi or rales.  Abdominal:     General: There is no distension.     Palpations: Abdomen is soft.     Tenderness: There is abdominal tenderness. There is no right CVA tenderness, left CVA tenderness, guarding or rebound.     Comments: Patient's abdomen is nondistended, normal active bowel sounds, dull to percussion, patient had tenderness upon palpation on her epigastric region. She also explained that she has several back pain which she believes radiates from her stomach.  Patient has previous surgical scars, no bruising, rashes, or other abnormalities noted.  Musculoskeletal:        General: No swelling.  Skin:    General: Skin is warm and dry.     Findings: No rash.  Neurological:     Mental Status: She is alert and oriented to person, place, and time.  Psychiatric:        Mood and Affect: Mood normal.     ED  Results / Procedures / Treatments   Labs (all labs ordered are listed, but only abnormal results are displayed) Labs Reviewed  CBC WITH DIFFERENTIAL/PLATELET - Abnormal; Notable for the following components:      Result Value   WBC 15.6 (*)    RBC 6.93 (*)    Hemoglobin 16.3 (*)    HCT 53.1 (*)    MCV 76.6 (*)    MCH 23.5 (*)    RDW 23.1 (*)    Neutro Abs 13.7 (*)    Abs Immature Granulocytes 0.10 (*)    All other components within normal limits  COMPREHENSIVE METABOLIC PANEL - Abnormal; Notable for the following components:   Sodium 131 (*)    Chloride 92 (*)    Glucose, Bld 199 (*)    BUN 6 (*)    Total Protein 8.3 (*)    Albumin 3.4 (*)    All other components within normal limits  URINALYSIS, ROUTINE W REFLEX MICROSCOPIC - Abnormal; Notable for the following components:   APPearance HAZY (*)    Glucose, UA 150 (*)    Ketones, ur 20 (*)    Protein, ur >=300 (*)    Leukocytes,Ua TRACE (*)    Bacteria, UA RARE (*)    All other components within normal limits  LIPASE, BLOOD    EKG None  Radiology CT ABDOMEN PELVIS W CONTRAST  Result Date: 01/12/2020 CLINICAL DATA:  Nausea and vomiting for several days EXAM: CT ABDOMEN AND PELVIS WITH CONTRAST TECHNIQUE: Multidetector CT imaging of the abdomen and pelvis was performed using the standard protocol following bolus administration of intravenous contrast. CONTRAST:  144mL OMNIPAQUE IOHEXOL 300 MG/ML  SOLN COMPARISON:  None. FINDINGS: Lower chest: No acute abnormality. Hepatobiliary: No focal liver abnormality is seen. Status post cholecystectomy. No biliary dilatation. Pancreas: Unremarkable. No pancreatic ductal dilatation or surrounding inflammatory changes. Spleen: Normal in size without focal abnormality. Adrenals/Urinary Tract: Adrenal glands are within normal limits. Kidneys  demonstrate a normal enhancement pattern bilaterally. Normal excretion is seen bilaterally. The bladder is decompressed. Stomach/Bowel: Postsurgical  changes are noted in the sigmoid colon. The more proximal colon is decompressed. No inflammatory or obstructive changes are seen. The appendix is not well visualized. No inflammatory changes are seen. The stomach is decompressed. Mild wall thickening is noted in the proximal jejunum which may represent some mild enteritis. No true obstructive changes are seen. Vascular/Lymphatic: Aortic atherosclerosis. No enlarged abdominal or pelvic lymph nodes. Reproductive: Uterus and bilateral adnexa are unremarkable. Changes of tubal ligation are noted. Other: No abdominal wall hernia or abnormality. No abdominopelvic ascites. Musculoskeletal: Degenerative and postoperative changes of lumbar spine are noted. IMPRESSION: Mild prominence of the proximal jejunal wall which may represent some mild enteritis. No obstructive changes are seen. No other focal abnormality is noted. Electronically Signed   By: Alcide CleverMark  Lukens M.D.   On: 01/12/2020 14:19    Procedures Procedures (including critical care time)  Medications Ordered in ED Medications  sodium chloride 0.9 % bolus 500 mL (0 mLs Intravenous Stopped 01/12/20 1301)  ondansetron (ZOFRAN) injection 4 mg (4 mg Intravenous Given 01/12/20 1209)  fentaNYL (SUBLIMAZE) injection 50 mcg (50 mcg Intravenous Given 01/12/20 1209)  alum & mag hydroxide-simeth (MAALOX/MYLANTA) 200-200-20 MG/5ML suspension 30 mL (30 mLs Oral Given 01/12/20 1209)    And  lidocaine (XYLOCAINE) 2 % viscous mouth solution 15 mL (15 mLs Oral Given 01/12/20 1209)  fentaNYL (SUBLIMAZE) injection 50 mcg (50 mcg Intravenous Given 01/12/20 1352)  sodium chloride 0.9 % bolus 500 mL (0 mLs Intravenous Stopped 01/12/20 1527)  iohexol (OMNIPAQUE) 300 MG/ML solution 100 mL (100 mLs Intravenous Contrast Given 01/12/20 1357)    ED Course  I have reviewed the triage vital signs and the nursing notes.  Pertinent labs & imaging results that were available during my care of the patient were reviewed by me and considered  in my medical decision making (see chart for details).    MDM Rules/Calculators/A&P                      I have personally reviewed all imaging, labs and have interpreted them.  Patient's complaint of acute onset of abdominal pain with nausea, vomiting, diarrhea most concerned for pancreatitis, pyelonephritis, stomach ulcer with possible rupture.  I have low suspicion for pancreatitis as patient's has a lipase of 26 and low risk factors (does not drink or a gallbladder).  Unlikely that patient has a rupture or obstruction as patient's abdomen was non-acute and she is afebrile, admits to frequent diarrhea and flatulence.  CT abdomen showed mild prominence of the proximal jejunal wall representing some mild enteritis with no other acute findings making pancreatitis, pyelonephritis, kidney stones, rupture unlikely.   UA does not show signs of infection or  hemoglobin making UTI and kidney stones unlikely.  Due to the patient's leukocytosis with a shift in neutrophils in the presence of mild enteritis I am going to place the patient on antibiotics.  Patient states she is feeling much better after giving fluids, GI cocktail, pain meds and Zofran.  She denies any nausea and denies abdominal pain.  Patient does not meet criteria for emergent admission to the hospital.  Patient was given at home care instructions, strict return precautions.  Patient was explained results and plan and patient verbalized that she agrees with above plan.  Final Clinical Impression(s) / ED Diagnoses Final diagnoses:  Epigastric pain  Nausea vomiting and diarrhea    Rx /  DC Orders ED Discharge Orders         Ordered    ondansetron (ZOFRAN) 4 MG tablet  Every 6 hours     01/12/20 1512    ciprofloxacin (CIPRO) 500 MG tablet  Every 12 hours     01/12/20 1515    metroNIDAZOLE (FLAGYL) 500 MG tablet  2 times daily     01/12/20 1515           Barnie Del 01/12/20 1530    Eber Hong, MD 01/14/20  [phone]

## 2020-01-12 NOTE — ED Triage Notes (Signed)
Pt with abd pain with N/V/D since Wednesday.   Pt states it went away and returned.  Pt states she has received first covid vaccine.

## 2020-01-15 ENCOUNTER — Other Ambulatory Visit: Payer: Self-pay | Admitting: "Endocrinology

## 2020-01-29 ENCOUNTER — Other Ambulatory Visit: Payer: Self-pay | Admitting: "Endocrinology

## 2020-01-30 LAB — CBC/DIFF AMBIGUOUS DEFAULT
Basophils Absolute: 0.2 10*3/uL (ref 0.0–0.2)
Basos: 2 %
EOS (ABSOLUTE): 0.3 10*3/uL (ref 0.0–0.4)
Eos: 3 %
Hematocrit: 52.1 % — ABNORMAL HIGH (ref 34.0–46.6)
Hemoglobin: 16.1 g/dL — ABNORMAL HIGH (ref 11.1–15.9)
Immature Grans (Abs): 0.1 10*3/uL (ref 0.0–0.1)
Immature Granulocytes: 1 %
Lymphocytes Absolute: 2.8 10*3/uL (ref 0.7–3.1)
Lymphs: 22 %
MCH: 23.4 pg — ABNORMAL LOW (ref 26.6–33.0)
MCHC: 30.9 g/dL — ABNORMAL LOW (ref 31.5–35.7)
MCV: 76 fL — ABNORMAL LOW (ref 79–97)
Monocytes Absolute: 0.9 10*3/uL (ref 0.1–0.9)
Monocytes: 7 %
Neutrophils Absolute: 8.2 10*3/uL — ABNORMAL HIGH (ref 1.4–7.0)
Neutrophils: 65 %
Platelets: 395 10*3/uL (ref 150–450)
RBC: 6.88 x10E6/uL — ABNORMAL HIGH (ref 3.77–5.28)
RDW: 21.1 % — ABNORMAL HIGH (ref 11.7–15.4)
WBC: 12.5 10*3/uL — ABNORMAL HIGH (ref 3.4–10.8)

## 2020-01-30 LAB — LIPID PANEL W/O CHOL/HDL RATIO
Cholesterol, Total: 137 mg/dL (ref 100–199)
HDL: 55 mg/dL (ref >39)
LDL Chol Calc (NIH): 55 mg/dL (ref 0–99)
Triglycerides: 163 mg/dL — ABNORMAL HIGH (ref 0–149)
VLDL Cholesterol Cal: 27 mg/dL (ref 5–40)

## 2020-01-30 LAB — MICROALBUMIN / CREATININE URINE RATIO

## 2020-01-30 LAB — TSH RFX ON ABNORMAL TO FREE T4: TSH: 1.48 u[IU]/mL (ref 0.450–4.500)

## 2020-02-05 ENCOUNTER — Other Ambulatory Visit: Payer: Self-pay

## 2020-02-05 ENCOUNTER — Encounter: Payer: Self-pay | Admitting: "Endocrinology

## 2020-02-05 ENCOUNTER — Ambulatory Visit (INDEPENDENT_AMBULATORY_CARE_PROVIDER_SITE_OTHER): Payer: Federal, State, Local not specified - PPO | Admitting: "Endocrinology

## 2020-02-05 VITALS — BP 111/71 | HR 92 | Ht 63.0 in | Wt 157.2 lb

## 2020-02-05 DIAGNOSIS — E1165 Type 2 diabetes mellitus with hyperglycemia: Secondary | ICD-10-CM | POA: Diagnosis not present

## 2020-02-05 DIAGNOSIS — E782 Mixed hyperlipidemia: Secondary | ICD-10-CM

## 2020-02-05 DIAGNOSIS — I10 Essential (primary) hypertension: Secondary | ICD-10-CM

## 2020-02-05 LAB — POCT GLYCOSYLATED HEMOGLOBIN (HGB A1C): Hemoglobin A1C: 7.5 % — AB (ref 4.0–5.6)

## 2020-02-05 MED ORDER — FREESTYLE LIBRE 14 DAY SENSOR MISC: 1.0000 | 2 refills | Status: DC

## 2020-02-05 MED ORDER — GLIPIZIDE ER 5 MG PO TB24: 5.0000 mg | ORAL_TABLET | Freq: Every day | ORAL | 3 refills | Status: DC

## 2020-02-05 NOTE — Patient Instructions (Signed)

## 2020-02-05 NOTE — Progress Notes (Signed)
02/05/2020, 5:22 PM   Endocrinology follow-up note   Subjective:    Patient ID: Dana Dominguez, female    DOB: [birth year].  Dana Dominguez is being seen in follow-up for management of currently uncontrolled type 2 diabetes, hyperlipidemia, hypertension. PCP:   Dana Norfolk, MD.   Past Medical History:  Diagnosis Date  . Back pain   . Diabetes (HCC)   . Dysphagia 09/29/2016  . GERD (gastroesophageal reflux disease)   . Heart murmur   . High cholesterol   . On supplemental oxygen therapy    3L [place]  at night   Past Surgical History:  Procedure Laterality Date  . BACK SURGERY     x 2 in the past  . carotid surgery     December 2015 rt   . CHOLECYSTECTOMY     [facility]  . COLON SURGERY     for diverticulitis  . ESOPHAGEAL DILATION N/A 11/21/2014   Procedure: ESOPHAGEAL DILATION;  Surgeon: Dana Hippo, MD;  Location: [facility];  Service: Endoscopy;  Laterality: N/A;  . ESOPHAGOGASTRODUODENOSCOPY N/A 11/21/2014   Procedure: ESOPHAGOGASTRODUODENOSCOPY (EGD);  Surgeon: Dana Hippo, MD;  Location: [facility];  Service: Endoscopy;  Laterality: N/A;  1200 - moved to 4/7 @ 9:00  . NASAL SINUS SURGERY    . NECK SURGERY    . stent in left arm     blockage 3 yrs ago.    Social History   Socioeconomic History  . Marital status: Married    Spouse name: Not on file  . Number of children: Not on file  . Years of education: Not on file  . Highest education level: Not on file  Occupational History  . Not on file  Tobacco Use  . Smoking status: Current Every Day Smoker    Packs/day: 2.00    Years: 50.00    Pack years: 100.00  . Smokeless tobacco: Never Used  . Tobacco comment: 1 pack day since 12 yrs  Vaping Use  . Vaping Use: Never used  Substance and Sexual Activity  . Alcohol use: No    Alcohol/week: 0.0 standard drinks  . Drug use: No  . Sexual activity: Not on file  Other Topics Concern  . Not on file  Social History Narrative  . Not  on file   Social Determinants of Health   Financial Resource Strain:   . Difficulty of Paying Living Expenses:   Food Insecurity:   . Worried About Programme researcher, broadcasting/film/video in the Last Year:   . Barista in the Last Year:   Transportation Needs:   . Freight forwarder (Medical):   Marland Kitchen Lack of Transportation (Non-Medical):   Physical Activity:   . Days of Exercise per Week:   . Minutes of Exercise per Session:   Stress:   . Feeling of Stress :   Social Connections:   . Frequency of Communication with Friends and Family:   . Frequency of Social Gatherings with Friends and Family:   . Attends Religious Services:   . Active Member of Clubs or Organizations:   . Attends Banker Meetings:   Marland Kitchen Marital Status:    Outpatient Encounter Medications as of 02/05/2020  Medication Sig  . ARIPiprazole (ABILIFY) 5 MG tablet Take 5 mg by mouth daily.  Marland Kitchen aspirin EC 81 MG tablet Take 81 mg by mouth daily.  Marland Kitchen atorvastatin (LIPITOR) 80 MG tablet Take 80  mg by mouth daily.   . busPIRone (BUSPAR) 15 MG tablet Take 15 mg by mouth 3 (three) times daily.  . carvedilol (COREG) 3.125 MG tablet Take 3.125 mg by mouth daily.  . Continuous Blood Gluc Sensor (FREESTYLE LIBRE 14 DAY SENSOR) MISC Inject 1 each into the skin every 14 (fourteen) days. Use as directed.  . desloratadine (CLARINEX) 5 MG tablet Take 5 mg by mouth daily.  Marland Kitchen. dexlansoprazole (DEXILANT) 60 MG capsule Take 60 mg by mouth daily.  Marland Kitchen. dicyclomine (BENTYL) 10 MG capsule Take 10 mg by mouth 2 (two) times daily.  . DULoxetine (CYMBALTA) 60 MG capsule Take 60 mg by mouth 2 (two) times daily.  . hydrOXYzine (ATARAX/VISTARIL) 50 MG tablet Take 50 mg by mouth 2 (two) times daily.  . Insulin Glargine (BASAGLAR KWIKPEN) 100 UNIT/ML INJECT 80 UNITS SUBCUTANEOUSLY AT BEDTIME  . metFORMIN (GLUCOPHAGE-XR) 500 MG 24 hr tablet TAKE 2 TABLETS (1000MG  DOSE) BY MOUTH ONCE DAILY WITH BREAKFAST  . ondansetron (ZOFRAN) 4 MG tablet Take 1 tablet (4  mg total) by mouth every 6 (six) hours.  . traZODone (DESYREL) 100 MG tablet Take 200 mg by mouth at bedtime.   . [DISCONTINUED] Continuous Blood Gluc Sensor (FREESTYLE LIBRE 14 DAY SENSOR) MISC INJECT 1 EACH INTO THE SKIN EVERY 14 (FOURTEEN) DAYS. USE AS DIRECTED.  . [DISCONTINUED] glipiZIDE (GLUCOTROL) 5 MG tablet Take 1 tablet (5 mg total) by mouth daily before breakfast.  . [DISCONTINUED] insulin lispro (HUMALOG KWIKPEN) 100 UNIT/ML KwikPen INJECT 5-11 UNITS SUBCUTANEOUSLY 3 TIMES DAILY  . glipiZIDE (GLUCOTROL XL) 5 MG 24 hr tablet Take 1 tablet (5 mg total) by mouth daily with breakfast.  . [DISCONTINUED] glipiZIDE (GLUCOTROL XL) 5 MG 24 hr tablet Take 1 tablet (5 mg total) by mouth daily with breakfast. (Patient not taking: Reported on 02/05/2020)   No facility-administered encounter medications on file as of 02/05/2020.    ALLERGIES: Allergies  Allergen Reactions  . Glipizide Nausea Only, Other (See Comments) and Rash    Cold sweats   . Codeine     Dizzy, nausea, cold sweat  . Plavix [Clopidogrel Bisulfate]     SOB  . Protonix [Pantoprazole Sodium]     ? nausea  . Sporanox [Itraconazole]     Major rash    VACCINATION STATUS:  There is no immunization history on file for this patient.  Diabetes She presents for her follow-up diabetic visit. She has type 2 diabetes mellitus. Onset time: She was diagnosed at approximate age of 64. Her disease course has been improving. There are no hypoglycemic associated symptoms. Pertinent negatives for hypoglycemia include no confusion, headaches, pallor or seizures. Associated symptoms include fatigue, foot paresthesias, polydipsia and polyuria. Pertinent negatives for diabetes include no blurred vision, no chest pain and no polyphagia. There are no hypoglycemic complications. Symptoms are improving. Diabetic complications include peripheral neuropathy. (Chronic heavy smoking.) Risk factors for coronary artery disease include diabetes mellitus,  dyslipidemia, family history, hypertension, sedentary lifestyle, tobacco exposure and post-menopausal. Current diabetic treatments: She is taking Basaglar 28 units nightly, Synjardy 12.12/998 mg p.o. daily, as well as Humulin are 2-10 units 3 times a day with meals. Her weight is fluctuating minimally. She is following a generally unhealthy diet. When asked about meal planning, she reported none. She has not had a previous visit with a dietitian (She declined referral.). 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  Cannot exercise optimally.) participates in exercise. Her home blood glucose trend is decreasing steadily. Her breakfast  blood glucose range is generally 130-140 mg/dl. Her lunch blood glucose range is generally 140-180 mg/dl. Her dinner blood glucose range is generally 140-180 mg/dl. Her bedtime blood glucose range is generally 140-180 mg/dl. Her overall blood glucose range is 140-180 mg/dl. (She presents with improved glycemic profile.  She did have some mild, rare and random, hypoglycemia.  Her point-of-care A1c is  7.5% , improving from 8.8%. her CGM reveals 70% TIR, 20 % above target.) An ACE inhibitor/angiotensin II receptor blocker is not being taken. Eye exam is current.  Hyperlipidemia This is a chronic problem. The current episode started more than 1 year ago. The problem is controlled. Exacerbating diseases include diabetes. Pertinent negatives include no chest pain, myalgias or shortness of breath. Current antihyperlipidemic treatment includes statins. Risk factors for coronary artery disease include diabetes mellitus, dyslipidemia, family history, hypertension, a sedentary lifestyle and post-menopausal.  Hypertension This is a chronic problem. The current episode started more than 1 year ago. Pertinent negatives include no blurred vision, chest pain, headaches, palpitations or shortness of breath. Risk factors for coronary artery disease include dyslipidemia, diabetes  mellitus, sedentary lifestyle, smoking/tobacco exposure and family history. Past treatments include beta blockers.     Review of Systems  Constitutional: Positive for fatigue. Negative for chills, fever and unexpected weight change.  HENT: Negative for trouble swallowing and voice change.   Eyes: Negative for blurred vision and visual disturbance.  Respiratory: Positive for cough. Negative for shortness of breath and wheezing.   Cardiovascular: Negative for chest pain, palpitations and leg swelling.  Gastrointestinal: Negative for diarrhea, nausea and vomiting.  Endocrine: Positive for polydipsia and polyuria. Negative for cold intolerance, heat intolerance and polyphagia.  Musculoskeletal: Negative for arthralgias and myalgias.  Skin: Negative for color change, pallor, rash and wound.  Neurological: Negative for seizures and headaches.  Psychiatric/Behavioral: Positive for dysphoric mood. Negative for confusion and suicidal ideas.    Objective:    BP 111/71 (BP Location: Left Arm, Patient Position: Sitting)   Pulse 92   Ht 5\' 3"  (1.6 m)   Wt 157 lb 3.2 oz (71.3 kg)   BMI 27.85 kg/m   Wt Readings from Last 3 Encounters:  02/05/20 157 lb 3.2 oz (71.3 kg)  01/12/20 160 lb (72.6 kg)  10/31/19 167 lb 3.2 oz (75.8 kg)     Physical Exam- Limited  Constitutional:  Body mass index is 27.85 kg/m. , not in acute distress, normal state of mind Eyes:  EOMI, no exophthalmos Neck: Supple Thyroid: No gross goiter Respiratory: Adequate breathing efforts Musculoskeletal: no gross deformities, strength intact in all four extremities, no gross restriction of joint movements Skin:  no rashes, no hyperemia Neurological: no tremor with outstretched hands,    CMP     Component Value Date/Time   NA 131 (L) 01/12/2020 1216   NA 131 (A) 04/11/2019 0000   K 3.9 01/12/2020 1216   CL 92 (L) 01/12/2020 1216   CO2 26 01/12/2020 1216   GLUCOSE 199 (H) 01/12/2020 1216   BUN 6 (L) 01/12/2020 1216    BUN 6 04/11/2019 0000   CREATININE 0.62 01/12/2020 1216   CALCIUM 9.3 01/12/2020 1216   PROT 8.3 (H) 01/12/2020 1216   PROT 7.5 08/22/2018 1147   ALBUMIN 3.4 (L) 01/12/2020 1216   ALBUMIN 3.7 08/22/2018 1147   AST 33 01/12/2020 1216   ALT 19 01/12/2020 1216   ALKPHOS 106 01/12/2020 1216   BILITOT 0.6 01/12/2020 1216   BILITOT 0.3 08/22/2018 1147   GFRNONAA >60  01/12/2020 1216   GFRAA >60 01/12/2020 1216     Diabetic Labs (most recent): Lab Results  Component Value Date   HGBA1C 7.5 (A) 02/05/2020   HGBA1C 8.1 (A) 10/31/2019   HGBA1C 8.8 (A) 07/02/2019     Lipid Panel ( most recent) Lipid Panel     Component Value Date/Time   CHOL 137 01/29/2020 1026   TRIG 163 (H) 01/29/2020 1026   HDL 55 01/29/2020 1026   LDLCALC 55 01/29/2020 1026      Lab Results  Component Value Date   TSH 2.65 04/11/2019   TSH 1.30 01/30/2018     Assessment & Plan:   1. Uncontrolled type 2 diabetes mellitus with hyperglycemia (HCC)  - Dana Dominguez has currently uncontrolled symptomatic type 2 DM since 64 years of age.  She presents with improved glycemic profile.  She did have some mild, rare and random, hypoglycemia.  Her point-of-care A1c is  7.5% , improving from 11.8%. her CGM reveals 70% TIR, 20 % above target.   -Her recent labs are reviewed with her.  -her diabetes is complicated by peripheral neuropathy and she remains at extremely high risk for more acute and chronic complications which include CAD, CVA, CKD, retinopathy, and neuropathy. These are all discussed in detail with her.  - I have counseled her on diet management and weight loss, by adopting a carbohydrate restricted/protein rich diet.  - she  admits there is a room for improvement in her diet and drink choices. -  Suggestion is made for her to avoid simple carbohydrates  from her diet including Cakes, Sweet Desserts / Pastries, Ice Cream, Soda (diet and regular), Sweet Tea, Candies, Chips, Cookies, Sweet Pastries,   Store Bought Juices, Alcohol in Excess of  1-2 drinks a day, Artificial Sweeteners, Coffee Creamer, and "Sugar-free" Products. This will help patient to have stable blood glucose profile and potentially avoid unintended weight gain.   - I encouraged her to switch to  unprocessed or minimally processed complex starch and increased protein intake (animal or plant source), fruits, and vegetables.  - she is advised to stick to a routine mealtimes to eat 3 meals  a day and avoid unnecessary snacks ( to snack only to correct hypoglycemia).    - I have approached her with the following individualized plan to manage diabetes and patient agrees:   -Given her current and prevailing glycemic burden, she will continue to require intensive treatment with higher dose of basal/bolus insulin in order for her to achieve and maintain control of diabetes to target.   -She is advised to continue Basaglar 80 units nightly, advised to hold  Humalog for now,  associated with strict monitoring of glucose 4 times a day-before meals and at bedtime. - Patient is warned not to take insulin without proper monitoring per orders. -Adjustment parameters are given for hypo and hyperglycemia in writing. - she is encouraged to call clinic for blood glucose levels less than 70 or above 200 mg /dl. -She is advised to continue metformin 1000 mg ER once a day after breakfast,  therapeutically suitable for patient . -She has tolerated and benefited from low-dose glipizide.  She is advised to increase her glipizide to 5 mg XL p.o. daily at breakfast.  - she is not a candidate for incretin therapy, SGLT2 inhibitors . She  is chronic heavy smoker.  - Patient specific target  A1c;  LDL, HDL, Triglycerides, and  Waist Circumference were discussed in detail.  2) BP/HTN:  Her  blood pressure is controlled to target.   she is advised to continue her current medications including carvedilol 3.125 mg p.o. twice with breakfast .  3)  Lipids/HPL:   Review of her recent lipid panel showed LDL controlled at 55.     she  is advised to continue patient is advised to continue Lipitor 80 mg p.o. daily at bedtime.    Side effects and precautions discussed with her.  4)  Weight/Diet: Her current BMI is 17.4 - clearly complicating her diabetes care.  She is a candidate for modest weight loss.  CDE Consult has been initiated.   Exercise, and detailed carbohydrates information provided  -  detailed on discharge instructions.  5) Chronic Care/Health Maintenance:  -she  Is on  Statin medications and  is encouraged to initiate and continue to follow up with Ophthalmology, Dentist, pulmonology given her oxygen requiring COPD, podiatrist at least yearly or according to recommendations, and advised to quit smoking (this patient smoked 60+ pack year starting from age 81). I have recommended yearly flu vaccine and pneumonia vaccine at least every 5 years; moderate intensity exercise for up to 150 minutes weekly; and  sleep for at least 7 hours a day.  - I advised patient to maintain close follow up with Roderic Scarce, MD for primary care needs.  - Time spent on this patient care encounter:  35 min, of which > 50% was spent in  counseling and the rest reviewing her blood glucose logs , discussing her hypoglycemia and hyperglycemia episodes, reviewing her current and  previous labs / studies  ( including abstraction from other facilities) and medications  doses and developing a  long term treatment plan and documenting her care.   Please refer to Patient Instructions for Blood Glucose Monitoring and Insulin/Medications Dosing Guide"  in media tab for additional information. Please  also refer to " Patient Self Inventory" in the Media  tab for reviewed elements of pertinent patient history.  Marianna Payment participated in the discussions, expressed understanding, and voiced agreement with the above plans.  All questions were answered to her satisfaction.  she is encouraged to contact clinic should she have any questions or concerns prior to her return visit.   Follow up plan: - Return in about 4 months (around 06/06/2020) for Bring Meter and Logs- A1c in Office.  Glade Lloyd, MD [facility] [address] [place], [place] [address] Phone: [phone]  Fax: [phone]    02/05/2020, 5:22 PM  This note was partially dictated with voice recognition software. Similar sounding words can be transcribed inadequately or may not  be corrected upon review.

## 2020-02-08 ENCOUNTER — Other Ambulatory Visit: Payer: Self-pay | Admitting: "Endocrinology

## 2020-03-12 ENCOUNTER — Other Ambulatory Visit: Payer: Self-pay | Admitting: "Endocrinology

## 2020-04-09 ENCOUNTER — Other Ambulatory Visit: Payer: Self-pay | Admitting: "Endocrinology

## 2020-04-30 ENCOUNTER — Other Ambulatory Visit: Payer: Self-pay | Admitting: "Endocrinology

## 2020-05-05 ENCOUNTER — Other Ambulatory Visit: Payer: Self-pay | Admitting: "Endocrinology

## 2020-05-07 ENCOUNTER — Ambulatory Visit: Payer: Federal, State, Local not specified - PPO | Admitting: Nurse Practitioner

## 2020-05-07 ENCOUNTER — Ambulatory Visit: Payer: Federal, State, Local not specified - PPO | Admitting: "Endocrinology

## 2020-05-22 ENCOUNTER — Ambulatory Visit: Payer: Federal, State, Local not specified - PPO | Admitting: Nurse Practitioner

## 2020-06-05 ENCOUNTER — Encounter: Payer: Self-pay | Admitting: Nurse Practitioner

## 2020-06-05 ENCOUNTER — Other Ambulatory Visit: Payer: Self-pay

## 2020-06-05 ENCOUNTER — Ambulatory Visit: Payer: Federal, State, Local not specified - PPO | Admitting: Nurse Practitioner

## 2020-06-05 ENCOUNTER — Ambulatory Visit (INDEPENDENT_AMBULATORY_CARE_PROVIDER_SITE_OTHER): Payer: Federal, State, Local not specified - PPO | Admitting: Nurse Practitioner

## 2020-06-05 VITALS — BP 101/67 | HR 93 | Wt 152.4 lb

## 2020-06-05 DIAGNOSIS — E782 Mixed hyperlipidemia: Secondary | ICD-10-CM

## 2020-06-05 DIAGNOSIS — I10 Essential (primary) hypertension: Secondary | ICD-10-CM | POA: Diagnosis not present

## 2020-06-05 DIAGNOSIS — E1165 Type 2 diabetes mellitus with hyperglycemia: Secondary | ICD-10-CM

## 2020-06-05 LAB — POCT GLYCOSYLATED HEMOGLOBIN (HGB A1C): Hemoglobin A1C: 7.5 % — AB (ref 4.0–5.6)

## 2020-06-05 MED ORDER — BASAGLAR KWIKPEN 100 UNIT/ML ~~LOC~~ SOPN: 65.0000 [IU] | PEN_INJECTOR | Freq: Every day | SUBCUTANEOUS | 2 refills | Status: DC

## 2020-06-05 NOTE — Patient Instructions (Signed)

## 2020-06-05 NOTE — Progress Notes (Signed)
06/05/2020, 4:15 PM   Endocrinology follow-up note   Subjective:    Patient ID: Dana Dominguez, female    DOB: [birth year].  Dana Dominguez is being seen in follow-up for management of currently uncontrolled type 2 diabetes, hyperlipidemia, hypertension. PCP:   Virgina Norfolk, MD.   Past Medical History:  Diagnosis Date  . Back pain   . Diabetes (HCC)   . Dysphagia 09/29/2016  . GERD (gastroesophageal reflux disease)   . Heart murmur   . High cholesterol   . On supplemental oxygen therapy    3L [place]  at night   Past Surgical History:  Procedure Laterality Date  . BACK SURGERY     x 2 in the past  . carotid surgery     December 2015 rt   . CHOLECYSTECTOMY     [facility]  . COLON SURGERY     for diverticulitis  . ESOPHAGEAL DILATION N/A 11/21/2014   Procedure: ESOPHAGEAL DILATION;  Surgeon: Malissa Hippo, MD;  Location: [facility];  Service: Endoscopy;  Laterality: N/A;  . ESOPHAGOGASTRODUODENOSCOPY N/A 11/21/2014   Procedure: ESOPHAGOGASTRODUODENOSCOPY (EGD);  Surgeon: Malissa Hippo, MD;  Location: [facility];  Service: Endoscopy;  Laterality: N/A;  1200 - moved to 4/7 @ 9:00  . NASAL SINUS SURGERY    . NECK SURGERY    . stent in left arm     blockage 3 yrs ago.    Social History   Socioeconomic History  . Marital status: Married    Spouse name: Not on file  . Number of children: Not on file  . Years of education: Not on file  . Highest education level: Not on file  Occupational History  . Not on file  Tobacco Use  . Smoking status: Current Every Day Smoker    Packs/day: 2.00    Years: 50.00    Pack years: 100.00  . Smokeless tobacco: Never Used  . Tobacco comment: 1 pack day since 12 yrs  Vaping Use  . Vaping Use: Never used  Substance and Sexual Activity  . Alcohol use: No    Alcohol/week: 0.0 standard drinks  . Drug use: No  . Sexual activity: Not on file  Other Topics Concern  . Not on file  Social History Narrative  . Not  on file   Social Determinants of Health   Financial Resource Strain:   . Difficulty of Paying Living Expenses: Not on file  Food Insecurity:   . Worried About Programme researcher, broadcasting/film/video in the Last Year: Not on file  . Ran Out of Food in the Last Year: Not on file  Transportation Needs:   . Lack of Transportation (Medical): Not on file  . Lack of Transportation (Non-Medical): Not on file  Physical Activity:   . Days of Exercise per Week: Not on file  . Minutes of Exercise per Session: Not on file  Stress:   . Feeling of Stress : Not on file  Social Connections:   . Frequency of Communication with Friends and Family: Not on file  . Frequency of Social Gatherings with Friends and Family: Not on file  . Attends Religious Services: Not on file  . Active Member of Clubs or Organizations: Not on file  . Attends Banker Meetings: Not on file  . Marital Status: Not on file   Outpatient Encounter Medications as of 06/05/2020  Medication Sig  . ARIPiprazole (ABILIFY) 5 MG tablet  Take 5 mg by mouth daily.  Marland Kitchen aspirin EC 81 MG tablet Take 81 mg by mouth daily.  Marland Kitchen atorvastatin (LIPITOR) 80 MG tablet Take 80 mg by mouth daily.   . busPIRone (BUSPAR) 15 MG tablet Take 15 mg by mouth 3 (three) times daily.  . carvedilol (COREG) 3.125 MG tablet Take 3.125 mg by mouth daily.  . clopidogrel (PLAVIX) 75 MG tablet Take by mouth.  . Continuous Blood Gluc Sensor (FREESTYLE LIBRE 14 DAY SENSOR) MISC Inject 1 each into the skin every 14 (fourteen) days. Use as directed.  . Cyanocobalamin (B-12) 2500 MCG SUBL Take 1 tablet by mouth daily.  Marland Kitchen desloratadine (CLARINEX) 5 MG tablet Take 5 mg by mouth daily.  Marland Kitchen dexlansoprazole (DEXILANT) 60 MG capsule Take 60 mg by mouth daily.  Marland Kitchen dicyclomine (BENTYL) 10 MG capsule Take 10 mg by mouth 2 (two) times daily.  Dana Sanfilippo Sodium (DSS) 100 MG CAPS Take by mouth.  . DULoxetine (CYMBALTA) 60 MG capsule Take 60 mg by mouth 2 (two) times daily.  Marland Kitchen glipiZIDE  (GLUCOTROL XL) 5 MG 24 hr tablet TAKE 1 TABLET BY MOUTH EVERY DAY WITH BREAKFAST  . glipiZIDE (GLUCOTROL) 5 MG tablet TAKE 1 TABLET (5 MG TOTAL) BY MOUTH DAILY BEFORE BREAKFAST.  . hydrOXYzine (ATARAX/VISTARIL) 50 MG tablet Take 50 mg by mouth 2 (two) times daily.  . hydrOXYzine (VISTARIL) 50 MG capsule Take 50 mg by mouth 2 (two) times daily as needed.  . Insulin Glargine (BASAGLAR KWIKPEN) 100 UNIT/ML INJECT 80 UNITS SUBCUTANEOUSLY AT BEDTIME  . metFORMIN (GLUCOPHAGE-XR) 500 MG 24 hr tablet TAKE 2 TABLETS (1000MG  DOSE) BY MOUTH ONCE DAILY WITH BREAKFAST  . ondansetron (ZOFRAN) 4 MG tablet Take 1 tablet (4 mg total) by mouth every 6 (six) hours.  . traZODone (DESYREL) 100 MG tablet Take 200 mg by mouth at bedtime.    No facility-administered encounter medications on file as of 06/05/2020.    ALLERGIES: Allergies  Allergen Reactions  . Glipizide Nausea Only, Other (See Comments) and Rash    Cold sweats   . Codeine     Dizzy, nausea, cold sweat  . Plavix [Clopidogrel Bisulfate]     SOB  . Protonix [Pantoprazole Sodium]     ? nausea  . Sporanox [Itraconazole]     Major rash    VACCINATION STATUS:  There is no immunization history on file for this patient.  Diabetes She presents for her follow-up diabetic visit. She has type 2 diabetes mellitus. Onset time: She was diagnosed at approximate age of 45. Her disease course has been stable. There are no hypoglycemic associated symptoms. Pertinent negatives for hypoglycemia include no confusion, headaches, pallor or seizures. Associated symptoms include fatigue, foot paresthesias and weakness. Pertinent negatives for diabetes include no blurred vision, no chest pain, no polydipsia, no polyphagia and no polyuria. There are no hypoglycemic complications. Symptoms are stable. Diabetic complications include peripheral neuropathy. (Had stents placed in legs since last visit) Risk factors for coronary artery disease include diabetes mellitus,  dyslipidemia, family history, hypertension, sedentary lifestyle, tobacco exposure and post-menopausal. Current diabetic treatment includes insulin injections and oral agent (dual therapy). She is compliant with treatment most of the time. Her weight is fluctuating minimally. She is following a generally unhealthy diet. When asked about meal planning, she reported none. She has not had a previous visit with a dietitian. 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  Cannot exercise optimally.) participates in exercise. Her home blood glucose trend is decreasing  steadily. Her breakfast blood glucose range is generally 70-90 mg/dl. Her lunch blood glucose range is generally 140-180 mg/dl. Her dinner blood glucose range is generally 180-200 mg/dl. Her overall blood glucose range is 140-180 mg/dl. (She presents today, accompanied by her husband, with her CGM and logs showing continued fluctuating glycemic profile.  She has frequent fasting and postprandial hypoglycemia causing her to snack more often.  She reports a decreased appetite.  Her POCT A1C today is 7.5%, unchanged from last visit.  Her CGM analysis shows TIR 58%, TAR 37%, TBR 5%.) An ACE inhibitor/angiotensin II receptor blocker is not being taken. She does not see a podiatrist.Eye exam is current.  Hyperlipidemia This is a chronic problem. The current episode started more than 1 year ago. The problem is controlled. Recent lipid tests were reviewed and are variable. Exacerbating diseases include diabetes. Factors aggravating her hyperlipidemia include smoking, fatty foods and beta blockers. Pertinent negatives include no chest pain, myalgias or shortness of breath. Current antihyperlipidemic treatment includes statins. The current treatment provides moderate improvement of lipids. Compliance problems include adherence to diet and adherence to exercise.  Risk factors for coronary artery disease include diabetes mellitus, dyslipidemia, family  history, hypertension, a sedentary lifestyle and post-menopausal.  Hypertension This is a chronic problem. The current episode started more than 1 year ago. The problem has been gradually improving since onset. The problem is controlled. Pertinent negatives include no blurred vision, chest pain, headaches, palpitations or shortness of breath. There are no associated agents to hypertension. Risk factors for coronary artery disease include dyslipidemia, diabetes mellitus, sedentary lifestyle, smoking/tobacco exposure and family history. Past treatments include beta blockers. The current treatment provides mild improvement. There are no compliance problems.     Review of systems  Constitutional: + Minimally fluctuating body weight,  current Body mass index is 27 kg/m. , + fatigue, no subjective hyperthermia, no subjective hypothermia Eyes: no blurry vision, no xerophthalmia ENT: no sore throat, no nodules palpated in throat, no dysphagia/odynophagia, no hoarseness Cardiovascular: no chest pain, no shortness of breath, no palpitations, no leg swelling, had stents placed in BLE- reports some tenderness Respiratory: no cough, no shortness of breath Gastrointestinal: no nausea/vomiting/diarrhea, + Decreased appetite Musculoskeletal: no muscle/joint aches Skin: no rashes, no hyperemia Neurological: no tremors, no numbness, no tingling, no dizziness Psychiatric: no depression, no anxiety   Objective:    BP 101/67 (BP Location: Right Arm, Patient Position: Sitting)   Pulse 93   Wt 152 lb 6.4 oz (69.1 kg)   BMI 27.00 kg/m   Wt Readings from Last 3 Encounters:  06/05/20 152 lb 6.4 oz (69.1 kg)  02/05/20 157 lb 3.2 oz (71.3 kg)  01/12/20 160 lb (72.6 kg)    BP Readings from Last 3 Encounters:  06/05/20 101/67  02/05/20 111/71  01/12/20 (!) 120/56     Physical Exam- Limited  Constitutional:  Body mass index is 27 kg/m. , not in acute distress, normal state of mind Eyes:  EOMI, no  exophthalmos Neck: Supple Thyroid: No gross goiter Cardiovascular: RRR, no murmers, rubs, or gallops, no edema Respiratory: Adequate breathing efforts, no crackles, rales, rhonchi, or wheezing Musculoskeletal: no gross deformities, strength intact in all four extremities, no gross restriction of joint movements Skin:  no rashes, no hyperemia Neurological: no tremor with outstretched hands     CMP     Component Value Date/Time   NA 131 (L) 01/12/2020 1216   NA 131 (A) 04/11/2019 0000   K 3.9 01/12/2020 1216  CL 92 (L) 01/12/2020 1216   CO2 26 01/12/2020 1216   GLUCOSE 199 (H) 01/12/2020 1216   BUN 6 (L) 01/12/2020 1216   BUN 6 04/11/2019 0000   CREATININE 0.62 01/12/2020 1216   CALCIUM 9.3 01/12/2020 1216   PROT 8.3 (H) 01/12/2020 1216   PROT 7.5 08/22/2018 1147   ALBUMIN 3.4 (L) 01/12/2020 1216   ALBUMIN 3.7 08/22/2018 1147   AST 33 01/12/2020 1216   ALT 19 01/12/2020 1216   ALKPHOS 106 01/12/2020 1216   BILITOT 0.6 01/12/2020 1216   BILITOT 0.3 08/22/2018 1147   GFRNONAA >60 01/12/2020 1216   GFRAA >60 01/12/2020 1216     Diabetic Labs (most recent): Lab Results  Component Value Date   HGBA1C 7.5 (A) 02/05/2020   HGBA1C 8.1 (A) 10/31/2019   HGBA1C 8.8 (A) 07/02/2019     Lipid Panel ( most recent) Lipid Panel     Component Value Date/Time   CHOL 137 01/29/2020 1026   TRIG 163 (H) 01/29/2020 1026   HDL 55 01/29/2020 1026   LDLCALC 55 01/29/2020 1026      Lab Results  Component Value Date   TSH 2.65 04/11/2019   TSH 1.30 01/30/2018     Assessment & Plan:   1. Uncontrolled type 2 diabetes mellitus with hyperglycemia (HCC)  - Faduma Cho has currently uncontrolled symptomatic type 2 DM since 64 years of age.  She presents today, accompanied by her husband, with her CGM and logs showing continued fluctuating glycemic profile.  She has frequent fasting and postprandial hypoglycemia causing her to snack more often.  She reports a decreased appetite.  Her  POCT A1C today is 7.5%, unchanged from last visit.  Her CGM analysis shows TIR 58%, TAR 37%, TBR 5%.  -Her recent labs are reviewed with her.   -her diabetes is complicated by peripheral neuropathy and she remains at extremely high risk for more acute and chronic complications which include CAD, CVA, CKD, retinopathy, and neuropathy. These are all discussed in detail with her.  - Nutritional counseling repeated at each appointment due to patients tendency to fall back in to old habits.  - The patient admits there is a room for improvement in their diet and drink choices. -  Suggestion is made for the patient to avoid simple carbohydrates from their diet including Cakes, Sweet Desserts / Pastries, Ice Cream, Soda (diet and regular), Sweet Tea, Candies, Chips, Cookies, Sweet Pastries,  Store Bought Juices, Alcohol in Excess of  1-2 drinks a day, Artificial Sweeteners, Coffee Creamer, and "Sugar-free" Products. This will help patient to have stable blood glucose profile and potentially avoid unintended weight gain.   - I encouraged the patient to switch to  unprocessed or minimally processed complex starch and increased protein intake (animal or plant source), fruits, and vegetables.   - Patient is advised to stick to a routine mealtimes to eat 3 meals  a day and avoid unnecessary snacks ( to snack only to correct hypoglycemia).  - I have approached her with the following individualized plan to manage diabetes and patient agrees:   -Given her frequent fasting and postprandial glycemic profile, she will benefit from reduction in her Basaglar to 65 units SQ nightly.  She is advised to continue Metformin 1000 mg ER po daily with breakfast and Glipizide 5 mg XL daily with breakfast.  -She is encouraged to continue using her CGM to monitor glucose at least 4 times daily, before meals and at bedtime, and call the clinic  if she has readings less than 70 or greater than 300 for 3 tests in a row.  - she is  not a candidate for incretin therapy, SGLT2 inhibitors . She is chronic heavy smoker.  - Patient specific target  A1c;  LDL, HDL, Triglycerides, and  Waist Circumference were discussed in detail.  2) BP/HTN:  Her blood pressure is controlled to target.  She is advised to continue Coreg 3.125 mg po daily.  3) Lipids/HPL:  Her most recent lipid panel from 01/29/20 shows controlled LDL at 55 and elevated triglycerides of 163.  She is advised to continue Lipitor 80 mg po daily at bedtime.  Side effects and precautions discussed with her.  4)  Weight/Diet:  Her Body mass index is 27 kg/m. - clearly complicating her diabetes care.  She is a candidate for modest weight loss.  CDE Consult has been initiated.   Exercise, and detailed carbohydrates information provided  -  detailed on discharge instructions.  5) Chronic Care/Health Maintenance: -she is on Statin medications and  is encouraged to initiate and continue to follow up with Ophthalmology, Dentist, pulmonology given her oxygen requiring COPD, podiatrist at least yearly or according to recommendations, and advised to quit smoking (this patient smoked 60+ pack year starting from age 70). I have recommended yearly flu vaccine and pneumonia vaccine at least every 5 years; moderate intensity exercise for up to 150 minutes weekly; and  sleep for at least 7 hours a day.  -She is starting to show interest in quitting smoking asking about the patches and how they work.  Will follow up on subsequent visits.  - I advised patient to maintain close follow up with Virgina Norfolk, MD for primary care needs.  - Time spent on this patient care encounter:  35 min, of which > 50% was spent in  counseling and the rest reviewing her blood glucose logs , discussing her hypoglycemia and hyperglycemia episodes, reviewing her current and  previous labs / studies  ( including abstraction from other facilities) and medications  doses and developing a  long term treatment  plan and documenting her care.   Please refer to Patient Instructions for Blood Glucose Monitoring and Insulin/Medications Dosing Guide"  in media tab for additional information. Please  also refer to " Patient Self Inventory" in the Media  tab for reviewed elements of pertinent patient history.  [name] participated in the discussions, expressed understanding, and voiced agreement with the above plans.  All questions were answered to her satisfaction. she is encouraged to contact clinic should she have any questions or concerns prior to her return visit.   Follow up plan: - Return in about 4 months (around 10/06/2020) for Diabetes follow up- A1c and urine micro in office, Previsit labs, Bring glucometer and logs.  Ronny Bacon, [facility] [address] [place], [place] [address] Phone: [phone] Fax: [phone]   06/05/2020, 4:15 PM

## 2020-06-13 ENCOUNTER — Telehealth: Payer: Self-pay | Admitting: "Endocrinology

## 2020-06-16 ENCOUNTER — Other Ambulatory Visit: Payer: Self-pay

## 2020-06-16 DIAGNOSIS — E1165 Type 2 diabetes mellitus with hyperglycemia: Secondary | ICD-10-CM

## 2020-06-16 MED ORDER — "BD INSULIN SYRINGE U/F 31G X 5/16"" 0.3 ML MISC": 1.0000 | Freq: Every day | 5 refills | Status: AC

## 2020-06-16 MED ORDER — FREESTYLE LIBRE 14 DAY SENSOR MISC: 1.0000 | 2 refills | Status: DC

## 2020-06-16 NOTE — Telephone Encounter (Signed)
Pt is calling about this being sent in. [employer]

## 2020-06-16 NOTE — Telephone Encounter (Signed)
resent

## 2020-06-17 ENCOUNTER — Other Ambulatory Visit: Payer: Self-pay

## 2020-06-17 DIAGNOSIS — E1165 Type 2 diabetes mellitus with hyperglycemia: Secondary | ICD-10-CM

## 2020-06-17 MED ORDER — BD PEN NEEDLE SHORT U/F 31G X 8 MM MISC: 1 refills | Status: AC

## 2020-06-20 ENCOUNTER — Other Ambulatory Visit: Payer: Self-pay | Admitting: "Endocrinology

## 2020-07-24 ENCOUNTER — Other Ambulatory Visit: Payer: Self-pay | Admitting: "Endocrinology

## 2020-08-11 ENCOUNTER — Other Ambulatory Visit: Payer: Self-pay | Admitting: "Endocrinology

## 2020-08-12 ENCOUNTER — Emergency Department (HOSPITAL_COMMUNITY)
Admission: EM | Admit: 2020-08-12 | Discharge: 2020-08-12 | Disposition: A | Discharge status: Home / Self Care | Payer: Federal, State, Local not specified - PPO | Attending: Emergency Medicine | Admitting: Emergency Medicine

## 2020-08-12 ENCOUNTER — Other Ambulatory Visit: Payer: Self-pay

## 2020-08-12 DIAGNOSIS — Z5321 Procedure and treatment not carried out due to patient leaving prior to being seen by health care provider: Secondary | ICD-10-CM | POA: Insufficient documentation

## 2020-08-12 DIAGNOSIS — R2241 Localized swelling, mass and lump, right lower limb: Secondary | ICD-10-CM | POA: Insufficient documentation

## 2020-08-12 NOTE — ED Triage Notes (Addendum)
Pt to the ED for eval of lower rt leg and foot swelling x2 days.  Pt states she had stents placed in sept 2021.  Pulses intact.

## 2020-08-25 ENCOUNTER — Telehealth: Payer: Self-pay

## 2020-08-25 NOTE — Telephone Encounter (Signed)
Patient is requesting a call back from the nurse. 

## 2020-08-25 NOTE — Telephone Encounter (Signed)
Returned call to patient, no answer, left a vm for pt to call back

## 2020-08-26 NOTE — Telephone Encounter (Signed)
Patient called with the following BG readings.1/11 41 @ 0921am, 49 @ 0930am, 119 @0953am , 1/10 245 @ 854pm, 203 @419pm , 215 @436am , 204 @1210am ,1/9 183 @ 1112 pm, 217 @ 841 am, 289 @ 750pm ,1/8 84 @ 739am, 171 @ 1053am, 64 @ 1101am. Patient is requesting a vial of insulin to use when her BG is elevated. Please advise.

## 2020-08-26 NOTE — Telephone Encounter (Signed)
No, she does not need to treat her high blood glucose with fast-acting insulin.  It would put her at an increased risk for hypoglycemia (which she already has frequently based on her recent readings and previous CGM report).  This would only cause more dramatic fluctuations her her glucose level making it harder to control her diabetes.

## 2020-08-26 NOTE — Telephone Encounter (Signed)
Spoke with patient and advised, verbalized understanding 

## 2020-09-08 ENCOUNTER — Other Ambulatory Visit: Payer: Self-pay | Admitting: "Endocrinology

## 2020-09-08 DIAGNOSIS — E1165 Type 2 diabetes mellitus with hyperglycemia: Secondary | ICD-10-CM

## 2020-09-19 ENCOUNTER — Other Ambulatory Visit: Payer: Self-pay | Admitting: Nurse Practitioner

## 2020-09-19 DIAGNOSIS — E1165 Type 2 diabetes mellitus with hyperglycemia: Secondary | ICD-10-CM

## 2020-09-24 NOTE — Telephone Encounter (Signed)
Was this low a one time thing? What happened prior to needing to call EMS (did she skip a meal, take too much insulin)? Because according to her latest readings, she tends to run a bit on the high side. Did she ever get the [place]?  If she did, I would like to invite her to the clinic site so we can physically see what is going on. She can lower her insulin (Basaglar) to 55 units SQ nightly for now, until she sees me again.  We can even move her appointment up if she would prefer.

## 2020-09-24 NOTE — Telephone Encounter (Signed)
Returned call to patient and advised, verbalized understanding 

## 2020-09-24 NOTE — Telephone Encounter (Signed)
Returned call to patient , she stated that she had not been taking glipizide for 3 weeks due to getting the script filled, she is now taking it, she also lowered herself to 50 units after she was low on Saturday night, her e mail is [email or web address], please advise

## 2020-09-24 NOTE — Telephone Encounter (Signed)
Readings: she states she has not been writing these readings down and has a hard time seeing the meter when she goes back to find the readings. Advised patient to please record the readings on paper. Her readings are all over the place.   2/6 8:39 am 296, 11:21 am 270, 12:25pm 236,  2/7 1:44 am 192, 3:28 am 168, 7:01 am 259 2/8 11:00 am 152, 3:12 pm 86, can not see anymore readings on this day 2/9 3:53 am 83, 4 am 81  She said that her sugar was 33 on Saturday and she had to call EMS. Please Advise.

## 2020-09-24 NOTE — Telephone Encounter (Signed)
I sent her an invite to link up with me.  Tell her to stay on the 50 units for now and continue taking the Glipizide (although we may need to decrease dose at next visit depending on how she responds).  No need to lower it further at this time, I dont want her sugars to rebound and go sky high.  If her glucose drops below 70 more than 3 times in a week, have her call me back.  Enforce the need to eat on a routine schedule and not skip meals as well.

## 2020-10-08 LAB — COMPREHENSIVE METABOLIC PANEL
ALT: 12 IU/L (ref 0–32)
AST: 32 IU/L (ref 0–40)
Albumin/Globulin Ratio: 0.9 — ABNORMAL LOW (ref 1.2–2.2)
Albumin: 3.6 g/dL — ABNORMAL LOW (ref 3.8–4.8)
Alkaline Phosphatase: 120 IU/L (ref 44–121)
BUN/Creatinine Ratio: 9 — ABNORMAL LOW (ref 12–28)
BUN: 6 mg/dL — ABNORMAL LOW (ref 8–27)
Bilirubin Total: 0.3 mg/dL (ref 0.0–1.2)
CO2: 21 mmol/L (ref 20–29)
Calcium: 8.7 mg/dL (ref 8.7–10.3)
Chloride: 93 mmol/L — ABNORMAL LOW (ref 96–106)
Creatinine, Ser: 0.7 mg/dL (ref 0.57–1.00)
GFR calc Af Amer: 106 mL/min/{1.73_m2} (ref >59)
GFR calc non Af Amer: 92 mL/min/{1.73_m2} (ref >59)
Globulin, Total: 4 g/dL (ref 1.5–4.5)
Glucose: 306 mg/dL — ABNORMAL HIGH (ref 65–99)
Potassium: 5.5 mmol/L — ABNORMAL HIGH (ref 3.5–5.2)
Sodium: 130 mmol/L — ABNORMAL LOW (ref 134–144)
Total Protein: 7.6 g/dL (ref 6.0–8.5)

## 2020-10-09 ENCOUNTER — Encounter: Payer: Self-pay | Admitting: Nurse Practitioner

## 2020-10-09 ENCOUNTER — Other Ambulatory Visit: Payer: Self-pay

## 2020-10-09 ENCOUNTER — Ambulatory Visit (INDEPENDENT_AMBULATORY_CARE_PROVIDER_SITE_OTHER): Payer: Federal, State, Local not specified - PPO | Admitting: Nurse Practitioner

## 2020-10-09 VITALS — BP 115/65 | HR 99 | Ht 63.0 in | Wt 155.4 lb

## 2020-10-09 DIAGNOSIS — I10 Essential (primary) hypertension: Secondary | ICD-10-CM | POA: Diagnosis not present

## 2020-10-09 DIAGNOSIS — E1165 Type 2 diabetes mellitus with hyperglycemia: Secondary | ICD-10-CM | POA: Diagnosis not present

## 2020-10-09 DIAGNOSIS — F17200 Nicotine dependence, unspecified, uncomplicated: Secondary | ICD-10-CM

## 2020-10-09 DIAGNOSIS — E782 Mixed hyperlipidemia: Secondary | ICD-10-CM

## 2020-10-09 DIAGNOSIS — E559 Vitamin D deficiency, unspecified: Secondary | ICD-10-CM

## 2020-10-09 LAB — POCT GLYCOSYLATED HEMOGLOBIN (HGB A1C): HbA1c, POC (controlled diabetic range): 8.5 % — AB (ref 0.0–7.0)

## 2020-10-09 LAB — POCT UA - MICROALBUMIN: Microalbumin Ur, POC: 150 mg/L

## 2020-10-09 MED ORDER — BASAGLAR KWIKPEN 100 UNIT/ML ~~LOC~~ SOPN: 60.0000 [IU] | PEN_INJECTOR | Freq: Every day | SUBCUTANEOUS | 3 refills | Status: DC

## 2020-10-09 NOTE — Progress Notes (Signed)
10/09/2020, 1:59 PM   Endocrinology follow-up note   Subjective:    Patient ID: Dana Dominguez, female    DOB: 03/06/1956.  Dana Dominguez is being seen in follow-up for management of currently uncontrolled type 2 diabetes, hyperlipidemia, hypertension. PCP:   Joya Salm.   Past Medical History:  Diagnosis Date  . Back pain   . Diabetes (HCC)   . Dysphagia 09/29/2016  . GERD (gastroesophageal reflux disease)   . Heart murmur   . High cholesterol   . On supplemental oxygen therapy    3L [place]  at night   Past Surgical History:  Procedure Laterality Date  . BACK SURGERY     x 2 in the past  . carotid surgery     December 2015 rt   . CHOLECYSTECTOMY     [facility]  . COLON SURGERY     for diverticulitis  . ESOPHAGEAL DILATION N/A 11/21/2014   Procedure: ESOPHAGEAL DILATION;  Surgeon: Malissa Hippo, MD;  Location: [facility];  Service: Endoscopy;  Laterality: N/A;  . ESOPHAGOGASTRODUODENOSCOPY N/A 11/21/2014   Procedure: ESOPHAGOGASTRODUODENOSCOPY (EGD);  Surgeon: Malissa Hippo, MD;  Location: [facility];  Service: Endoscopy;  Laterality: N/A;  1200 - moved to 4/7 @ 9:00  . NASAL SINUS SURGERY    . NECK SURGERY    . stent in left arm     blockage 3 yrs ago.    Social History   Socioeconomic History  . Marital status: Married    Spouse name: Not on file  . Number of children: Not on file  . Years of education: Not on file  . Highest education level: Not on file  Occupational History  . Not on file  Tobacco Use  . Smoking status: Current Every Day Smoker    Packs/day: 2.00    Years: 50.00    Pack years: 100.00  . Smokeless tobacco: Never Used  . Tobacco comment: 1 pack day since 12 yrs  Vaping Use  . Vaping Use: Never used  Substance and Sexual Activity  . Alcohol use: No    Alcohol/week: 0.0 standard drinks  . Drug use: No  . Sexual activity: Not on file  Other Topics Concern  . Not on file  Social History Narrative  . Not on  file   Social Determinants of Health   Financial Resource Strain: Not on file  Food Insecurity: Not on file  Transportation Needs: Not on file  Physical Activity: Not on file  Stress: Not on file  Social Connections: Not on file   Outpatient Encounter Medications as of 10/09/2020  Medication Sig  . ARIPiprazole (ABILIFY) 5 MG tablet Take 5 mg by mouth daily.  Marland Kitchen aspirin EC 81 MG tablet Take 81 mg by mouth daily.  Marland Kitchen atorvastatin (LIPITOR) 80 MG tablet Take 80 mg by mouth daily.   . B-D ULTRAFINE III SHORT PEN 31G X 8 MM MISC SMARTSIG:1 Each SUB-Q Daily  . BD INSULIN SYRINGE U/F 31G X 5/16" 0.3 ML MISC 1 each by Other route at bedtime.  . busPIRone (BUSPAR) 15 MG tablet Take 15 mg by mouth 3 (three) times daily.  . carvedilol (COREG) 3.125 MG tablet Take 3.125 mg by mouth daily.  . clopidogrel (PLAVIX) 75 MG tablet Take by mouth.  . Continuous Blood Gluc Sensor (FREESTYLE LIBRE 14 DAY SENSOR) MISC Inject 1 each into the skin every 14 (fourteen) days. Use as directed.  . Cyanocobalamin (B-12)  2500 MCG SUBL Take 1 tablet by mouth daily.  Marland Kitchen desloratadine (CLARINEX) 5 MG tablet Take 5 mg by mouth daily.  Marland Kitchen dexlansoprazole (DEXILANT) 60 MG capsule Take 60 mg by mouth daily.  Marland Kitchen dicyclomine (BENTYL) 10 MG capsule Take 10 mg by mouth 2 (two) times daily.  Tery Sanfilippo Sodium (DSS) 100 MG CAPS Take by mouth.  . DULoxetine (CYMBALTA) 60 MG capsule Take 60 mg by mouth 2 (two) times daily.  Marland Kitchen glipiZIDE (GLUCOTROL XL) 5 MG 24 hr tablet TAKE 1 TABLET BY MOUTH EVERY DAY WITH BREAKFAST  . glipiZIDE (GLUCOTROL) 5 MG tablet TAKE 1 TABLET (5 MG TOTAL) BY MOUTH DAILY BEFORE BREAKFAST.  . hydroxychloroquine (PLAQUENIL) 200 MG tablet Take 200 mg by mouth daily.  . hydrOXYzine (ATARAX/VISTARIL) 50 MG tablet Take 50 mg by mouth 2 (two) times daily.  . hydrOXYzine (VISTARIL) 50 MG capsule Take 50 mg by mouth 2 (two) times daily as needed.  . metFORMIN (GLUCOPHAGE-XR) 500 MG 24 hr tablet TAKE 2 TABLETS (1000MG   DOSE) BY MOUTH ONCE DAILY WITH BREAKFAST  . ondansetron (ZOFRAN) 4 MG tablet Take 1 tablet (4 mg total) by mouth every 6 (six) hours.  . traZODone (DESYREL) 100 MG tablet Take 200 mg by mouth at bedtime.   . [DISCONTINUED] Insulin Glargine (BASAGLAR KWIKPEN) 100 UNIT/ML INJECT 65 UNITS INTO THE SKIN AT BEDTIME. (Patient taking differently: Inject 55 Units into the skin at bedtime.)  . Insulin Glargine (BASAGLAR KWIKPEN) 100 UNIT/ML Inject 60 Units into the skin at bedtime.   No facility-administered encounter medications on file as of 10/09/2020.    ALLERGIES: Allergies  Allergen Reactions  . Glipizide Nausea Only, Other (See Comments) and Rash    Cold sweats   . Codeine     Dizzy, nausea, cold sweat  . Plavix [Clopidogrel Bisulfate]     SOB  . Protonix [Pantoprazole Sodium]     ? nausea  . Sporanox [Itraconazole]     Major rash    VACCINATION STATUS:  There is no immunization history on file for this patient.  Diabetes She presents for her follow-up diabetic visit. She has type 2 diabetes mellitus. Onset time: She was diagnosed at approximate age of 65. Her disease course has been worsening. There are no hypoglycemic associated symptoms. Pertinent negatives for hypoglycemia include no confusion, headaches, pallor or seizures. Associated symptoms include fatigue, foot paresthesias and weakness. Pertinent negatives for diabetes include no blurred vision, no chest pain, no polydipsia, no polyphagia and no polyuria. There are no hypoglycemic complications. Symptoms are stable. Diabetic complications include nephropathy and peripheral neuropathy. (Had stents placed in legs since last visit) Risk factors for coronary artery disease include diabetes mellitus, dyslipidemia, family history, hypertension, sedentary lifestyle, tobacco exposure and post-menopausal. Current diabetic treatment includes insulin injections and oral agent (dual therapy). She is compliant with treatment most of the time.  Her weight is fluctuating minimally. She is following a generally unhealthy diet. When asked about meal planning, she reported none. She has not had a previous visit with a dietitian. 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  Cannot exercise optimally.) participates in exercise. Her home blood glucose trend is increasing steadily. Her breakfast blood glucose range is generally >200 mg/dl. Her lunch blood glucose range is generally >200 mg/dl. Her dinner blood glucose range is generally >200 mg/dl. Her bedtime blood glucose range is generally >200 mg/dl. Her overall blood glucose range is >200 mg/dl. (She presents today, accompanied by her husband, with her CGM and  logs showing worsening glycemic profile overall.  Her POCT A1c today is 8.5%, increasing from previous visit of 7.5%.  She did have some hypoglycemia since last visit in which she called the office for advice.  She had run out of Glipizide for a short period of time and when she restarted it, that is when she had low glucose readings.  Since adjusting her medications, the hypoglycemia has improved.  Analysis of her CGM shows TIR 22%, TAR 88%, TBR 0%.) An ACE inhibitor/angiotensin II receptor blocker is not being taken. She does not see a podiatrist.Eye exam is current.  Hyperlipidemia This is a chronic problem. The current episode started more than 1 year ago. The problem is controlled. Recent lipid tests were reviewed and are variable. Exacerbating diseases include chronic renal disease and diabetes. Factors aggravating her hyperlipidemia include smoking, fatty foods and beta blockers. Pertinent negatives include no chest pain, myalgias or shortness of breath. Current antihyperlipidemic treatment includes statins. The current treatment provides moderate improvement of lipids. Compliance problems include adherence to diet and adherence to exercise.  Risk factors for coronary artery disease include diabetes mellitus, dyslipidemia, family  history, hypertension, a sedentary lifestyle and post-menopausal.  Hypertension This is a chronic problem. The current episode started more than 1 year ago. The problem has been gradually improving since onset. The problem is controlled. Pertinent negatives include no blurred vision, chest pain, headaches, palpitations or shortness of breath. There are no associated agents to hypertension. Risk factors for coronary artery disease include dyslipidemia, diabetes mellitus, sedentary lifestyle, smoking/tobacco exposure and family history. Past treatments include beta blockers. The current treatment provides moderate improvement. There are no compliance problems.  Hypertensive end-organ damage includes kidney disease. Identifiable causes of hypertension include chronic renal disease.    Review of systems  Constitutional: + Minimally fluctuating body weight,  current Body mass index is 27.53 kg/m. , + fatigue, no subjective hyperthermia, no subjective hypothermia Eyes: no blurry vision, no xerophthalmia ENT: no sore throat, no nodules palpated in throat, no dysphagia/odynophagia, no hoarseness Cardiovascular: no chest pain, no shortness of breath, + palpitations (wearing external heart monitor), no leg swelling,  Respiratory: no cough, no shortness of breath Gastrointestinal: no nausea/vomiting/diarrhea, + Decreased appetite Musculoskeletal: no muscle/joint aches Skin: no rashes, no hyperemia Neurological: no tremors, no numbness, no tingling, no dizziness Psychiatric: no depression, no anxiety   Objective:    BP 115/65 (BP Location: Left Arm)   Pulse 99   Ht  (1.6 m)   Wt 155 lb 6.4 oz (70.5 kg)   BMI 27.53 kg/m   Wt Readings from Last 3 Encounters:  10/09/20 155 lb 6.4 oz (70.5 kg)  08/12/20 150 lb (68 kg)  06/05/20 152 lb 6.4 oz (69.1 kg)    BP Readings from Last 3 Encounters:  10/09/20 115/65  08/12/20 115/88  06/05/20 101/67     Physical Exam- Limited  Constitutional:   Body mass index is 27.53 kg/m. , not in acute distress, normal state of mind Eyes:  EOMI, no exophthalmos Neck: Supple Cardiovascular: RRR, no murmers, rubs, or gallops, no edema Respiratory: Adequate breathing efforts, no crackles, rales, rhonchi, or wheezing Musculoskeletal: no gross deformities, strength intact in all four extremities, no gross restriction of joint movements Skin:  no rashes, no hyperemia Neurological: no tremor with outstretched hands   POCT ABI Results 10/09/20   Right ABI:  1.29      Left ABI:  1.31  Right leg systolic / diastolic: 148/65 mmHg Left leg systolic /  diastolic: 151/57 mmHg  Arm systolic / diastolic: 115/65 mmHG  Detailed report will be scanned into patient chart.    Foot exam:   No rashes, ulcers, cuts, + calluses, + onychodystrophy.   Good pulses bilat.  Good sensation to 10 g monofilament bilat.  Cap refill to Left foot >3 sec      CMP     Component Value Date/Time   NA 130 (L) 10/07/2020 1330   K 5.5 (H) 10/07/2020 1330   CL 93 (L) 10/07/2020 1330   CO2 21 10/07/2020 1330   GLUCOSE 306 (H) 10/07/2020 1330   GLUCOSE 199 (H) 01/12/2020 1216   BUN 6 (L) 10/07/2020 1330   CREATININE 0.70 10/07/2020 1330   CALCIUM 8.7 10/07/2020 1330   PROT 7.6 10/07/2020 1330   ALBUMIN 3.6 (L) 10/07/2020 1330   AST 32 10/07/2020 1330   ALT 12 10/07/2020 1330   ALKPHOS 120 10/07/2020 1330   BILITOT 0.3 10/07/2020 1330   GFRNONAA 92 10/07/2020 1330   GFRAA 106 10/07/2020 1330     Diabetic Labs (most recent): Lab Results  Component Value Date   HGBA1C 8.5 (A) 10/09/2020   HGBA1C 7.5 (A) 06/05/2020   HGBA1C 7.5 (A) 02/05/2020     Lipid Panel ( most recent) Lipid Panel     Component Value Date/Time   CHOL 137 01/29/2020 1026   TRIG 163 (H) 01/29/2020 1026   HDL 55 01/29/2020 1026   LDLCALC 55 01/29/2020 1026      Lab Results  Component Value Date   TSH 2.65 04/11/2019   TSH 1.30 01/30/2018     Assessment & Plan:   1)  Uncontrolled type 2 diabetes mellitus with hyperglycemia (HCC)  - Dana GauzeJanet Dominguez has currently uncontrolled symptomatic type 2 DM since 65 years of age.  She presents today, accompanied by her husband, with her CGM and logs showing worsening glycemic profile overall.  Her POCT A1c today is 8.5%, increasing from previous visit of 7.5%.  She did have some hypoglycemia since last visit in which she called the office for advice.  She had run out of Glipizide for a short period of time and when she restarted it, that is when she had low glucose readings.  Since adjusting her medications, the hypoglycemia has improved.  Analysis of her CGM shows TIR 22%, TAR 88%, TBR 0%.  -Her recent labs are reviewed with her.  Today's POCT UM shows moderate microalbuminuria at 150.  Will monitor kidney function prior to next visit to monitor.   -her diabetes is complicated by peripheral neuropathy and she remains at extremely high risk for more acute and chronic complications which include CAD, CVA, CKD, retinopathy, and neuropathy. These are all discussed in detail with her.  - Nutritional counseling repeated at each appointment due to patients tendency to fall back in to old habits.  - The patient admits there is a room for improvement in their diet and drink choices. -  Suggestion is made for the patient to avoid simple carbohydrates from their diet including Cakes, Sweet Desserts / Pastries, Ice Cream, Soda (diet and regular), Sweet Tea, Candies, Chips, Cookies, Sweet Pastries,  Store Bought Juices, Alcohol in Excess of  1-2 drinks a day, Artificial Sweeteners, Coffee Creamer, and "Sugar-free" Products. This will help patient to have stable blood glucose profile and potentially avoid unintended weight gain.   - I encouraged the patient to switch to  unprocessed or minimally processed complex starch and increased protein intake (animal or plant source),  fruits, and vegetables.   - Patient is advised to stick to a routine  mealtimes to eat 3 meals  a day and avoid unnecessary snacks ( to snack only to correct hypoglycemia).  - I have approached her with the following individualized plan to manage diabetes and patient agrees:   -Given her worsening glycemic profile, she will benefit from an increase in her Basaglar to 60 units SQ nightly.  She is advised to continue Metformin 1000 mg ER po daily with breakfast and Glipizide 5 mg XL daily with breakfast.  -She is encouraged to continue using her CGM to monitor glucose at least 4 times daily, before meals and at bedtime, and call the clinic if she has readings less than 70 or greater than 300 for 3 tests in a row.  - she is not a candidate for incretin therapy, SGLT2 inhibitors due to chronic heavy smoking (increasing her risk of pancreatitis).  - Patient specific target  A1c;  LDL, HDL, Triglycerides, and  Waist Circumference were discussed in detail.  2) BP/HTN:  Her blood pressure is controlled to target.  She is advised to continue Coreg 3.125 mg po daily.  3) Lipids/HPL:  Her most recent lipid panel from 01/29/20 shows controlled LDL at 55 and elevated triglycerides of 163. She is advised to continue Lipitor 80 mg po daily at bedtime.  Side effects and precautions discussed with her.  Will recheck lipid panel prior to next visit.  4)  Weight/Diet:  Her Body mass index is 27.53 kg/m. - clearly complicating her diabetes care.  She is a candidate for modest weight loss.  CDE Consult has been initiated.   Exercise, and detailed carbohydrates information provided  -  detailed on discharge instructions.  5) Chronic Care/Health Maintenance: -she is on Statin medications and is encouraged to initiate and continue to follow up with Ophthalmology, Dentist, pulmonology given her oxygen requiring COPD, podiatrist at least yearly or according to recommendations, and advised to quit smoking (this patient smoked 60+ pack year starting from age 70). I have recommended yearly  flu vaccine and pneumonia vaccine at least every 5 years; moderate intensity exercise for up to 150 minutes weekly; and  sleep for at least 7 hours a day.  -She is starting to show interest in quitting smoking asking about the patches and how they work.  She has the patches waiting on her to pick up at the pharmacy.  Her husband is also encouraging her to quit.  Will follow up on subsequent visits.  - I advised patient to maintain close follow up with Joya Salm for primary care needs.  - Time spent on this patient care encounter:  40 min, of which > 50% was spent in  counseling and the rest reviewing her blood glucose logs , discussing her hypoglycemia and hyperglycemia episodes, reviewing her current and  previous labs / studies  ( including abstraction from other facilities) and medications  doses and developing a  long term treatment plan and documenting her care.   Please refer to Patient Instructions for Blood Glucose Monitoring and Insulin/Medications Dosing Guide"  in media tab for additional information. Please  also refer to " Patient Self Inventory" in the Media  tab for reviewed elements of pertinent patient history.  Dana Dominguez participated in the discussions, expressed understanding, and voiced agreement with the above plans.  All questions were answered to her satisfaction. she is encouraged to contact clinic should she have any questions or concerns prior to her  return visit.   Follow up plan: - Return in about 3 months (around 01/06/2021) for Diabetes follow up with A1c in office, Previsit labs, Bring glucometer and logs.  Ronny Bacon, [facility] [address] [place], [place] [address] Phone: [phone] Fax: [phone]  10/09/2020, 1:59 PM

## 2020-10-09 NOTE — Patient Instructions (Signed)

## 2020-12-02 ENCOUNTER — Other Ambulatory Visit: Payer: Self-pay | Admitting: "Endocrinology

## 2020-12-02 DIAGNOSIS — E1165 Type 2 diabetes mellitus with hyperglycemia: Secondary | ICD-10-CM

## 2020-12-26 ENCOUNTER — Other Ambulatory Visit: Payer: Self-pay | Admitting: "Endocrinology

## 2020-12-26 DIAGNOSIS — E1165 Type 2 diabetes mellitus with hyperglycemia: Secondary | ICD-10-CM

## 2021-01-06 ENCOUNTER — Ambulatory Visit: Payer: Federal, State, Local not specified - PPO | Admitting: Nurse Practitioner

## 2021-01-09 ENCOUNTER — Other Ambulatory Visit: Payer: Self-pay | Admitting: Nurse Practitioner

## 2021-01-15 ENCOUNTER — Other Ambulatory Visit: Payer: Self-pay | Admitting: "Endocrinology

## 2021-01-21 ENCOUNTER — Encounter (HOSPITAL_COMMUNITY): Payer: Self-pay

## 2021-01-21 ENCOUNTER — Emergency Department (HOSPITAL_COMMUNITY): Payer: Medicare Other

## 2021-01-21 ENCOUNTER — Other Ambulatory Visit: Payer: Self-pay

## 2021-01-21 ENCOUNTER — Observation Stay (HOSPITAL_COMMUNITY)
Admission: EM | Admit: 2021-01-21 | Discharge: 2021-01-22 | Disposition: A | Discharge status: Home / Self Care | Payer: Medicare Other | Attending: Emergency Medicine | Admitting: Emergency Medicine

## 2021-01-21 DIAGNOSIS — J9601 Acute respiratory failure with hypoxia: Principal | ICD-10-CM | POA: Diagnosis present

## 2021-01-21 DIAGNOSIS — Z7984 Long term (current) use of oral hypoglycemic drugs: Secondary | ICD-10-CM | POA: Diagnosis not present

## 2021-01-21 DIAGNOSIS — E782 Mixed hyperlipidemia: Secondary | ICD-10-CM | POA: Diagnosis present

## 2021-01-21 DIAGNOSIS — F17210 Nicotine dependence, cigarettes, uncomplicated: Secondary | ICD-10-CM | POA: Insufficient documentation

## 2021-01-21 DIAGNOSIS — Z7902 Long term (current) use of antithrombotics/antiplatelets: Secondary | ICD-10-CM | POA: Diagnosis not present

## 2021-01-21 DIAGNOSIS — Z7982 Long term (current) use of aspirin: Secondary | ICD-10-CM | POA: Insufficient documentation

## 2021-01-21 DIAGNOSIS — Z794 Long term (current) use of insulin: Secondary | ICD-10-CM | POA: Insufficient documentation

## 2021-01-21 DIAGNOSIS — E785 Hyperlipidemia, unspecified: Secondary | ICD-10-CM | POA: Diagnosis present

## 2021-01-21 DIAGNOSIS — J441 Chronic obstructive pulmonary disease with (acute) exacerbation: Secondary | ICD-10-CM | POA: Diagnosis present

## 2021-01-21 DIAGNOSIS — J449 Chronic obstructive pulmonary disease, unspecified: Secondary | ICD-10-CM | POA: Diagnosis not present

## 2021-01-21 DIAGNOSIS — D500 Iron deficiency anemia secondary to blood loss (chronic): Secondary | ICD-10-CM | POA: Diagnosis not present

## 2021-01-21 DIAGNOSIS — Z20822 Contact with and (suspected) exposure to covid-19: Secondary | ICD-10-CM | POA: Diagnosis not present

## 2021-01-21 DIAGNOSIS — K219 Gastro-esophageal reflux disease without esophagitis: Secondary | ICD-10-CM | POA: Diagnosis present

## 2021-01-21 DIAGNOSIS — F17200 Nicotine dependence, unspecified, uncomplicated: Secondary | ICD-10-CM | POA: Diagnosis present

## 2021-01-21 DIAGNOSIS — E1165 Type 2 diabetes mellitus with hyperglycemia: Secondary | ICD-10-CM

## 2021-01-21 DIAGNOSIS — Z79899 Other long term (current) drug therapy: Secondary | ICD-10-CM | POA: Diagnosis not present

## 2021-01-21 DIAGNOSIS — D649 Anemia, unspecified: Secondary | ICD-10-CM | POA: Diagnosis present

## 2021-01-21 DIAGNOSIS — I10 Essential (primary) hypertension: Secondary | ICD-10-CM | POA: Diagnosis present

## 2021-01-21 DIAGNOSIS — R531 Weakness: Secondary | ICD-10-CM | POA: Diagnosis present

## 2021-01-21 DIAGNOSIS — E119 Type 2 diabetes mellitus without complications: Secondary | ICD-10-CM | POA: Diagnosis not present

## 2021-01-21 HISTORY — DX: Chronic obstructive pulmonary disease, unspecified: J44.9

## 2021-01-21 LAB — CBC WITH DIFFERENTIAL/PLATELET
Abs Immature Granulocytes: 0.04 10*3/uL (ref 0.00–0.07)
Basophils Absolute: 0.1 10*3/uL (ref 0.0–0.1)
Basophils Relative: 2 %
Eosinophils Absolute: 0.2 10*3/uL (ref 0.0–0.5)
Eosinophils Relative: 3 %
HCT: 32.1 % — ABNORMAL LOW (ref 36.0–46.0)
Hemoglobin: 8.2 g/dL — ABNORMAL LOW (ref 12.0–15.0)
Immature Granulocytes: 1 %
Lymphocytes Relative: 19 %
Lymphs Abs: 1.5 10*3/uL (ref 0.7–4.0)
MCH: 16.1 pg — ABNORMAL LOW (ref 26.0–34.0)
MCHC: 25.5 g/dL — ABNORMAL LOW (ref 30.0–36.0)
MCV: 62.9 fL — ABNORMAL LOW (ref 80.0–100.0)
Monocytes Absolute: 0.6 10*3/uL (ref 0.1–1.0)
Monocytes Relative: 7 %
Neutro Abs: 5.4 10*3/uL (ref 1.7–7.7)
Neutrophils Relative %: 68 %
Platelets: 322 10*3/uL (ref 150–400)
RBC: 5.1 MIL/uL (ref 3.87–5.11)
RDW: 23.7 % — ABNORMAL HIGH (ref 11.5–15.5)
WBC: 7.8 10*3/uL (ref 4.0–10.5)
nRBC: 0.3 % — ABNORMAL HIGH (ref 0.0–0.2)

## 2021-01-21 LAB — IRON AND TIBC
Iron: 9 ug/dL — ABNORMAL LOW (ref 28–170)
Saturation Ratios: 2 % — ABNORMAL LOW (ref 10.4–31.8)
TIBC: 440 ug/dL (ref 250–450)
UIBC: 431 ug/dL

## 2021-01-21 LAB — RETICULOCYTES
Immature Retic Fract: 30.7 % — ABNORMAL HIGH (ref 2.3–15.9)
RBC.: 5.04 MIL/uL (ref 3.87–5.11)
Retic Count, Absolute: 104.8 10*3/uL (ref 19.0–186.0)
Retic Ct Pct: 2.1 % (ref 0.4–3.1)

## 2021-01-21 LAB — POC OCCULT BLOOD, ED: Fecal Occult Bld: NEGATIVE

## 2021-01-21 LAB — COMPREHENSIVE METABOLIC PANEL
ALT: 10 U/L (ref 0–44)
AST: 19 U/L (ref 15–41)
Albumin: 2.6 g/dL — ABNORMAL LOW (ref 3.5–5.0)
Alkaline Phosphatase: 101 U/L (ref 38–126)
Anion gap: 6 (ref 5–15)
BUN: 6 mg/dL — ABNORMAL LOW (ref 8–23)
CO2: 27 mmol/L (ref 22–32)
Calcium: 8.7 mg/dL — ABNORMAL LOW (ref 8.9–10.3)
Chloride: 93 mmol/L — ABNORMAL LOW (ref 98–111)
Creatinine, Ser: 0.5 mg/dL (ref 0.44–1.00)
GFR, Estimated: >60 mL/min (ref >60)
Glucose, Bld: 263 mg/dL — ABNORMAL HIGH (ref 70–99)
Potassium: 4 mmol/L (ref 3.5–5.1)
Sodium: 126 mmol/L — ABNORMAL LOW (ref 135–145)
Total Bilirubin: 0.5 mg/dL (ref 0.3–1.2)
Total Protein: 7.5 g/dL (ref 6.5–8.1)

## 2021-01-21 LAB — PROTIME-INR
INR: 1 (ref 0.8–1.2)
Prothrombin Time: 13.1 seconds (ref 11.4–15.2)

## 2021-01-21 LAB — PREPARE RBC (CROSSMATCH)

## 2021-01-21 LAB — FOLATE: Folate: 5.1 ng/mL — ABNORMAL LOW (ref >5.9)

## 2021-01-21 LAB — APTT: aPTT: 31 seconds (ref 24–36)

## 2021-01-21 LAB — FERRITIN: Ferritin: 4 ng/mL — ABNORMAL LOW (ref 11–307)

## 2021-01-21 LAB — ABO/RH: ABO/RH(D): O POS

## 2021-01-21 LAB — VITAMIN B12: Vitamin B-12: 575 pg/mL (ref 180–914)

## 2021-01-21 LAB — GLUCOSE, CAPILLARY: Glucose-Capillary: 165 mg/dL — ABNORMAL HIGH (ref 70–99)

## 2021-01-21 MED ORDER — CLOPIDOGREL BISULFATE 75 MG PO TABS
75.0000 mg | ORAL_TABLET | Freq: Every day | ORAL | Status: DC
  Administered 2021-01-22: 75 mg via ORAL
  Filled 2021-01-21: qty 1

## 2021-01-21 MED ORDER — HYDROXYCHLOROQUINE SULFATE 200 MG PO TABS
200.0000 mg | ORAL_TABLET | Freq: Every day | ORAL | Status: DC
  Administered 2021-01-22: 200 mg via ORAL
  Filled 2021-01-21: qty 1

## 2021-01-21 MED ORDER — HYDROXYZINE HCL 25 MG PO TABS
50.0000 mg | ORAL_TABLET | Freq: Two times a day (BID) | ORAL | Status: DC
  Administered 2021-01-22 (×2): 50 mg via ORAL
  Filled 2021-01-21 (×2): qty 2

## 2021-01-21 MED ORDER — SODIUM CHLORIDE 0.9 % IV BOLUS
500.0000 mL | Freq: Once | INTRAVENOUS | Status: AC
  Administered 2021-01-21: 500 mL via INTRAVENOUS

## 2021-01-21 MED ORDER — ASPIRIN EC 81 MG PO TBEC
81.0000 mg | DELAYED_RELEASE_TABLET | Freq: Every day | ORAL | Status: DC
  Administered 2021-01-22: 81 mg via ORAL
  Filled 2021-01-21: qty 1

## 2021-01-21 MED ORDER — GLIPIZIDE ER 5 MG PO TB24: 5.0000 mg | ORAL_TABLET | Freq: Every day | ORAL | Status: DC

## 2021-01-21 MED ORDER — PANTOPRAZOLE SODIUM 40 MG PO TBEC
40.0000 mg | DELAYED_RELEASE_TABLET | Freq: Every day | ORAL | Status: DC
  Administered 2021-01-22: 40 mg via ORAL
  Filled 2021-01-21: qty 1

## 2021-01-21 MED ORDER — HYDROXYZINE PAMOATE 50 MG PO CAPS: 50.0000 mg | ORAL_CAPSULE | Freq: Two times a day (BID) | ORAL | Status: DC | PRN

## 2021-01-21 MED ORDER — ARIPIPRAZOLE 5 MG PO TABS
5.0000 mg | ORAL_TABLET | Freq: Every day | ORAL | Status: DC
  Administered 2021-01-22: 5 mg via ORAL
  Filled 2021-01-21: qty 1

## 2021-01-21 MED ORDER — ACETAMINOPHEN 325 MG PO TABS
650.0000 mg | ORAL_TABLET | Freq: Four times a day (QID) | ORAL | Status: DC | PRN
  Administered 2021-01-22: 650 mg via ORAL
  Filled 2021-01-21: qty 2

## 2021-01-21 MED ORDER — INSULIN ASPART 100 UNIT/ML IJ SOLN
0.0000 [IU] | Freq: Three times a day (TID) | INTRAMUSCULAR | Status: DC
  Administered 2021-01-22 (×2): 2 [IU] via SUBCUTANEOUS

## 2021-01-21 MED ORDER — BUSPIRONE HCL 5 MG PO TABS
15.0000 mg | ORAL_TABLET | Freq: Three times a day (TID) | ORAL | Status: DC
  Administered 2021-01-22 (×2): 15 mg via ORAL
  Filled 2021-01-21 (×2): qty 3

## 2021-01-21 MED ORDER — DULOXETINE HCL 60 MG PO CPEP
60.0000 mg | ORAL_CAPSULE | Freq: Two times a day (BID) | ORAL | Status: DC
  Administered 2021-01-22 (×2): 60 mg via ORAL
  Filled 2021-01-21 (×2): qty 1

## 2021-01-21 MED ORDER — INSULIN GLARGINE 100 UNIT/ML ~~LOC~~ SOLN
45.0000 [IU] | Freq: Every day | SUBCUTANEOUS | Status: DC
  Filled 2021-01-21 (×2): qty 0.45

## 2021-01-21 MED ORDER — NEOMYCIN-POLYMYXIN-DEXAMETH 3.5-10000-0.1 OP SUSP
1.0000 [drp] | Freq: Four times a day (QID) | OPHTHALMIC | Status: DC
  Administered 2021-01-22 (×2): 1 [drp] via OPHTHALMIC
  Filled 2021-01-21: qty 5

## 2021-01-21 MED ORDER — SODIUM CHLORIDE 0.9% IV SOLUTION: Freq: Once | INTRAVENOUS | Status: DC

## 2021-01-21 MED ORDER — UMECLIDINIUM-VILANTEROL 62.5-25 MCG/INH IN AEPB
1.0000 | INHALATION_SPRAY | Freq: Every day | RESPIRATORY_TRACT | Status: DC
  Administered 2021-01-22: 1 via RESPIRATORY_TRACT
  Filled 2021-01-21: qty 14

## 2021-01-21 MED ORDER — ATORVASTATIN CALCIUM 40 MG PO TABS
80.0000 mg | ORAL_TABLET | Freq: Every day | ORAL | Status: DC
  Administered 2021-01-22: 80 mg via ORAL
  Filled 2021-01-21: qty 2

## 2021-01-21 MED ORDER — DICYCLOMINE HCL 10 MG PO CAPS
10.0000 mg | ORAL_CAPSULE | Freq: Two times a day (BID) | ORAL | Status: DC
  Administered 2021-01-22 (×2): 10 mg via ORAL
  Filled 2021-01-21 (×2): qty 1

## 2021-01-21 MED ORDER — METFORMIN HCL ER 500 MG PO TB24: 1000.0000 mg | ORAL_TABLET | Freq: Every day | ORAL | Status: DC

## 2021-01-21 MED ORDER — BASAGLAR KWIKPEN 100 UNIT/ML ~~LOC~~ SOPN
45.0000 [IU] | PEN_INJECTOR | Freq: Every day | SUBCUTANEOUS | Status: DC
Start: 2021-01-21 — End: 2021-01-21

## 2021-01-21 MED ORDER — TRAZODONE HCL 50 MG PO TABS
200.0000 mg | ORAL_TABLET | Freq: Every day | ORAL | Status: DC
  Administered 2021-01-22: 200 mg via ORAL
  Filled 2021-01-21: qty 4

## 2021-01-21 MED ORDER — ACETAMINOPHEN 650 MG RE SUPP: 650.0000 mg | Freq: Four times a day (QID) | RECTAL | Status: DC | PRN

## 2021-01-21 NOTE — ED Notes (Signed)
Pt placed on oxygen at 2LPM via [place]

## 2021-01-21 NOTE — ED Provider Notes (Signed)
Emergency Medicine Provider Triage Evaluation Note  Dana Dominguez , a 65 y.o. female  was evaluated in triage.  Pt complains of lightheadedness and weakness.  She was seen by [facility] cardiology yesterday and was referred to the emergency room after their labs showed anemia and hyponatremia.  She states that she has been feeling more and more lightheaded and weak over the past weeks..  Review of Systems  Positive: Lightheaded with standing, global weakness. Negative: Fevers, new abdominal pain  Physical Exam  BP 110/60 (BP Location: Right Arm)   Pulse (!) 102   Temp 98 F (36.7 C) (Oral)   Resp 18   Ht 5\' 3"  (1.6 m)   Wt 68 kg   SpO2 93%   BMI 26.57 kg/m  Gen:   Awake, no distress   Resp:  Normal effort  MSK:   Moves extremities without difficulty  Other:  Speech is not slurred.  Medical Decision Making  Medically screening exam initiated at 1:14 PM.  Appropriate orders placed.  Dana Dominguez was informed that the remainder of the evaluation will be completed by another provider, this initial triage assessment does not replace that evaluation, and the importance of remaining in the ED until their evaluation is complete.     Dana Gauze, PA-C 01/21/21 1315    03/23/21, MD 01/26/21 [phone]

## 2021-01-21 NOTE — H&P (Signed)
History and Physical    Dana GauzeJanet Dominguez ZOX:[id number] DOB: [birth year] DOA: 01/21/2021  PCP: Joya SalmSeepe, Carolyn   Patient coming from: Home.  I have personally briefly reviewed patient's old medical records in [facility]  Chief Complaint: Shortness of breath and dizziness.  HPI: Dana Dominguez is a 65 y.o. female with medical history significant of chronic back pain, type 2 diabetes, dysphagia, GERD, heart murmur, hyperlipidemia, PAD, depression, COPD, active tobacco smoker on night home oxygen who was referred by her cardiologist to the emergency department for a blood transfusion after her hemoglobin level has decreased substantially in the past few months to the point where she has been very weak, frequently dyspneic particularly on exertion, lightheaded with occasional palpitations and had a near syncopal episode resulting in cardiology holding her beta-blocker.  She had an episode of hypoxia with dizziness after ambulating in the emergency department.  She denies fever, chills, rhinorrhea or sore throat.  She has a frequent smoker's cough.  Denies recent wheezing.  No chest pain, PND, orthopnea or recent lower extremity edema.  Denies abdominal pain, nausea, emesis, diarrhea, constipation, melena or hematochezia.  No dysuria, frequency or materia.  No polyuria, polydipsia, polyphagia or blurred vision.  ED Course: Initial vital signs were temperature 98 F, pulse 102, respiration 18, BP 110/60 mmHg O2 sat 93% on room air.  The patient received a 500 mL NS bolus in the emergency department.  Lab work: Her CBC showed a white count of 7.8, hemoglobin 8.2 g/dL and platelets [id number].  Less than a year ago the patient polycythemia with a hemoglobin level often over 16 g/dL.  She is status slowly declining and was 11.2 g/dL 5 months ago.  Earlier this week, it was 7.8 g/dL at another facility.  MCV was 62.9 fL.  Platelets were 322.  Sodium 126, potassium 4.0, chloride 93 and CO2 27 mmol/L.  Renal function was  normal.  Glucose 263 and calcium was 8.7 mg/dL, but albumin was 2.6 g/dL.  All other hepatic functions were unremarkable.  Imaging: Portable 1 view chest radiograph did not show any evidence of acute cardiopulmonary disease.  However there were aortic calcifications and coronary stenting seen.  Please see images and phonated report for further detail.  Review of Systems: As per HPI otherwise all other systems reviewed and are negative.  Past Medical History:  Diagnosis Date   Back pain    Diabetes (HCC)    Dysphagia 09/29/2016   GERD (gastroesophageal reflux disease)    Heart murmur    High cholesterol    On supplemental oxygen therapy    3L [place]  at night   Past Surgical History:  Procedure Laterality Date   BACK SURGERY     x 2 in the past   carotid surgery     December 2015 rt    CHOLECYSTECTOMY     [facility]   COLON SURGERY     for diverticulitis   ESOPHAGEAL DILATION N/A 11/21/2014   Procedure: ESOPHAGEAL DILATION;  Surgeon: Malissa HippoNajeeb U Rehman, MD;  [facility];  Service: Endoscopy;  Laterality: N/A;   ESOPHAGOGASTRODUODENOSCOPY N/A 11/21/2014   Procedure: ESOPHAGOGASTRODUODENOSCOPY (EGD);  Surgeon: Malissa HippoNajeeb U Rehman, MD;  Location: [facility];  Service: Endoscopy;  Laterality: N/A;  1200 - moved to 4/7 @ 9:00   NASAL SINUS SURGERY     NECK SURGERY     stent in left arm     blockage 3 yrs ago.    Social History  reports that  she has been smoking. She has a 100.00 pack-year smoking history. She has never used smokeless tobacco. She reports that she does not drink alcohol and does not use drugs.  Allergies  Allergen Reactions   Glipizide Nausea Only, Other (See Comments) and Rash    Cold sweats    Codeine     Dizzy, nausea, cold sweat   Plavix [Clopidogrel Bisulfate]     SOB   Protonix [Pantoprazole Sodium]     ? nausea   Sporanox [Itraconazole]     Major rash   Family medical history Father heart disease  Prior to Admission medications   Medication Sig  Start Date End Date Taking? Authorizing Provider  ANORO ELLIPTA 62.5-25 MCG/INH AEPB Inhale 1 puff into the lungs daily. 12/18/20  Yes [provider]  ARIPiprazole (ABILIFY) 5 MG tablet Take 5 mg by mouth daily.   Yes [provider]  aspirin EC 81 MG tablet Take 81 mg by mouth daily.   Yes [provider]  atorvastatin (LIPITOR) 80 MG tablet Take 80 mg by mouth daily.    Yes [provider]  busPIRone (BUSPAR) 15 MG tablet Take 15 mg by mouth 3 (three) times daily.   Yes [provider]  clopidogrel (PLAVIX) 75 MG tablet Take 75 mg by mouth daily. 04/04/20 04/04/21 Yes [provider]  desloratadine (CLARINEX) 5 MG tablet Take 5 mg by mouth daily.   Yes [provider]  dexlansoprazole (DEXILANT) 60 MG capsule Take 60 mg by mouth daily.   Yes [provider]  dicyclomine (BENTYL) 10 MG capsule Take 10 mg by mouth 2 (two) times daily.   Yes [provider]  Docusate Sodium (DSS) 100 MG CAPS Take 1 capsule by mouth daily. 05/25/17  Yes [provider]  DULoxetine (CYMBALTA) 60 MG capsule Take 60 mg by mouth 2 (two) times daily.   Yes [provider]  glipiZIDE (GLUCOTROL XL) 5 MG 24 hr tablet TAKE 1 TABLET BY MOUTH EVERY DAY WITH BREAKFAST Patient taking differently: Take 5 mg by mouth daily with breakfast. 01/09/21  Yes Nida, Denman George, MD  hydroxychloroquine (PLAQUENIL) 200 MG tablet Take 200 mg by mouth daily. 09/01/20  Yes [provider]  hydrOXYzine (ATARAX/VISTARIL) 50 MG tablet Take 50 mg by mouth 2 (two) times daily.   Yes [provider]  Insulin Glargine (BASAGLAR KWIKPEN) 100 UNIT/ML Inject 60 Units into the skin at bedtime. Patient taking differently: Inject 45-50 Units into the skin at bedtime. Sliding scale 12/26/20  Yes Nida, Denman George, MD  metFORMIN (GLUCOPHAGE-XR) 500 MG 24 hr tablet TAKE 2 TABLETS BY MOUTH ONCE DAILY WITH BREAKFAST Patient taking differently:  Take 1,000 mg by mouth daily with breakfast. 01/15/21  Yes Nida, Denman George, MD  neomycin-polymyxin b-dexamethasone (MAXITROL) 3.5-10000-0.1 SUSP Place 1 drop into the left eye 4 (four) times daily. 10/16/20  Yes [provider]  ondansetron (ZOFRAN) 4 MG tablet Take 1 tablet (4 mg total) by mouth every 6 (six) hours. 01/12/20  Yes Carroll Sage, PA-C  traZODone (DESYREL) 100 MG tablet Take 200 mg by mouth at bedtime.  10/22/14  Yes [provider]  B-D ULTRAFINE III SHORT PEN 31G X 8 MM MISC SMARTSIG:1 Each SUB-Q Daily 06/17/20   Dani Gobble, NP  BD INSULIN SYRINGE U/F 31G X 5/16" 0.3 ML MISC 1 each by Other route at bedtime. 06/16/20   Roma Kayser, MD  carvedilol (COREG) 3.125 MG tablet Take 3.125 mg by mouth  daily. Patient not taking: No sig reported    [provider]  Continuous Blood Gluc Sensor (FREESTYLE LIBRE 14 DAY SENSOR) MISC INJECT 1 EACH INTO THE SKIN EVERY 14 (FOURTEEN) DAYS. USE AS DIRECTED. 12/02/20   Roma Kayser, MD  Cyanocobalamin (B-12) 2500 MCG SUBL Take 1 tablet by mouth daily. Patient not taking: No sig reported 03/31/20   [provider]  glipiZIDE (GLUCOTROL) 5 MG tablet TAKE 1 TABLET (5 MG TOTAL) BY MOUTH DAILY BEFORE BREAKFAST. 06/20/20   Roma Kayser, MD  hydrOXYzine (VISTARIL) 50 MG capsule Take 50 mg by mouth 2 (two) times daily as needed. 04/09/20   [provider]  insulin aspart (NOVOLOG) 100 UNIT/ML FlexPen Inject into the skin. Patient not taking: No sig reported 08/29/18   [provider]  Prednisolon-Moxiflox-Bromfenac 1-0.5-0.075 % SOLN instill one drop by opthalmic  route 4 times a day into surgical  eye starting 3 days preoperativel Patient not taking: No sig reported 11/18/20   [provider]   Physical Exam: Vitals:   01/21/21 1715 01/21/21 1800 01/21/21 1830 01/21/21 1936  BP: 114/66 134/67 110/62 132/72  Pulse: 97 (!) 101 90 97  Resp: 20 19 15 17   Temp:       TempSrc:      SpO2: 95% (!) 88% 92% 90%  Weight:      Height:       Constitutional: Looks chronically ill.  NAD, calm, comfortable Eyes: PERRL, lids and conjunctivae are pale. ENMT: Mucous membranes are moist. Posterior pharynx clear of any exudate or lesions. Poor state of repair of dentition. Neck: normal, supple, no masses, no thyromegaly Respiratory: clear to auscultation bilaterally, no wheezing, no crackles. Normal respiratory effort. No accessory muscle use.  Cardiovascular: Regular rate and rhythm, no murmurs / rubs / gallops. No extremity edema. 2+ pedal pulses. No carotid bruits.  Abdomen: No distention.  Bowel sounds positive.  Soft, no tenderness, no masses palpated. No hepatosplenomegaly.  Musculoskeletal: no clubbing / cyanosis. Good ROM, no contractures. Normal muscle tone.  Skin: no acute rashes, lesions, ulcers on very limited dermatological examination. Neurologic: CN 2-12 grossly intact. Sensation intact, DTR normal. Strength 5/5 in all 4.  Psychiatric: Normal judgment and insight. Alert and oriented x 3. Normal mood.   Labs on Admission: I have personally reviewed following labs and imaging studies  CBC: Recent Labs  Lab 01/21/21 1329  WBC 7.8  NEUTROABS 5.4  HGB 8.2*  HCT 32.1*  MCV 62.9*  PLT 322   Basic Metabolic Panel: Recent Labs  Lab 01/21/21 1329  NA 126*  K 4.0  CL 93*  CO2 27  GLUCOSE 263*  BUN 6*  CREATININE 0.50  CALCIUM 8.7*   GFR: Estimated Creatinine Clearance: 64.9 mL/min (by C-G formula based on SCr of 0.5 mg/dL).  Liver Function Tests: Recent Labs  Lab 01/21/21 1329  AST 19  ALT 10  ALKPHOS 101  BILITOT 0.5  PROT 7.5  ALBUMIN 2.6*   Radiological Exams on Admission: DG Chest Port 1 View  Result Date: 01/21/2021 CLINICAL DATA:  Shortness of breath EXAM: PORTABLE CHEST 1 VIEW COMPARISON:  None. FINDINGS: Cardiomediastinal silhouette is normal in size. Aortic calcifications with a great vessel vascular stent noted. There  is no focal airspace consolidation. There is no pleural effusion or visible pneumothorax. No acute osseous abnormality. IMPRESSION: No evidence of acute cardiopulmonary disease. Electronically Signed   By: 03/23/2021   On: 01/21/2021 18:08    EKG: Independently reviewed. Vent.  rate 90 BPM PR interval 201 ms QRS duration 85 ms QT/QTcB 366/448 ms P-R-T axes 56 23 44 Sinus rhythm Probable left atrial enlargement Minimal ST depression, anterolateral leads  Assessment/Plan Principal Problem:   Acute respiratory failure with hypoxia (HCC) In the setting of   Symptomatic anemia/oxygen dependent COPD Will observe in telemetry. Continue supplemental oxygen. Transfuse 1 unit of PRBC. Continue Anoro Alythia 1 puff daily. Short acting beta agonist as needed.  Active Problems:   Near syncope Secondary to symptomatic anemia. Continue holding beta-blocker. Check echocardiogram. Consider cardiology evaluation or outpatient follow-up.    Uncontrolled type 2 diabetes mellitus with hyperglycemia (HCC) Continue Glucotrol XL 5 mg p.o. daily. Continue metformin XR 1000 mg p.o. twice daily. Continue Lantus 45 units at bedtime.    Mixed hyperlipidemia Continue atorvastatin 80 mg p.o. daily.    Current smoker Smoking cessation advised multiple times. Does not require a nicotine patch at the moment. Staff to provide tobacco cessation information. Follow-up with primary care provider.    Essential hypertension, benign Carvedilol held by cardiology due to near syncopal episode. Monitor blood pressure and heart rate.    GERD (gastroesophageal reflux disease) Continue PPI.    DVT prophylaxis: SCDs. Code Status:   Full code. Family Communication: Disposition Plan:   Patient is from:  Home.  Anticipated DC to:  Home.  Anticipated DC date:  01/22/2021 or 01/23/2021.  Anticipated DC barriers: Clinical status and GI evaluation.  Consults called:  Routine gastroenterology  consult. Admission status:  Observation/telemetry.  High severity after presenting with acute on chronic respiratory with hypoxia in the setting of symptomatic anemia.  Severity of Illness:  Bobette Mo MD [facility]  How to contact the [facility] Attending or Consulting provider 7A - 7P or covering provider during after hours 7P -7A, for this patient?   Check the care team in [facility] and look for a) attending/consulting [facility] provider listed and b) the [facility] team listed Log into [email or web address] and use [place]'s universal password to access. If you do not have the password, please contact the hospital operator. Locate the [facility] provider you are looking for under [facility] and page to a number that you can be directly reached. If you still have difficulty reaching the provider, please page the [facility] (Director on Call) for the Hospitalists listed on amion for assistance.  01/21/2021, 9:20 PM   This document was prepared in [employer] and may contain some unintended transcription errors.

## 2021-01-21 NOTE — ED Notes (Signed)
Oxygen saturation 87-88% on room air after ambulating

## 2021-01-21 NOTE — ED Triage Notes (Signed)
Pt to er, pt states that she saw her md at [facility] yesterday, states that he called her today to tell her that she needed to come get a blood transfusion.  Denies bleeding or black tarry stools.

## 2021-01-21 NOTE — ED Provider Notes (Signed)
[facility] EMERGENCY DEPARTMENT Provider Note   CSN: [id number] Arrival date & time: 01/21/21  1249     History Chief Complaint  Patient presents with   Weakness    Tearra Ouk is a 65 y.o. female.  Patient with COPD and coronary disease.  She has been short of breath and weak for a number days.  She was seen by cardiology yesterday and it was determined she was anemic.  Her cardiologist recommended a blood transfusion  The history is provided by the patient and medical records. No language interpreter was used.  Weakness Severity:  Moderate Duration:  9 days Timing:  Constant Progression:  Worsening Chronicity:  Recurrent Context: not alcohol use   Relieved by:  Nothing Worsened by:  Nothing Ineffective treatments:  None tried Associated symptoms: no abdominal pain, no chest pain, no cough, no diarrhea, no frequency, no headaches and no seizures       Past Medical History:  Diagnosis Date   Back pain    Diabetes (HCC)    Dysphagia 09/29/2016   GERD (gastroesophageal reflux disease)    Heart murmur    High cholesterol    On supplemental oxygen therapy    3L [place]  at night    Patient Active Problem List   Diagnosis Date Noted   Acute respiratory failure with hypoxia (HCC) 01/21/2021   Uncontrolled type 2 diabetes mellitus with hyperglycemia (HCC) 04/26/2018   Mixed hyperlipidemia 04/26/2018   Current smoker 04/26/2018   Essential hypertension, benign 04/26/2018   Dysphagia 09/29/2016   Diabetes (HCC) 10/23/2014   Carotid stenosis 10/23/2014    Past Surgical History:  Procedure Laterality Date   BACK SURGERY     x 2 in the past   carotid surgery     December 2015 rt    CHOLECYSTECTOMY     gallstone   COLON SURGERY     for diverticulitis   ESOPHAGEAL DILATION N/A 11/21/2014   Procedure: ESOPHAGEAL DILATION;  Surgeon: Malissa Hippo, MD;  [facility];  Service: Endoscopy;  Laterality: N/A;   ESOPHAGOGASTRODUODENOSCOPY N/A 11/21/2014   Procedure:  ESOPHAGOGASTRODUODENOSCOPY (EGD);  Surgeon: Malissa Hippo, MD;  [facility];  Service: Endoscopy;  Laterality: N/A;  1200 - moved to 4/7 @ 9:00   NASAL SINUS SURGERY     NECK SURGERY     stent in left arm     blockage 3 yrs ago.      OB History   No obstetric history on file.     History reviewed. No pertinent family history.  Social History   Tobacco Use   Smoking status: Current Every Day Smoker    Packs/day: 2.00    Years: 50.00    Pack years: 100.00   Smokeless tobacco: Never Used   Tobacco comment: 1 pack day since 12 yrs  Vaping Use   Vaping Use: Never used  Substance Use Topics   Alcohol use: No    Alcohol/week: 0.0 standard drinks   Drug use: No    Home Medications Prior to Admission medications   Medication Sig Start Date End Date Taking? Authorizing Provider  ARIPiprazole (ABILIFY) 5 MG tablet Take 5 mg by mouth daily.    [provider]  aspirin EC 81 MG tablet Take 81 mg by mouth daily.    [provider]  atorvastatin (LIPITOR) 80 MG tablet Take 80 mg by mouth daily.     [provider]  B-D ULTRAFINE III SHORT PEN 31G  X 8 MM MISC SMARTSIG:1 Each SUB-Q Daily 06/17/20   Dani Gobble, NP  BD INSULIN SYRINGE U/F 31G X 5/16" 0.3 ML MISC 1 each by Other route at bedtime. 06/16/20   Roma Kayser, MD  busPIRone (BUSPAR) 15 MG tablet Take 15 mg by mouth 3 (three) times daily.    [provider]  carvedilol (COREG) 3.125 MG tablet Take 3.125 mg by mouth daily.    [provider]  clopidogrel (PLAVIX) 75 MG tablet Take by mouth. 04/04/20 04/04/21  [provider]  Continuous Blood Gluc Sensor (FREESTYLE LIBRE 14 DAY SENSOR) MISC INJECT 1 EACH INTO THE SKIN EVERY 14 (FOURTEEN) DAYS. USE AS DIRECTED. 12/02/20   Roma Kayser, MD  Cyanocobalamin (B-12) 2500 MCG SUBL Take 1 tablet by mouth daily. 03/31/20   [provider]  desloratadine (CLARINEX) 5 MG tablet Take 5 mg by mouth  daily.    [provider]  dexlansoprazole (DEXILANT) 60 MG capsule Take 60 mg by mouth daily.    [provider]  dicyclomine (BENTYL) 10 MG capsule Take 10 mg by mouth 2 (two) times daily.    [provider]  Docusate Sodium (DSS) 100 MG CAPS Take by mouth. 05/25/17   [provider]  DULoxetine (CYMBALTA) 60 MG capsule Take 60 mg by mouth 2 (two) times daily.    [provider]  glipiZIDE (GLUCOTROL XL) 5 MG 24 hr tablet TAKE 1 TABLET BY MOUTH EVERY DAY WITH BREAKFAST 01/09/21   Roma Kayser, MD  glipiZIDE (GLUCOTROL) 5 MG tablet TAKE 1 TABLET (5 MG TOTAL) BY MOUTH DAILY BEFORE BREAKFAST. 06/20/20   Roma Kayser, MD  hydroxychloroquine (PLAQUENIL) 200 MG tablet Take 200 mg by mouth daily. 09/01/20   [provider]  hydrOXYzine (ATARAX/VISTARIL) 50 MG tablet Take 50 mg by mouth 2 (two) times daily.    [provider]  hydrOXYzine (VISTARIL) 50 MG capsule Take 50 mg by mouth 2 (two) times daily as needed. 04/09/20   [provider]  Insulin Glargine (BASAGLAR KWIKPEN) 100 UNIT/ML Inject 60 Units into the skin at bedtime. 12/26/20   Roma Kayser, MD  metFORMIN (GLUCOPHAGE-XR) 500 MG 24 hr tablet TAKE 2 TABLETS BY MOUTH ONCE DAILY WITH BREAKFAST 01/15/21   Roma Kayser, MD  ondansetron (ZOFRAN) 4 MG tablet Take 1 tablet (4 mg total) by mouth every 6 (six) hours. 01/12/20   Carroll Sage, PA-C  traZODone (DESYREL) 100 MG tablet Take 200 mg by mouth at bedtime.  10/22/14   [provider]    Allergies    Glipizide, Codeine, Plavix [clopidogrel bisulfate], Protonix [pantoprazole sodium], and Sporanox [itraconazole]  Review of Systems   Review of Systems  Constitutional:  Negative for appetite change and fatigue.  HENT:  Negative for congestion, ear discharge and sinus pressure.   Eyes:  Negative for discharge.  Respiratory:  Negative for cough.   Cardiovascular:  Negative for chest  pain.  Gastrointestinal:  Negative for abdominal pain and diarrhea.  Genitourinary:  Negative for frequency and hematuria.  Musculoskeletal:  Negative for back pain.  Skin:  Negative for rash.  Neurological:  Positive for weakness. Negative for seizures and headaches.  Psychiatric/Behavioral:  Negative for hallucinations.    Physical Exam Updated Vital Signs BP 110/62   Pulse 90   Temp 98 F (36.7 C) (Oral)   Resp 15   Ht 5\' 3"  (1.6 m)   Wt 68 kg   SpO2 92%  BMI 26.57 kg/m   Physical Exam Nursing note reviewed.  Constitutional:      Appearance: She is well-developed.  HENT:     Head: Normocephalic.     Nose: Nose normal.     Mouth/Throat:     Mouth: Mucous membranes are moist.  Eyes:     General: No scleral icterus.    Conjunctiva/sclera: Conjunctivae normal.  Neck:     Thyroid: No thyromegaly.  Cardiovascular:     Rate and Rhythm: Normal rate and regular rhythm.     Heart sounds: No murmur heard.   No friction rub. No gallop.  Pulmonary:     Breath sounds: No stridor. No wheezing or rales.  Chest:     Chest wall: No tenderness.  Abdominal:     General: There is no distension.     Tenderness: There is no abdominal tenderness. There is no rebound.  Musculoskeletal:        General: Normal range of motion.     Cervical back: Neck supple.  Lymphadenopathy:     Cervical: No cervical adenopathy.  Skin:    Findings: No erythema or rash.  Neurological:     Mental Status: She is alert and oriented to person, place, and time.     Motor: No abnormal muscle tone.     Coordination: Coordination normal.  Psychiatric:        Behavior: Behavior normal.    ED Results / Procedures / Treatments   Labs (all labs ordered are listed, but only abnormal results are displayed) Labs Reviewed  COMPREHENSIVE METABOLIC PANEL - Abnormal; Notable for the following components:      Result Value   Sodium 126 (*)    Chloride 93 (*)    Glucose, Bld 263 (*)    BUN 6 (*)    Calcium  8.7 (*)    Albumin 2.6 (*)    All other components within normal limits  CBC WITH DIFFERENTIAL/PLATELET - Abnormal; Notable for the following components:   Hemoglobin 8.2 (*)    HCT 32.1 (*)    MCV 62.9 (*)    MCH 16.1 (*)    MCHC 25.5 (*)    RDW 23.7 (*)    nRBC 0.3 (*)    All other components within normal limits  FOLATE - Abnormal; Notable for the following components:   Folate 5.1 (*)    All other components within normal limits  IRON AND TIBC - Abnormal; Notable for the following components:   Iron 9 (*)    Saturation Ratios 2 (*)    All other components within normal limits  FERRITIN - Abnormal; Notable for the following components:   Ferritin 4 (*)    All other components within normal limits  RETICULOCYTES - Abnormal; Notable for the following components:   Immature Retic Fract 30.7 (*)    All other components within normal limits  PROTIME-INR  APTT  VITAMIN B12  HIV ANTIBODY (ROUTINE TESTING W REFLEX)  HEMOGLOBIN A1C  POC OCCULT BLOOD, ED  TYPE AND SCREEN    EKG None  Radiology DG Chest Port 1 View  Result Date: 01/21/2021 CLINICAL DATA:  Shortness of breath EXAM: PORTABLE CHEST 1 VIEW COMPARISON:  None. FINDINGS: Cardiomediastinal silhouette is normal in size. Aortic calcifications with a great vessel vascular stent noted. There is no focal airspace consolidation. There is no pleural effusion or visible pneumothorax. No acute osseous abnormality. IMPRESSION: No evidence of acute cardiopulmonary disease. Electronically Signed   By: Caprice Renshaw  On: 01/21/2021 18:08    Procedures Procedures   Medications Ordered in ED Medications  acetaminophen (TYLENOL) tablet 650 mg (has no administration in time range)    Or  acetaminophen (TYLENOL) suppository 650 mg (has no administration in time range)  insulin aspart (novoLOG) injection 0-9 Units (has no administration in time range)  sodium chloride 0.9 % bolus 500 mL (500 mLs Intravenous New Bag/Given 01/21/21 1857)     ED Course  I have reviewed the triage vital signs and the nursing notes.  Pertinent labs & imaging results that were available during my care of the patient were reviewed by me and considered in my medical decision making (see chart for details).    MDM Rules/Calculators/A&P                         Patient with anemia hyponatremia exacerbation of COPD.  She will be admitted to medicine Final Clinical Impression(s) / ED Diagnoses Final diagnoses:  Iron deficiency anemia due to chronic blood loss    Rx / DC Orders ED Discharge Orders     None        Bethann BerkshireZammit, Lorraine Cimmino, MD 01/22/21 1059

## 2021-01-22 ENCOUNTER — Other Ambulatory Visit (HOSPITAL_COMMUNITY): Payer: Self-pay | Admitting: *Deleted

## 2021-01-22 ENCOUNTER — Observation Stay (HOSPITAL_BASED_OUTPATIENT_CLINIC_OR_DEPARTMENT_OTHER): Payer: Medicare Other

## 2021-01-22 ENCOUNTER — Encounter (HOSPITAL_COMMUNITY): Payer: Self-pay | Admitting: Internal Medicine

## 2021-01-22 DIAGNOSIS — J9601 Acute respiratory failure with hypoxia: Principal | ICD-10-CM

## 2021-01-22 DIAGNOSIS — I340 Nonrheumatic mitral (valve) insufficiency: Secondary | ICD-10-CM

## 2021-01-22 DIAGNOSIS — J449 Chronic obstructive pulmonary disease, unspecified: Secondary | ICD-10-CM

## 2021-01-22 DIAGNOSIS — F17200 Nicotine dependence, unspecified, uncomplicated: Secondary | ICD-10-CM | POA: Diagnosis not present

## 2021-01-22 DIAGNOSIS — R55 Syncope and collapse: Secondary | ICD-10-CM | POA: Diagnosis not present

## 2021-01-22 DIAGNOSIS — D649 Anemia, unspecified: Secondary | ICD-10-CM

## 2021-01-22 DIAGNOSIS — J441 Chronic obstructive pulmonary disease with (acute) exacerbation: Secondary | ICD-10-CM | POA: Diagnosis present

## 2021-01-22 DIAGNOSIS — E782 Mixed hyperlipidemia: Secondary | ICD-10-CM

## 2021-01-22 DIAGNOSIS — I10 Essential (primary) hypertension: Secondary | ICD-10-CM

## 2021-01-22 LAB — ECHOCARDIOGRAM COMPLETE
Area-P 1/2: 3.37 cm2
Height: 63 in
S' Lateral: 2.9 cm
Weight: 2400 oz

## 2021-01-22 LAB — CBC WITH DIFFERENTIAL/PLATELET
Abs Immature Granulocytes: 0.05 10*3/uL (ref 0.00–0.07)
Basophils Absolute: 0.2 10*3/uL — ABNORMAL HIGH (ref 0.0–0.1)
Basophils Relative: 3 %
Eosinophils Absolute: 0.2 10*3/uL (ref 0.0–0.5)
Eosinophils Relative: 3 %
HCT: 36.6 % (ref 36.0–46.0)
Hemoglobin: 9.8 g/dL — ABNORMAL LOW (ref 12.0–15.0)
Immature Granulocytes: 1 %
Lymphocytes Relative: 15 %
Lymphs Abs: 1.1 10*3/uL (ref 0.7–4.0)
MCH: 17.5 pg — ABNORMAL LOW (ref 26.0–34.0)
MCHC: 26.8 g/dL — ABNORMAL LOW (ref 30.0–36.0)
MCV: 65.5 fL — ABNORMAL LOW (ref 80.0–100.0)
Monocytes Absolute: 0.7 10*3/uL (ref 0.1–1.0)
Monocytes Relative: 9 %
Neutro Abs: 5 10*3/uL (ref 1.7–7.7)
Neutrophils Relative %: 69 %
Platelets: 286 10*3/uL (ref 150–400)
RBC: 5.59 MIL/uL — ABNORMAL HIGH (ref 3.87–5.11)
RDW: 26.5 % — ABNORMAL HIGH (ref 11.5–15.5)
WBC: 7.2 10*3/uL (ref 4.0–10.5)
nRBC: 0 % (ref 0.0–0.2)

## 2021-01-22 LAB — TYPE AND SCREEN
ABO/RH(D): O POS
Antibody Screen: NEGATIVE
Unit division: 0

## 2021-01-22 LAB — BPAM RBC
Blood Product Expiration Date: 202207122359
ISSUE DATE / TIME: 202206082207
Unit Type and Rh: 5100

## 2021-01-22 LAB — SARS CORONAVIRUS 2 (TAT 6-24 HRS): SARS Coronavirus 2: NEGATIVE

## 2021-01-22 LAB — HIV ANTIBODY (ROUTINE TESTING W REFLEX): HIV Screen 4th Generation wRfx: NONREACTIVE

## 2021-01-22 MED ORDER — SODIUM CHLORIDE 0.9 % IV SOLN
250.0000 mg | Freq: Once | INTRAVENOUS | Status: AC
  Administered 2021-01-22: 250 mg via INTRAVENOUS
  Filled 2021-01-22: qty 20

## 2021-01-22 MED ORDER — BASAGLAR KWIKPEN 100 UNIT/ML ~~LOC~~ SOPN: 45.0000 [IU] | PEN_INJECTOR | Freq: Every day | SUBCUTANEOUS | Status: DC

## 2021-01-22 MED ORDER — IRON (FERROUS SULFATE) 325 (65 FE) MG PO TABS: 1.0000 | ORAL_TABLET | Freq: Every day | ORAL | 1 refills | Status: DC

## 2021-01-22 MED ORDER — UMECLIDINIUM-VILANTEROL 62.5-25 MCG/INH IN AEPB: 1.0000 | INHALATION_SPRAY | Freq: Every day | RESPIRATORY_TRACT | Status: DC

## 2021-01-22 NOTE — Plan of Care (Signed)

## 2021-01-22 NOTE — Consult Note (Signed)
@LOGO @   Referring Provider: [facility]  Primary Care Physician:  Joya SalmSeepe, Carolyn Primary Gastroenterologist: [place].  Date of Admission: 01/21/21 Date of Consultation: 01/22/21  Reason for Consultation:  Anemia  HPI:  Dana GauzeJanet Dominguez is a 65 y.o. year old female with history of chronic back pain, type 2 diabetes, heart murmur, HLD, PAD, bilateral carotid artery stenosis, COPD on night home oxygen, GERD, dysphagia, gastroparesis, mesenteric artery stenosis who was referred by her cardiologist to the emergency department for blood transfusion after hemoglobin level has decreased substantially in the past few months to the point where patient was weak, frequently dyspneic particularly on exertion, lightheaded with occasional palpitations, and had near syncopal episode resulting in cardiology holding beta-blocker.  ED course: Initial vital signs were temperature 98 F, pulse 102, respiration 18, BP 110/60 mmHg O2 sat 93% on room air.  Per H&P, patient had episode of hypoxia with dizziness after ambulating in the emergency department. CBC showed a white count of 7.8, hemoglobin 8.2 g/dL and platelets [id number].  About 1 year ago, hemoglobin level often over 16 g/dL, 09.612.2, 5 months ago, and 7.8 earlier this week at another facility.  Renal function normal.  BUN low at 6.  INR within normal limits.  Folate low at 5.1.  Iron low at 9, saturation 2%, ferritin 4.  FOBT negative. Chest x-ray with no acute findings. Echo ordered. 1 unit PRBCs ordered.  Today:  Noticed increased dizziness, weakness, and SOB with exertion increased from baseline. Progressing over the last 6 months. No brbpr. Some of an orange color to her stool at times. No black stools. No constipation, diarrhea, abdominal pain. Nausea maybe once a week. This is chronic, can be before or after eating. Occasional vomiting. Not daily. Reports history of gastroparesis. History of GERD, well controlled on Dexilant daily. Chronic  solid food dysphagia. Can't remember if last dilation was helpful. No change in symptoms recently.    Advil 1-2 times a week. Takes 2 tablets. Also takes 81 mg aspirin and plavix.   Reports last colonoscopy in [place] about 5 years ago. Thinks she had polyps. [place]  Has appt in July for evaluation of IDA.   Overall, SOB has improved. Continues to feel tired.    EGD in 2016 with esophageal mucosal appearance just above EOE s/p dilation and biopsies (slightly inflamed epithelium, no EOE), retained food debris in the stomach suggestive of gastroparesis, post bulbar duodenitis s/p biopsy (increased intraepithelial lymphocytes).   Past Medical History:  Diagnosis Date   Back pain    Diabetes (HCC)    Dysphagia 09/29/2016   GERD (gastroesophageal reflux disease)    Heart murmur    High cholesterol    On supplemental oxygen therapy    3L [place]  at night    Past Surgical History:  Procedure Laterality Date   BACK SURGERY     x 2 in the past   carotid surgery     December 2015 rt    CHOLECYSTECTOMY     [facility]   COLON SURGERY     for diverticulitis   ESOPHAGEAL DILATION N/A 11/21/2014   Procedure: ESOPHAGEAL DILATION;  Surgeon: Malissa HippoNajeeb U Rehman, MD;  [facility]: [facility];  Service: Endoscopy;  Laterality: N/A;   ESOPHAGOGASTRODUODENOSCOPY N/A 11/21/2014   Procedure: ESOPHAGOGASTRODUODENOSCOPY (EGD);  Surgeon: Malissa HippoNajeeb U Rehman, MD;  Location: [facility];  Service: Endoscopy;  Laterality: N/A;  1200 - moved to 4/7 @ 9:00   NASAL  SINUS SURGERY     NECK SURGERY     stent in left arm     blockage 3 yrs ago.     Prior to Admission medications   Medication Sig Start Date End Date Taking? Authorizing Provider  ANORO ELLIPTA 62.5-25 MCG/INH AEPB Inhale 1 puff into the lungs daily. 12/18/20  Yes [provider]  ARIPiprazole (ABILIFY) 5 MG tablet Take 5 mg by mouth daily.   Yes [provider]  aspirin EC 81 MG tablet Take 81 mg by  mouth daily.   Yes [provider]  atorvastatin (LIPITOR) 80 MG tablet Take 80 mg by mouth daily.    Yes [provider]  busPIRone (BUSPAR) 15 MG tablet Take 15 mg by mouth 3 (three) times daily.   Yes [provider]  clopidogrel (PLAVIX) 75 MG tablet Take 75 mg by mouth daily. 04/04/20 04/04/21 Yes [provider]  desloratadine (CLARINEX) 5 MG tablet Take 5 mg by mouth daily.   Yes [provider]  dexlansoprazole (DEXILANT) 60 MG capsule Take 60 mg by mouth daily.   Yes [provider]  dicyclomine (BENTYL) 10 MG capsule Take 10 mg by mouth 2 (two) times daily.   Yes [provider]  Docusate Sodium (DSS) 100 MG CAPS Take 1 capsule by mouth daily. 05/25/17  Yes [provider]  DULoxetine (CYMBALTA) 60 MG capsule Take 60 mg by mouth 2 (two) times daily.   Yes [provider]  glipiZIDE (GLUCOTROL XL) 5 MG 24 hr tablet TAKE 1 TABLET BY MOUTH EVERY DAY WITH BREAKFAST Patient taking differently: Take 5 mg by mouth daily with breakfast. 01/09/21  Yes Nida, Denman George, MD  hydroxychloroquine (PLAQUENIL) 200 MG tablet Take 200 mg by mouth daily. 09/01/20  Yes [provider]  hydrOXYzine (ATARAX/VISTARIL) 50 MG tablet Take 50 mg by mouth 2 (two) times daily.   Yes [provider]  Insulin Glargine (BASAGLAR KWIKPEN) 100 UNIT/ML Inject 60 Units into the skin at bedtime. Patient taking differently: Inject 45-50 Units into the skin at bedtime. Sliding scale 12/26/20  Yes Nida, Denman George, MD  metFORMIN (GLUCOPHAGE-XR) 500 MG 24 hr tablet TAKE 2 TABLETS BY MOUTH ONCE DAILY WITH BREAKFAST Patient taking differently: Take 1,000 mg by mouth daily with breakfast. 01/15/21  Yes Nida, Denman George, MD  neomycin-polymyxin b-dexamethasone (MAXITROL) 3.5-10000-0.1 SUSP Place 1 drop into the left eye 4 (four) times daily. 10/16/20  Yes [provider]  ondansetron (ZOFRAN) 4 MG tablet Take 1 tablet (4  mg total) by mouth every 6 (six) hours. 01/12/20  Yes Carroll Sage, PA-C  traZODone (DESYREL) 100 MG tablet Take 200 mg by mouth at bedtime.  10/22/14  Yes [provider]  B-D ULTRAFINE III SHORT PEN 31G X 8 MM MISC SMARTSIG:1 Each SUB-Q Daily 06/17/20   Dani Gobble, NP  BD INSULIN SYRINGE U/F 31G X 5/16" 0.3 ML MISC 1 each by Other route at bedtime. 06/16/20   Roma Kayser, MD  carvedilol (COREG) 3.125 MG tablet Take 3.125 mg by mouth daily. Patient not taking: No sig reported    [provider]  Continuous Blood Gluc Sensor (FREESTYLE LIBRE 14 DAY SENSOR) MISC INJECT 1 EACH INTO THE SKIN EVERY 14 (FOURTEEN) DAYS. USE AS DIRECTED. 12/02/20   Roma Kayser, MD  Cyanocobalamin (B-12) 2500 MCG SUBL Take 1 tablet by mouth daily. Patient not taking: No sig reported 03/31/20   [provider]  glipiZIDE (GLUCOTROL) 5 MG tablet  TAKE 1 TABLET (5 MG TOTAL) BY MOUTH DAILY BEFORE BREAKFAST. 06/20/20   Roma Kayser, MD  hydrOXYzine (VISTARIL) 50 MG capsule Take 50 mg by mouth 2 (two) times daily as needed. 04/09/20   [provider]  insulin aspart (NOVOLOG) 100 UNIT/ML FlexPen Inject into the skin. Patient not taking: No sig reported 08/29/18   [provider]  Prednisolon-Moxiflox-Bromfenac 1-0.5-0.075 % SOLN instill one drop by opthalmic  route 4 times a day into surgical  eye starting 3 days preoperativel Patient not taking: No sig reported 11/18/20   [provider]    Current Facility-Administered Medications  Medication Dose Route Frequency Provider Last Rate Last Admin   0.9 %  sodium chloride infusion (Manually program via Guardrails IV Fluids)   Intravenous Once Bobette Mo, MD       acetaminophen (TYLENOL) tablet 650 mg  650 mg Oral Q6H PRN Bobette Mo, MD       Or   acetaminophen (TYLENOL) suppository 650 mg  650 mg Rectal Q6H PRN Bobette Mo, MD       ARIPiprazole (ABILIFY) tablet 5 mg   5 mg Oral Daily Bobette Mo, MD       aspirin EC tablet 81 mg  81 mg Oral Daily Bobette Mo, MD       atorvastatin (LIPITOR) tablet 80 mg  80 mg Oral Daily Bobette Mo, MD       busPIRone (BUSPAR) tablet 15 mg  15 mg Oral TID Bobette Mo, MD   15 mg at 01/22/21 0003   clopidogrel (PLAVIX) tablet 75 mg  75 mg Oral Daily Bobette Mo, MD       dicyclomine (BENTYL) capsule 10 mg  10 mg Oral BID Bobette Mo, MD   10 mg at 01/22/21 0003   DULoxetine (CYMBALTA) DR capsule 60 mg  60 mg Oral BID Bobette Mo, MD   60 mg at 01/22/21 0003   glipiZIDE (GLUCOTROL XL) 24 hr tablet 5 mg  5 mg Oral Q breakfast Bobette Mo, MD       hydroxychloroquine (PLAQUENIL) tablet 200 mg  200 mg Oral Daily Bobette Mo, MD       hydrOXYzine (ATARAX/VISTARIL) tablet 50 mg  50 mg Oral BID Bobette Mo, MD   50 mg at 01/22/21 0004   insulin aspart (novoLOG) injection 0-9 Units  0-9 Units Subcutaneous TID WC Bobette Mo, MD       insulin glargine (LANTUS) injection 45 Units  45 Units Subcutaneous QHS Bobette Mo, MD       metFORMIN (GLUCOPHAGE-XR) 24 hr tablet 1,000 mg  1,000 mg Oral Q breakfast Bobette Mo, MD       neomycin-polymyxin b-dexamethasone (MAXITROL) ophthalmic suspension 1 drop  1 drop Left Eye QID Bobette Mo, MD   1 drop at 01/22/21 0004   pantoprazole (PROTONIX) EC tablet 40 mg  40 mg Oral Daily Bobette Mo, MD       traZODone (DESYREL) tablet 200 mg  200 mg Oral QHS Bobette Mo, MD   200 mg at 01/22/21 0003   umeclidinium-vilanterol (ANORO ELLIPTA) 62.5-25 MCG/INH 1 puff  1 puff Inhalation Daily Bobette Mo, MD        Allergies as of 01/21/2021 - Review Complete 01/21/2021  Allergen Reaction Noted   Glipizide Nausea Only, Other (See Comments), and Rash 08/12/2015   Codeine  10/23/2014   Plavix [clopidogrel bisulfate]  10/23/2014   Protonix [pantoprazole sodium]  10/23/2014    Sporanox [itraconazole]  10/23/2014    History reviewed. No pertinent family history.  Social History   Socioeconomic History   Marital status: Married    Spouse name: Not on file   Number of children: Not on file   Years of education: Not on file   Highest education level: Not on file  Occupational History   Not on file  Tobacco Use   Smoking status: Every Day    Packs/day: 2.00    Years: 50.00    Pack years: 100.00    Types: Cigarettes   Smokeless tobacco: Never   Tobacco comments:    1 pack day since 12 yrs  Vaping Use   Vaping Use: Never used  Substance and Sexual Activity   Alcohol use: No    Alcohol/week: 0.0 standard drinks   Drug use: No   Sexual activity: Not on file  Other Topics Concern   Not on file  Social History Narrative   Not on file   Social Determinants of Health   Financial Resource Strain: Not on file  Food Insecurity: Not on file  Transportation Needs: Not on file  Physical Activity: Not on file  Stress: Not on file  Social Connections: Not on file  Intimate Partner Violence: Not on file    Review of Systems: Gen: Denies fever, chills.  CV: Denies chest pain or palpitations. Resp: See HPI GI: See HPI GU : Denies urinary burning, urinary frequency, urinary incontinence.  Derm: Denies rash Heme: See HPI  Physical Exam: Vital signs in last 24 hours: Temp:  [98 F (36.7 C)-98.5 F (36.9 C)] 98 F (36.7 C) (06/09 0359) Pulse Rate:  [90-103] 95 (06/09 0359) Resp:  [13-20] 19 (06/09 0359) BP: (102-134)/(52-74) 108/59 (06/09 0359) SpO2:  [88 %-100 %] 93 % (06/09 0359) Weight:  [68 kg] 68 kg (06/08 1259) Last BM Date: 01/20/21 General:   Alert,  Well-developed, well-nourished, pleasant and cooperative in NAD.  Nasal cannula in place. Head:  Normocephalic and atraumatic. Eyes:  Sclera clear, no icterus.   Conjunctiva pink. Ears:  Normal auditory acuity. Lungs:  Clear throughout to auscultation.   No wheezes, crackles, or rhonchi. No  acute distress. Heart:  Regular rate and rhythm. Soft systolic murmur. No clicks, rubs,  or gallops. Abdomen:  Soft, nontender and nondistended. No masses, hepatosplenomegaly or hernias noted. Normal bowel sounds, without guarding, and without rebound.   Rectal:  Deferred Msk:  Symmetrical without gross deformities. Normal posture. Extremities:  Without edema. Neurologic:  Alert and  oriented x4; Skin:  Intact without significant lesions or rashes. Psych:  Normal mood and affect.  Intake/Output from previous day: 06/08 0701 - 06/09 0700 In: 918 [I.V.:50; Blood:368; IV Piggyback:500] Out: -  Intake/Output this shift: No intake/output data recorded.  Lab Results: Recent Labs    01/21/21 1329  WBC 7.8  HGB 8.2*  HCT 32.1*  PLT 322   BMET Recent Labs    01/21/21 1329  NA 126*  K 4.0  CL 93*  CO2 27  GLUCOSE 263*  BUN 6*  CREATININE 0.50  CALCIUM 8.7*   LFT Recent Labs    01/21/21 1329  PROT 7.5  ALBUMIN 2.6*  AST 19  ALT 10  ALKPHOS 101  BILITOT 0.5   PT/INR Recent Labs    01/21/21 1329  LABPROT 13.1  INR 1.0    Studies/Results: DG Chest Port 1 View  Result Date: 01/21/2021 CLINICAL DATA:  Shortness of breath EXAM: PORTABLE CHEST 1 VIEW COMPARISON:  None. FINDINGS: Cardiomediastinal silhouette is normal in size. Aortic calcifications with a great vessel vascular stent noted. There is no focal airspace consolidation. There is no pleural effusion or visible pneumothorax. No acute osseous abnormality. IMPRESSION: No evidence of acute cardiopulmonary disease. Electronically Signed   By: Caprice Renshaw   On: 01/21/2021 18:08    Impression:  65 y.o. year old female with history of chronic back pain, type 2 diabetes, heart murmur, HLD, PAD, bilateral carotid artery stenosis, COPD on night home oxygen, GERD, dysphagia, gastroparesis, mesenteric artery stenosis who was referred by her cardiologist to the emergency department for blood transfusion after hemoglobin level  has decreased substantially in the past few months to the point where patient was weak, frequently dyspneic particularly on exertion, lightheaded with occasional palpitations, and had near syncopal episode.  In the ED, O2 saturation was 93% on room air, but she had an episode of hypoxia with dizziness with ambulation.  She is found to have hemoglobin of 8.2 which was down from hemoglobin of 16 about a year ago and hemoglobin of 12.2, 5 months ago.  Renal function, BUN, INR all within normal limits.  Fecal occult blood negative.  She was found to have folate and iron deficiency.    Patient denies overt GI bleeding or any other obvious blood loss.  GERD well controlled on Dexilant daily.  Chronic intermittent nausea and chronic solid food dysphagia without change.  Takes Advil once or twice weekly, 81 mg aspirin, and Plavix.  Reports last colonoscopy was in [place] about 5 years ago with polyps.  Last EGD in 2016 with post bulbar duodenitis, slightly inflamed esophagus.  Since admission, she has received 1 unit PRBCs with hemoglobin up to 9.8 today. She is feeling somewhat improved but continues to feel tired.  Discussed the role of EGD and colonoscopy for further evaluation of IDA.  Patient prefers to go home and follow-up with her gastroenterologist in [place].  States she has an appointment in July for evaluation of IDA. As she has no overt GI bleeding, I feel this is reasonable. Patient states she will call the gastroenterologist to update them and request sooner appointment.  She will need iron supplementation. Discussed with hospitalist, will give IV iron.   Folate deficiency: Management per hospitalist.   Plan: Per patient's request, she will follow-up with her primary gastroenterologist in [facility] for EGD and colonoscopy for further evaluation of iron deficiency anemia.  Dysphagia to be further evaluated by primary GI provider outpatient. May benefit from esophageal  dilation. Discussed dysphagia precautions.  IV iron today. Start po ferrous sulfate 325 mg daily at discharge.  Folate replacement per hospitalist.  Avoid NSAIDs.  Continue Dexilant daily.  GI will sign off. Would be glad to see again if needed.    LOS: 0 days    01/22/2021, 7:23 AM   Ermalinda Memos, PA-C [facility]

## 2021-01-22 NOTE — Discharge Summary (Addendum)
Physician Discharge Summary  Dana Dominguez ZOX:[id number] DOB: 02-24-1956 DOA: 01/21/2021  PCP: Joya Salm GI: Pt reports she is scheduled to establish care with GI in [place] Admit date: 01/21/2021 Discharge date: 01/22/2021  Admitted From:  Home  Disposition:  Home   Recommendations for Outpatient Follow-up:  Follow up with PCP in 1 weeks Establish care with GI in 1 month to arrange EGD/colonoscopy Please obtain CBC in 1-2 weeks Please follow up on the following pending results:  2D Echocardiogram   Discharge Condition: STABLE   CODE STATUS: FULL DIET: heart healthy/Carb modified  Brief Hospitalization Summary: Please see all hospital notes, images, labs for full details of the hospitalization. ADMISSION HPI: Dana Dominguez is a 65 y.o. female with medical history significant of chronic back pain, type 2 diabetes, dysphagia, GERD, heart murmur, hyperlipidemia, PAD, depression, COPD, active tobacco smoker on night home oxygen who was referred by her cardiologist to the emergency department for a blood transfusion after her hemoglobin level has decreased substantially in the past few months to the point where she has been very weak, frequently dyspneic particularly on exertion, lightheaded with occasional palpitations and had a near syncopal episode resulting in cardiology holding her beta-blocker.  She had an episode of hypoxia with dizziness after ambulating in the emergency department.  She denies fever, chills, rhinorrhea or sore throat.  She has a frequent smoker's cough.  Denies recent wheezing.  No chest pain, PND, orthopnea or recent lower extremity edema.  Denies abdominal pain, nausea, emesis, diarrhea, constipation, melena or hematochezia.  No dysuria, frequency or materia.  No polyuria, polydipsia, polyphagia or blurred vision.   ED Course: Initial vital signs were temperature 98 F, pulse 102, respiration 18, BP 110/60 mmHg O2 sat 93% on room air.  The patient received a 500 mL NS bolus  in the emergency department.   Lab work: Her CBC showed a white count of 7.8, hemoglobin 8.2 g/dL and platelets 811.  Less than a year ago the patient polycythemia with a hemoglobin level often over 16 g/dL.  She is status slowly declining and was 11.2 g/dL 5 months ago.  Earlier this week, it was 7.8 g/dL at another facility.  MCV was 62.9 fL.  Platelets were 322.  Sodium 126, potassium 4.0, chloride 93 and CO2 27 mmol/L.  Renal function was normal.  Glucose 263 and calcium was 8.7 mg/dL, but albumin was 2.6 g/dL.  All other hepatic functions were unremarkable.  Imaging: Portable 1 view chest radiograph did not show any evidence of acute cardiopulmonary disease.  However there were aortic calcifications and coronary stenting seen.  Please see images and phonated report for further detail.  Hospital Course   Patient was admitted to the hospital for observation for symptomatic anemia associated with presyncopal events at home.  She was given 1 unit of packed red blood cells and she tolerated that very well.  Her anemia panel showed that she had a severe iron deficiency anemia with very low iron levels.  She was given a dose of IV iron in the hospital prior to discharge.  GI was consulted.  She tested Hemoccult negative.  The patient decided that she did not want to have an inpatient GI work-up at this time.  She reported to [place] that she is scheduled to have outpatient GI establish care next month for upper and lower endoscopies.  This will be in [place].  The patient is felt to be medically stable at this time.  No evidence of active bleeding.  Her hemoglobin has improved appropriately after transfusion.  She was given instructions to follow-up with her PCP and GI.  A 2D echocardiogram was ordered as part of the presyncopal work-up.  Please follow-up with final 2D echocardiogram results with PCP on outpatient follow-up.  Patient strongly advised to stop smoking cigarettes.  Close monitoring of diabetes  mellitus was recommended.  Frequent CBG monitoring.  The patient verbalized understanding.  Pt given new Rx for oral iron supplement at discharge.     Discharge Diagnoses:  Principal Problem:   Acute respiratory failure with hypoxia (HCC) Active Problems:   Uncontrolled type 2 diabetes mellitus with hyperglycemia (HCC)   Mixed hyperlipidemia   Current smoker   Essential hypertension, benign   GERD (gastroesophageal reflux disease)   COPD (chronic obstructive pulmonary disease) (HCC)   Symptomatic anemia   Discharge Instructions:  Allergies as of 01/22/2021       Reactions   Glipizide Nausea Only, Other (See Comments), Rash   Cold sweats   Codeine    Dizzy, nausea, cold sweat   Plavix [clopidogrel Bisulfate]    SOB   Protonix [pantoprazole Sodium]    ? nausea   Sporanox [itraconazole]    Major rash        Medication List     STOP taking these medications    B-12 2500 MCG Subl   carvedilol 3.125 MG tablet Commonly known as: COREG   glipiZIDE 5 MG tablet Commonly known as: GLUCOTROL   insulin aspart 100 UNIT/ML FlexPen Commonly known as: NOVOLOG   Prednisolon-Moxiflox-Bromfenac 1-0.5-0.075 % Soln       TAKE these medications    Anoro Ellipta 62.5-25 MCG/INH Aepb Generic drug: umeclidinium-vilanterol Inhale 1 puff into the lungs daily.   ARIPiprazole 5 MG tablet Commonly known as: ABILIFY Take 5 mg by mouth daily.   aspirin EC 81 MG tablet Take 81 mg by mouth daily.   atorvastatin 80 MG tablet Commonly known as: LIPITOR Take 80 mg by mouth daily.   B-D ULTRAFINE III SHORT PEN 31G X 8 MM Misc Generic drug: Insulin Pen Needle SMARTSIG:1 Each SUB-Q Daily   Basaglar KwikPen 100 UNIT/ML Inject 45-50 Units into the skin at bedtime. Sliding scale   BD Insulin Syringe U/F 31G X 5/16" 0.3 ML Misc Generic drug: Insulin Syringe-Needle U-100 1 each by Other route at bedtime.   busPIRone 15 MG tablet Commonly known as: BUSPAR Take 15 mg by mouth 3  (three) times daily.   clopidogrel 75 MG tablet Commonly known as: PLAVIX Take 75 mg by mouth daily.   desloratadine 5 MG tablet Commonly known as: CLARINEX Take 5 mg by mouth daily.   dexlansoprazole 60 MG capsule Commonly known as: DEXILANT Take 60 mg by mouth daily.   dicyclomine 10 MG capsule Commonly known as: BENTYL Take 10 mg by mouth 2 (two) times daily.   DSS 100 MG Caps Take 1 capsule by mouth daily.   DULoxetine 60 MG capsule Commonly known as: CYMBALTA Take 60 mg by mouth 2 (two) times daily.   FreeStyle Libre 14 Day Sensor Misc INJECT 1 EACH INTO THE SKIN EVERY 14 (FOURTEEN) DAYS. USE AS DIRECTED.   hydroxychloroquine 200 MG tablet Commonly known as: PLAQUENIL Take 200 mg by mouth daily.   hydrOXYzine 50 MG capsule Commonly known as: VISTARIL Take 50 mg by mouth 2 (two) times daily as needed.   hydrOXYzine 50 MG tablet Commonly known as: ATARAX/VISTARIL Take 50 mg by mouth 2 (two) times daily.   Iron (  Ferrous Sulfate) 325 (65 Fe) MG Tabs Take 1 tablet by mouth daily with breakfast.   neomycin-polymyxin b-dexamethasone 3.5-10000-0.1 Susp Commonly known as: MAXITROL Place 1 drop into the left eye 4 (four) times daily.   ondansetron 4 MG tablet Commonly known as: ZOFRAN Take 1 tablet (4 mg total) by mouth every 6 (six) hours.   traZODone 100 MG tablet Commonly known as: DESYREL Take 200 mg by mouth at bedtime.       ASK your doctor about these medications    glipiZIDE 5 MG 24 hr tablet Commonly known as: GLUCOTROL XL TAKE 1 TABLET BY MOUTH EVERY DAY WITH BREAKFAST   metFORMIN 500 MG 24 hr tablet Commonly known as: GLUCOPHAGE-XR TAKE 2 TABLETS BY MOUTH ONCE DAILY WITH BREAKFAST        Follow-up Information     Joya SalmSeepe, Carolyn. Schedule an appointment as soon as possible for a visit in 1 week(s).   Specialty: Family Medicine Why: Hospital Follow Up Contact information: [address] [place] [id number] [phone]          Gastroenterologist in [place]. Schedule an appointment as soon as possible for a visit in 1 month(s).   Why: Arrange for Colonoscopy               Allergies  Allergen Reactions   Glipizide Nausea Only, Other (See Comments) and Rash    Cold sweats    Codeine     Dizzy, nausea, cold sweat   Plavix [Clopidogrel Bisulfate]     SOB   Protonix [Pantoprazole Sodium]     ? nausea   Sporanox [Itraconazole]     Major rash   Allergies as of 01/22/2021       Reactions   Glipizide Nausea Only, Other (See Comments), Rash   Cold sweats   Codeine    Dizzy, nausea, cold sweat   Plavix [clopidogrel Bisulfate]    SOB   Protonix [pantoprazole Sodium]    ? nausea   Sporanox [itraconazole]    Major rash        Medication List     STOP taking these medications    B-12 2500 MCG Subl   carvedilol 3.125 MG tablet Commonly known as: COREG   glipiZIDE 5 MG tablet Commonly known as: GLUCOTROL   insulin aspart 100 UNIT/ML FlexPen Commonly known as: NOVOLOG   Prednisolon-Moxiflox-Bromfenac 1-0.5-0.075 % Soln       TAKE these medications    Anoro Ellipta 62.5-25 MCG/INH Aepb Generic drug: umeclidinium-vilanterol Inhale 1 puff into the lungs daily.   ARIPiprazole 5 MG tablet Commonly known as: ABILIFY Take 5 mg by mouth daily.   aspirin EC 81 MG tablet Take 81 mg by mouth daily.   atorvastatin 80 MG tablet Commonly known as: LIPITOR Take 80 mg by mouth daily.   B-D ULTRAFINE III SHORT PEN 31G X 8 MM Misc Generic drug: Insulin Pen Needle SMARTSIG:1 Each SUB-Q Daily   Basaglar KwikPen 100 UNIT/ML Inject 45-50 Units into the skin at bedtime. Sliding scale   BD Insulin Syringe U/F 31G X 5/16" 0.3 ML Misc Generic drug: Insulin Syringe-Needle U-100 1 each by Other route at bedtime.   busPIRone 15 MG tablet Commonly known as: BUSPAR Take 15 mg by mouth 3 (three) times daily.   clopidogrel 75 MG tablet Commonly known as: PLAVIX Take 75 mg by mouth daily.    desloratadine 5 MG tablet Commonly known as: CLARINEX Take 5 mg by mouth daily.   dexlansoprazole 60  MG capsule Commonly known as: DEXILANT Take 60 mg by mouth daily.   dicyclomine 10 MG capsule Commonly known as: BENTYL Take 10 mg by mouth 2 (two) times daily.   DSS 100 MG Caps Take 1 capsule by mouth daily.   DULoxetine 60 MG capsule Commonly known as: CYMBALTA Take 60 mg by mouth 2 (two) times daily.   FreeStyle Libre 14 Day Sensor Misc INJECT 1 EACH INTO THE SKIN EVERY 14 (FOURTEEN) DAYS. USE AS DIRECTED.   hydroxychloroquine 200 MG tablet Commonly known as: PLAQUENIL Take 200 mg by mouth daily.   hydrOXYzine 50 MG capsule Commonly known as: VISTARIL Take 50 mg by mouth 2 (two) times daily as needed.   hydrOXYzine 50 MG tablet Commonly known as: ATARAX/VISTARIL Take 50 mg by mouth 2 (two) times daily.   Iron (Ferrous Sulfate) 325 (65 Fe) MG Tabs Take 1 tablet by mouth daily with breakfast.   neomycin-polymyxin b-dexamethasone 3.5-10000-0.1 Susp Commonly known as: MAXITROL Place 1 drop into the left eye 4 (four) times daily.   ondansetron 4 MG tablet Commonly known as: ZOFRAN Take 1 tablet (4 mg total) by mouth every 6 (six) hours.   traZODone 100 MG tablet Commonly known as: DESYREL Take 200 mg by mouth at bedtime.       ASK your doctor about these medications    glipiZIDE 5 MG 24 hr tablet Commonly known as: GLUCOTROL XL TAKE 1 TABLET BY MOUTH EVERY DAY WITH BREAKFAST   metFORMIN 500 MG 24 hr tablet Commonly known as: GLUCOPHAGE-XR TAKE 2 TABLETS BY MOUTH ONCE DAILY WITH BREAKFAST        Procedures/Studies: DG Chest Port 1 View  Result Date: 01/21/2021 CLINICAL DATA:  Shortness of breath EXAM: PORTABLE CHEST 1 VIEW COMPARISON:  None. FINDINGS: Cardiomediastinal silhouette is normal in size. Aortic calcifications with a great vessel vascular stent noted. There is no focal airspace consolidation. There is no pleural effusion or visible  pneumothorax. No acute osseous abnormality. IMPRESSION: No evidence of acute cardiopulmonary disease. Electronically Signed   By: Caprice Renshaw   On: 01/21/2021 18:08     Subjective: Pt says she feels much better and she would prefer to go home and have outpatient GI follow up as she is scheduled to see GI in [place] already.  No black or bloody stools.    Discharge Exam: Vitals:   01/22/21 0746 01/22/21 0800  BP:  (!) 101/56  Pulse:  96  Resp:  18  Temp:  98.1 F (36.7 C)  SpO2: (!) 87% 98%   Vitals:   01/22/21 0140 01/22/21 0359 01/22/21 0746 01/22/21 0800  BP: 105/70 (!) 108/59  (!) 101/56  Pulse: 92 95  96  Resp: 16 19  18   Temp: 98.4 F (36.9 C) 98 F (36.7 C)  98.1 F (36.7 C)  TempSrc: Oral   Oral  SpO2: 98% 93% (!) 87% 98%  Weight:      Height:       General: Pt is alert, awake, not in acute distress Cardiovascular:  normal S1/S2 +, no rubs, no gallops Respiratory: CTA bilaterally, no wheezing, no rhonchi Abdominal: Soft, NT, ND, bowel sounds + Extremities: no edema, no cyanosis Neuro: nonfocal exam   The results of significant diagnostics from this hospitalization (including imaging, microbiology, ancillary and laboratory) are listed below for reference.     Microbiology: No results found for this or any previous visit (from the past 240 hour(s)).   Labs: BNP (last 3 results) No results  for input(s): BNP in the last 8760 hours. Basic Metabolic Panel: Recent Labs  Lab 01/21/21 1329  NA 126*  K 4.0  CL 93*  CO2 27  GLUCOSE 263*  BUN 6*  CREATININE 0.50  CALCIUM 8.7*   Liver Function Tests: Recent Labs  Lab 01/21/21 1329  AST 19  ALT 10  ALKPHOS 101  BILITOT 0.5  PROT 7.5  ALBUMIN 2.6*   No results for input(s): LIPASE, AMYLASE in the last 168 hours. No results for input(s): AMMONIA in the last 168 hours. CBC: Recent Labs  Lab 01/21/21 1329 01/22/21 0909  WBC 7.8 7.2  NEUTROABS 5.4 5.0  HGB 8.2* 9.8*  HCT 32.1* 36.6  MCV 62.9* 65.5*   PLT 322 286   Cardiac Enzymes: No results for input(s): CKTOTAL, CKMB, CKMBINDEX, TROPONINI in the last 168 hours. BNP: Invalid input(s): POCBNP CBG: Recent Labs  Lab 01/21/21 2217  GLUCAP 165*   D-Dimer No results for input(s): DDIMER in the last 72 hours. Hgb A1c No results for input(s): HGBA1C in the last 72 hours. Lipid Profile No results for input(s): CHOL, HDL, LDLCALC, TRIG, CHOLHDL, LDLDIRECT in the last 72 hours. Thyroid function studies No results for input(s): TSH, T4TOTAL, T3FREE, THYROIDAB in the last 72 hours.  Invalid input(s): FREET3 Anemia work up Recent Labs    01/21/21 1727  VITAMINB12 575  FOLATE 5.1*  FERRITIN 4*  TIBC 440  IRON 9*  RETICCTPCT 2.1   Urinalysis    Component Value Date/Time   COLORURINE YELLOW 01/12/2020 1352   APPEARANCEUR HAZY (A) 01/12/2020 1352   LABSPEC 1.014 01/12/2020 1352   PHURINE 6.0 01/12/2020 1352   GLUCOSEU 150 (A) 01/12/2020 1352   HGBUR NEGATIVE 01/12/2020 1352   BILIRUBINUR NEGATIVE 01/12/2020 1352   KETONESUR 20 (A) 01/12/2020 1352   PROTEINUR >=300 (A) 01/12/2020 1352   NITRITE NEGATIVE 01/12/2020 1352   LEUKOCYTESUR TRACE (A) 01/12/2020 1352   Sepsis Labs Invalid input(s): PROCALCITONIN,  WBC,  LACTICIDVEN Microbiology No results found for this or any previous visit (from the past 240 hour(s)).  Time coordinating discharge: 35 mins   SIGNED:  Standley Dakins, MD  [facility] 01/22/2021, 10:55 AM How to contact the [facility] Attending or Consulting provider 7A - 7P or covering provider during after hours 7P -7A, for this patient?  Check the care team in [facility] and look for a) attending/consulting [facility] provider listed and b) the [facility] team listed Log into [email or web address] and use [place]'s universal password to access. If you do not have the password, please contact the hospital operator. Locate the [facility] provider you are looking for under [facility] and page to a number that you can be directly  reached. If you still have difficulty reaching the provider, please page the [facility] (Director on Call) for the Hospitalists listed on amion for assistance.

## 2021-01-22 NOTE — Discharge Instructions (Signed)
PLEASE ESTABLISH CARE WITH YOUR GASTROENTEROLOGIST AS SOON AS POSSIBLE TO ARRANGE FOR A COLONOSCOPY AND EGD.    IMPORTANT INFORMATION: PAY CLOSE ATTENTION   PHYSICIAN DISCHARGE INSTRUCTIONS  Follow with Primary care provider  Joya Salm  and other consultants as instructed by your Hospitalist Physician  SEEK MEDICAL CARE OR RETURN TO EMERGENCY ROOM IF SYMPTOMS COME BACK, WORSEN OR NEW PROBLEM DEVELOPS   Please note: You were cared for by a hospitalist during your hospital stay. Every effort will be made to forward records to your primary care provider.  You can request that your primary care provider send for your hospital records if they have not received them.  Once you are discharged, your primary care physician will handle any further medical issues. Please note that NO REFILLS for any discharge medications will be authorized once you are discharged, as it is imperative that you return to your primary care physician (or establish a relationship with a primary care physician if you do not have one) for your post hospital discharge needs so that they can reassess your need for medications and monitor your lab values.  Please get a complete blood count and chemistry panel checked by your Primary MD at your next visit, and again as instructed by your Primary MD.  Get Medicines reviewed and adjusted: Please take all your medications with you for your next visit with your Primary MD  Laboratory/radiological data: Please request your Primary MD to go over all hospital tests and procedure/radiological results at the follow up, please ask your primary care provider to get all Hospital records sent to his/her office.  In some cases, they will be blood work, cultures and biopsy results pending at the time of your discharge. Please request that your primary care provider follow up on these results.  If you are diabetic, please bring your blood sugar readings with you to your follow up appointment  with primary care.    Please call and make your follow up appointments as soon as possible.    Also Note the following: If you experience worsening of your admission symptoms, develop shortness of breath, life threatening emergency, suicidal or homicidal thoughts you must seek medical attention immediately by calling 911 or calling your MD immediately  if symptoms less severe.  You must read complete instructions/literature along with all the possible adverse reactions/side effects for all the Medicines you take and that have been prescribed to you. Take any new Medicines after you have completely understood and accpet all the possible adverse reactions/side effects.   Do not drive when taking Pain medications or sleeping medications (Benzodiazepines)  Do not take more than prescribed Pain, Sleep and Anxiety Medications. It is not advisable to combine anxiety,sleep and pain medications without talking with your primary care practitioner  Special Instructions: If you have smoked or chewed Tobacco  in the last 2 yrs please stop smoking, stop any regular Alcohol  and or any Recreational drug use.  Wear Seat belts while driving.  Do not drive if taking any narcotic, mind altering or controlled substances or recreational drugs or alcohol.

## 2021-01-22 NOTE — Progress Notes (Signed)
*  PRELIMINARY RESULTS* Echocardiogram 2D Echocardiogram has been performed.  Stacey Drain 01/22/2021, 3:53 PM

## 2021-01-23 LAB — HEMOGLOBIN A1C
Hgb A1c MFr Bld: 7.7 % — ABNORMAL HIGH (ref 4.8–5.6)
Mean Plasma Glucose: 174 mg/dL

## 2021-01-23 LAB — GLUCOSE, CAPILLARY
Glucose-Capillary: 191 mg/dL — ABNORMAL HIGH (ref 70–99)
Glucose-Capillary: 181 mg/dL — ABNORMAL HIGH (ref 70–99)

## 2021-02-19 ENCOUNTER — Other Ambulatory Visit: Payer: Self-pay | Admitting: "Endocrinology

## 2021-02-19 DIAGNOSIS — E1165 Type 2 diabetes mellitus with hyperglycemia: Secondary | ICD-10-CM

## 2021-02-24 ENCOUNTER — Ambulatory Visit: Payer: Federal, State, Local not specified - PPO | Admitting: Nurse Practitioner

## 2021-03-10 ENCOUNTER — Other Ambulatory Visit: Payer: Self-pay | Admitting: "Endocrinology

## 2021-03-12 ENCOUNTER — Ambulatory Visit: Payer: Federal, State, Local not specified - PPO | Admitting: Nurse Practitioner

## 2021-03-25 ENCOUNTER — Emergency Department (HOSPITAL_COMMUNITY)
Admission: EM | Admit: 2021-03-25 | Discharge: 2021-03-25 | Disposition: A | Discharge status: Home / Self Care | Payer: Medicare Other | Attending: Emergency Medicine | Admitting: Emergency Medicine

## 2021-03-25 ENCOUNTER — Encounter (HOSPITAL_COMMUNITY): Payer: Self-pay | Admitting: Emergency Medicine

## 2021-03-25 ENCOUNTER — Other Ambulatory Visit: Payer: Self-pay

## 2021-03-25 ENCOUNTER — Emergency Department (HOSPITAL_COMMUNITY): Payer: Medicare Other

## 2021-03-25 DIAGNOSIS — R197 Diarrhea, unspecified: Secondary | ICD-10-CM | POA: Diagnosis not present

## 2021-03-25 DIAGNOSIS — R410 Disorientation, unspecified: Secondary | ICD-10-CM | POA: Insufficient documentation

## 2021-03-25 DIAGNOSIS — Z794 Long term (current) use of insulin: Secondary | ICD-10-CM | POA: Diagnosis not present

## 2021-03-25 DIAGNOSIS — Z20822 Contact with and (suspected) exposure to covid-19: Secondary | ICD-10-CM | POA: Diagnosis not present

## 2021-03-25 DIAGNOSIS — J449 Chronic obstructive pulmonary disease, unspecified: Secondary | ICD-10-CM | POA: Diagnosis not present

## 2021-03-25 DIAGNOSIS — R739 Hyperglycemia, unspecified: Secondary | ICD-10-CM

## 2021-03-25 DIAGNOSIS — E119 Type 2 diabetes mellitus without complications: Secondary | ICD-10-CM | POA: Diagnosis not present

## 2021-03-25 DIAGNOSIS — R1084 Generalized abdominal pain: Secondary | ICD-10-CM | POA: Insufficient documentation

## 2021-03-25 DIAGNOSIS — K625 Hemorrhage of anus and rectum: Secondary | ICD-10-CM | POA: Insufficient documentation

## 2021-03-25 DIAGNOSIS — R112 Nausea with vomiting, unspecified: Secondary | ICD-10-CM | POA: Insufficient documentation

## 2021-03-25 DIAGNOSIS — Z7902 Long term (current) use of antithrombotics/antiplatelets: Secondary | ICD-10-CM | POA: Diagnosis not present

## 2021-03-25 DIAGNOSIS — K529 Noninfective gastroenteritis and colitis, unspecified: Secondary | ICD-10-CM

## 2021-03-25 DIAGNOSIS — Z7984 Long term (current) use of oral hypoglycemic drugs: Secondary | ICD-10-CM | POA: Insufficient documentation

## 2021-03-25 DIAGNOSIS — I10 Essential (primary) hypertension: Secondary | ICD-10-CM | POA: Diagnosis not present

## 2021-03-25 DIAGNOSIS — Z79899 Other long term (current) drug therapy: Secondary | ICD-10-CM | POA: Diagnosis not present

## 2021-03-25 DIAGNOSIS — R1110 Vomiting, unspecified: Secondary | ICD-10-CM

## 2021-03-25 DIAGNOSIS — F17210 Nicotine dependence, cigarettes, uncomplicated: Secondary | ICD-10-CM | POA: Diagnosis not present

## 2021-03-25 LAB — LIPASE, BLOOD: Lipase: 19 U/L (ref 11–51)

## 2021-03-25 LAB — COMPREHENSIVE METABOLIC PANEL
ALT: 12 U/L (ref 0–44)
AST: 20 U/L (ref 15–41)
Albumin: 3.3 g/dL — ABNORMAL LOW (ref 3.5–5.0)
Alkaline Phosphatase: 140 U/L — ABNORMAL HIGH (ref 38–126)
Anion gap: 11 (ref 5–15)
BUN: 8 mg/dL (ref 8–23)
CO2: 24 mmol/L (ref 22–32)
Calcium: 9.2 mg/dL (ref 8.9–10.3)
Chloride: 93 mmol/L — ABNORMAL LOW (ref 98–111)
Creatinine, Ser: 0.64 mg/dL (ref 0.44–1.00)
GFR, Estimated: >60 mL/min (ref >60)
Glucose, Bld: 383 mg/dL — ABNORMAL HIGH (ref 70–99)
Potassium: 4.3 mmol/L (ref 3.5–5.1)
Sodium: 128 mmol/L — ABNORMAL LOW (ref 135–145)
Total Bilirubin: 0.6 mg/dL (ref 0.3–1.2)
Total Protein: 9.2 g/dL — ABNORMAL HIGH (ref 6.5–8.1)

## 2021-03-25 LAB — URINALYSIS, ROUTINE W REFLEX MICROSCOPIC
Bacteria, UA: NONE SEEN
Bilirubin Urine: NEGATIVE
Glucose, UA: >=500 mg/dL — AB
Hgb urine dipstick: NEGATIVE
Ketones, ur: 20 mg/dL — AB
Leukocytes,Ua: NEGATIVE
Nitrite: NEGATIVE
Protein, ur: >=300 mg/dL — AB
Specific Gravity, Urine: 1.023 (ref 1.005–1.030)
pH: 7 (ref 5.0–8.0)

## 2021-03-25 LAB — CBC
HCT: 44.5 % (ref 36.0–46.0)
Hemoglobin: 12.9 g/dL (ref 12.0–15.0)
MCH: 21.3 pg — ABNORMAL LOW (ref 26.0–34.0)
MCHC: 29 g/dL — ABNORMAL LOW (ref 30.0–36.0)
MCV: 73.6 fL — ABNORMAL LOW (ref 80.0–100.0)
Platelets: 373 10*3/uL (ref 150–400)
RBC: 6.05 MIL/uL — ABNORMAL HIGH (ref 3.87–5.11)
RDW: 26.5 % — ABNORMAL HIGH (ref 11.5–15.5)
WBC: 25.3 10*3/uL — ABNORMAL HIGH (ref 4.0–10.5)
nRBC: 0 % (ref 0.0–0.2)

## 2021-03-25 LAB — RESP PANEL BY RT-PCR (FLU A&B, COVID) ARPGX2
Influenza A by PCR: NEGATIVE
Influenza B by PCR: NEGATIVE
SARS Coronavirus 2 by RT PCR: NEGATIVE

## 2021-03-25 LAB — CBG MONITORING, ED: Glucose-Capillary: 322 mg/dL — ABNORMAL HIGH (ref 70–99)

## 2021-03-25 MED ORDER — CIPROFLOXACIN HCL 500 MG PO TABS: 500.0000 mg | ORAL_TABLET | Freq: Two times a day (BID) | ORAL | 0 refills | Status: DC

## 2021-03-25 MED ORDER — PROBIOTIC & ACIDOPHILUS EX ST PO CAPS: 1.0000 | ORAL_CAPSULE | Freq: Every day | ORAL | 0 refills | Status: DC

## 2021-03-25 MED ORDER — INSULIN ASPART 100 UNIT/ML IJ SOLN
8.0000 [IU] | Freq: Once | INTRAMUSCULAR | Status: AC
  Administered 2021-03-25: 8 [IU] via SUBCUTANEOUS
  Filled 2021-03-25: qty 1

## 2021-03-25 MED ORDER — HYDROMORPHONE HCL 1 MG/ML IJ SOLN
0.5000 mg | Freq: Once | INTRAMUSCULAR | Status: AC
  Administered 2021-03-25: 0.5 mg via INTRAVENOUS
  Filled 2021-03-25: qty 1

## 2021-03-25 MED ORDER — SODIUM CHLORIDE 0.9 % IV SOLN: INTRAVENOUS | Status: DC

## 2021-03-25 MED ORDER — CIPROFLOXACIN IN D5W 400 MG/200ML IV SOLN
400.0000 mg | Freq: Two times a day (BID) | INTRAVENOUS | Status: DC
  Administered 2021-03-25: 400 mg via INTRAVENOUS
  Filled 2021-03-25: qty 200

## 2021-03-25 MED ORDER — IOHEXOL 350 MG/ML SOLN
80.0000 mL | Freq: Once | INTRAVENOUS | Status: AC | PRN
  Administered 2021-03-25: 80 mL via INTRAVENOUS

## 2021-03-25 MED ORDER — HYDROMORPHONE HCL 1 MG/ML IJ SOLN
0.5000 mg | Freq: Once | INTRAMUSCULAR | Status: AC
Start: 2021-03-25 — End: 2021-03-25
  Administered 2021-03-25: 0.5 mg via INTRAVENOUS
  Filled 2021-03-25: qty 1

## 2021-03-25 MED ORDER — ONDANSETRON 4 MG PO TBDP: 4.0000 mg | ORAL_TABLET | Freq: Three times a day (TID) | ORAL | 0 refills | Status: DC | PRN

## 2021-03-25 MED ORDER — HYDROCODONE-ACETAMINOPHEN 5-325 MG PO TABS: 1.0000 | ORAL_TABLET | ORAL | 0 refills | Status: DC | PRN

## 2021-03-25 MED ORDER — ONDANSETRON HCL 4 MG/2ML IJ SOLN
4.0000 mg | Freq: Once | INTRAMUSCULAR | Status: AC
  Administered 2021-03-25: 4 mg via INTRAVENOUS
  Filled 2021-03-25: qty 2

## 2021-03-25 MED ORDER — INSULIN REGULAR BOLUS VIA INFUSION: 8.0000 [IU] | Freq: Once | INTRAVENOUS | Status: DC

## 2021-03-25 MED ORDER — METRONIDAZOLE 500 MG PO TABS
500.0000 mg | ORAL_TABLET | Freq: Two times a day (BID) | ORAL | 0 refills | Status: DC
Start: 2021-03-25 — End: 2021-07-28

## 2021-03-25 MED ORDER — METRONIDAZOLE 500 MG/100ML IV SOLN
500.0000 mg | Freq: Three times a day (TID) | INTRAVENOUS | Status: DC
  Administered 2021-03-25: 500 mg via INTRAVENOUS
  Filled 2021-03-25: qty 100

## 2021-03-25 MED ORDER — SODIUM CHLORIDE 0.9 % IV BOLUS
1000.0000 mL | Freq: Once | INTRAVENOUS | Status: AC
  Administered 2021-03-25: 1000 mL via INTRAVENOUS

## 2021-03-25 NOTE — ED Provider Notes (Signed)
[facility] EMERGENCY DEPARTMENT Provider Note   CSN: [id number] Arrival date & time: 03/25/21  1149     History Chief Complaint  Patient presents with   Rectal Bleeding    Dana Dominguez is a 65 y.o. female.  Patient with the onset of abdominal pain nausea vomiting and diarrhea on Monday.  No blood in the vomit or bowel movements until after midnight today.  Then has had some streaking and small amounts of blood.  Does not stain the commode water all red.  It was evaluated in June with an admission for anemia thought to be of chronic disease or chronic blood loss.  Received 1 unit packed red blood cells.  Patient has a history of fairly significant COPD.  And has oxygen for use at home at night.  Patient is not on any blood thinners.  Past medical history significant for diabetes gastroesophageal reflux disease and COPD.  Patient currently not on any steroids.      Past Medical History:  Diagnosis Date   Back pain    COPD (chronic obstructive pulmonary disease) (HCC)    Diabetes (HCC)    Dysphagia 09/29/2016   GERD (gastroesophageal reflux disease)    Heart murmur    High cholesterol    On supplemental oxygen therapy    3L [place]  at night    Patient Active Problem List   Diagnosis Date Noted   COPD (chronic obstructive pulmonary disease) (HCC) 01/22/2021   Symptomatic anemia 01/22/2021   Acute respiratory failure with hypoxia (HCC) 01/21/2021   GERD (gastroesophageal reflux disease)    Uncontrolled type 2 diabetes mellitus with hyperglycemia (HCC) 04/26/2018   Mixed hyperlipidemia 04/26/2018   Current smoker 04/26/2018   Essential hypertension, benign 04/26/2018   Dysphagia 09/29/2016   Diabetes (HCC) 10/23/2014   Carotid stenosis 10/23/2014    Past Surgical History:  Procedure Laterality Date   BACK SURGERY     x 2 in the past   carotid surgery     December 2015 rt    CHOLECYSTECTOMY     [facility]   COLON SURGERY     for diverticulitis   ESOPHAGEAL DILATION N/A  11/21/2014   Procedure: ESOPHAGEAL DILATION;  Surgeon: Malissa Hippo, MD;  Location: [facility];  Service: Endoscopy;  Laterality: N/A;   ESOPHAGOGASTRODUODENOSCOPY N/A 11/21/2014   Procedure: ESOPHAGOGASTRODUODENOSCOPY (EGD);  Surgeon: Malissa Hippo, MD;  Location: [facility];  Service: Endoscopy;  Laterality: N/A;  1200 - moved to 4/7 @ 9:00   NASAL SINUS SURGERY     NECK SURGERY     stent in left arm     blockage 3 yrs ago.      OB History   No obstetric history on file.     Family History  Problem Relation Age of Onset   Cancer - Colon Neg Hx     Social History   Tobacco Use   Smoking status: Every Day    Packs/day: 2.00    Years: 50.00    Pack years: 100.00    Types: Cigarettes   Smokeless tobacco: Never   Tobacco comments:    1 pack day since 12 yrs  Vaping Use   Vaping Use: Never used  Substance Use Topics   Alcohol use: No    Alcohol/week: 0.0 standard drinks   Drug use: No    Home Medications Prior to Admission medications   Medication Sig Start Date End Date Taking? Authorizing Provider  ANORO ELLIPTA 62.5-25 MCG/INH AEPB  Inhale 1 puff into the lungs daily. 12/18/20   [provider]  ARIPiprazole (ABILIFY) 5 MG tablet Take 5 mg by mouth daily.    [provider]  aspirin EC 81 MG tablet Take 81 mg by mouth daily.    [provider]  atorvastatin (LIPITOR) 80 MG tablet Take 80 mg by mouth daily.     [provider]  B-D ULTRAFINE III SHORT PEN 31G X 8 MM MISC SMARTSIG:1 Each SUB-Q Daily 06/17/20   Dani Gobbleeardon, Whitney J, NP  BD INSULIN SYRINGE U/F 31G X 5/16" 0.3 ML MISC 1 each by Other route at bedtime. 06/16/20   Roma KayserNida, Gebreselassie W, MD  busPIRone (BUSPAR) 15 MG tablet Take 15 mg by mouth 3 (three) times daily.    [provider]  clopidogrel (PLAVIX) 75 MG tablet Take 75 mg by mouth daily. 04/04/20 04/04/21  [provider]  Continuous Blood Gluc Sensor (FREESTYLE LIBRE 14 DAY SENSOR) MISC INJECT 1  SENSOR INTO THE SKIN EVERY 14 DAYS AS DIRECTED 02/19/21   Roma KayserNida, Gebreselassie W, MD  desloratadine (CLARINEX) 5 MG tablet Take 5 mg by mouth daily.    [provider]  dexlansoprazole (DEXILANT) 60 MG capsule Take 60 mg by mouth daily.    [provider]  dicyclomine (BENTYL) 10 MG capsule Take 10 mg by mouth 2 (two) times daily.    [provider]  Docusate Sodium (DSS) 100 MG CAPS Take 1 capsule by mouth daily. 05/25/17   [provider]  DULoxetine (CYMBALTA) 60 MG capsule Take 60 mg by mouth 2 (two) times daily.    [provider]  glipiZIDE (GLUCOTROL XL) 5 MG 24 hr tablet TAKE 1 TABLET BY MOUTH EVERY DAY WITH BREAKFAST 03/11/21   Roma KayserNida, Gebreselassie W, MD  hydroxychloroquine (PLAQUENIL) 200 MG tablet Take 200 mg by mouth daily. 09/01/20   [provider]  hydrOXYzine (ATARAX/VISTARIL) 50 MG tablet Take 50 mg by mouth 2 (two) times daily.    [provider]  hydrOXYzine (VISTARIL) 50 MG capsule Take 50 mg by mouth 2 (two) times daily as needed. 04/09/20   [provider]  Insulin Glargine (BASAGLAR KWIKPEN) 100 UNIT/ML Inject 45-50 Units into the skin at bedtime. Sliding scale 01/22/21   Cleora FleetJohnson, Clanford L, MD  Iron, Ferrous Sulfate, 325 (65 Fe) MG TABS Take 1 tablet by mouth daily with breakfast. 01/22/21   Johnson, Clanford L, MD  metFORMIN (GLUCOPHAGE-XR) 500 MG 24 hr tablet TAKE 2 TABLETS BY MOUTH ONCE DAILY WITH BREAKFAST Patient taking differently: Take 1,000 mg by mouth daily with breakfast. 01/15/21   Nida, Denman GeorgeGebreselassie W, MD  neomycin-polymyxin b-dexamethasone (MAXITROL) 3.5-10000-0.1 SUSP Place 1 drop into the left eye 4 (four) times daily. 10/16/20   [provider]  ondansetron (ZOFRAN) 4 MG tablet Take 1 tablet (4 mg total) by mouth every 6 (six) hours. 01/12/20   Carroll SageFaulkner, William J, PA-C  traZODone (DESYREL) 100 MG tablet Take 200 mg by mouth at bedtime.  10/22/14   [provider]    Allergies     Glipizide, Codeine, Plavix [clopidogrel bisulfate], Protonix [pantoprazole sodium], and Sporanox [itraconazole]  Review of Systems   Review of Systems  Constitutional:  Negative for chills and fever.  HENT:  Negative for ear pain and sore throat.   Eyes:  Negative for pain and visual disturbance.  Respiratory:  Negative for cough and shortness of breath.   Cardiovascular:  Negative for chest pain and palpitations.  Gastrointestinal:  Positive for  abdominal pain, blood in stool, diarrhea and vomiting.  Genitourinary:  Negative for dysuria and hematuria.  Musculoskeletal:  Negative for arthralgias and back pain.  Skin:  Negative for color change and rash.  Neurological:  Negative for seizures and syncope.  All other systems reviewed and are negative.  Physical Exam Updated Vital Signs BP 119/85 (BP Location: Right Arm)   Pulse 80   Temp 98 F (36.7 C) (Oral)   Resp 20   Ht 1.6 m (5\' 3" )   Wt 68 kg   SpO2 100%   BMI 26.57 kg/m   Physical Exam Vitals and nursing note reviewed.  Constitutional:      General: She is not in acute distress.    Appearance: She is well-developed.  HENT:     Head: Normocephalic and atraumatic.     Mouth/Throat:     Mouth: Mucous membranes are dry.     Comments: Mucous membranes dry Eyes:     Extraocular Movements: Extraocular movements intact.     Conjunctiva/sclera: Conjunctivae normal.     Pupils: Pupils are equal, round, and reactive to light.  Cardiovascular:     Rate and Rhythm: Normal rate and regular rhythm.     Heart sounds: No murmur heard. Pulmonary:     Effort: Pulmonary effort is normal. No respiratory distress.     Breath sounds: Normal breath sounds. No stridor. No wheezing, rhonchi or rales.  Abdominal:     General: There is no distension.     Palpations: Abdomen is soft.     Tenderness: There is no abdominal tenderness. There is no guarding.  Genitourinary:    Rectum: Guaiac result positive.  Musculoskeletal:      Cervical back: Neck supple.  Skin:    General: Skin is warm and dry.  Neurological:     General: No focal deficit present.     Mental Status: She is alert. Mental status is at baseline. She is disoriented.     Cranial Nerves: No cranial nerve deficit.     Sensory: No sensory deficit.     Motor: No weakness.    ED Results / Procedures / Treatments   Labs (all labs ordered are listed, but only abnormal results are displayed) Labs Reviewed  COMPREHENSIVE METABOLIC PANEL - Abnormal; Notable for the following components:      Result Value   Sodium 128 (*)    Chloride 93 (*)    Glucose, Bld 383 (*)    Total Protein 9.2 (*)    Albumin 3.3 (*)    Alkaline Phosphatase 140 (*)    All other components within normal limits  CBC - Abnormal; Notable for the following components:   WBC 25.3 (*)    RBC 6.05 (*)    MCV 73.6 (*)    MCH 21.3 (*)    MCHC 29.0 (*)    RDW 26.5 (*)    All other components within normal limits  URINALYSIS, ROUTINE W REFLEX MICROSCOPIC - Abnormal; Notable for the following components:   Glucose, UA >=500 (*)    Ketones, ur 20 (*)    Protein, ur >=300 (*)    All other components within normal limits  RESP PANEL BY RT-PCR (FLU A&B, COVID) ARPGX2  LIPASE, BLOOD  POC OCCULT BLOOD, ED  TYPE AND SCREEN    EKG None  Radiology DG Chest Port 1 View  Result Date: 03/25/2021 CLINICAL DATA:  Hypoxia.  Bloody stools.  Abdominal pain. EXAM: PORTABLE CHEST 1 VIEW COMPARISON:  01/21/2021.  FINDINGS: Heart size is normal. Chronic aortic atherosclerosis is noted. Brachiocephalic vessel stent in place. Chronic interstitial lung markings. No sign of active infiltrate, mass, effusion or collapse. No acute bone finding. IMPRESSION: No active disease. Aortic atherosclerosis. Brachiocephalic vessel stent. Chronic interstitial lung markings consistent with COPD. Electronically Signed   By: Paulina Fusi M.D.   On: 03/25/2021 14:22    Procedures Procedures   Medications Ordered in  ED Medications  0.9 %  sodium chloride infusion (has no administration in time range)  sodium chloride 0.9 % bolus 1,000 mL (1,000 mLs Intravenous New Bag/Given 03/25/21 1456)  ondansetron (ZOFRAN) injection 4 mg (4 mg Intravenous Given 03/25/21 1456)  HYDROmorphone (DILAUDID) injection 0.5 mg (0.5 mg Intravenous Given 03/25/21 1456)  iohexol (OMNIPAQUE) 350 MG/ML injection 80 mL (80 mLs Intravenous Contrast Given 03/25/21 1535)    ED Course  I have reviewed the triage vital signs and the nursing notes.  Pertinent labs & imaging results that were available during my care of the patient were reviewed by me and considered in my medical decision making (see chart for details).    MDM Rules/Calculators/A&P                          Patient with complaint abdominal pain nausea vomiting and diarrhea and then starting after midnight some red blood per rectum.  Is heme positive. Patient also clinically appears dehydrated. Leukocytosis white blood cell count 25,000.  And patient without being on steroids. Electrolytes some mild hyponatremia with a sodium of 128.  Glucose elevated to 383 LFTs without significant abnormalities.  Lipase is not elevated.  Patient did have a hypoxic episode here where she dropped her sats on room air now placed on 2 L.  Chest x-ray without any acute findings.  CT scan of abdomen is pending and will be followed up by Dr. Shirlee Limerick evening emergency physician.  And also patient does have COVID test ordered as well.    Final Clinical Impression(s) / ED Diagnoses Final diagnoses:  Rectal bleeding  Generalized abdominal pain  Vomiting and diarrhea    Rx / DC Orders ED Discharge Orders     None        Vanetta Mulders, MD 03/25/21 1541

## 2021-03-25 NOTE — ED Provider Notes (Signed)
Pt signed out by Dr. Deretha Emory pending CT abd/pelvis.   CT:    IMPRESSION:  1. Findings compatible with of colitis of the ascending colon. No  evidence of perforation or abscess.  2. Extensive calcific atherosclerosis of the aorta and its branch  vessels.  3. Chronic severe lower lumbar degenerative change.   Pt denies any recent abx or exposure to anyone with diarrhea.  She has not had any diarrhea while here to get a sample.  She is given her first dose of cipro/flagyl in the ED.  She is tolerating po fluids.  She is to return if worse.  F/u with GI.    Jacalyn Lefevre, MD 03/25/21 [phone]

## 2021-03-25 NOTE — ED Triage Notes (Signed)
Pt to the ED with bloody stools last night and abdominal pain. Pt was seen by GI a month ago but has not had testing yet.

## 2021-03-25 NOTE — ED Notes (Signed)
pt o2 dropped to 84% with good wave form.  placed on 2lpm o2 via [place].  o2 sats increased to 96.  md informed

## 2021-03-26 LAB — TYPE AND SCREEN
ABO/RH(D): O POS
Antibody Screen: POSITIVE
DAT, IgG: NEGATIVE
DAT, complement: NEGATIVE

## 2021-04-10 LAB — COMPREHENSIVE METABOLIC PANEL
ALT: 5 IU/L (ref 0–32)
AST: 15 IU/L (ref 0–40)
Albumin/Globulin Ratio: 0.7 — ABNORMAL LOW (ref 1.2–2.2)
Albumin: 3.3 g/dL — ABNORMAL LOW (ref 3.8–4.8)
Alkaline Phosphatase: 135 IU/L — ABNORMAL HIGH (ref 44–121)
BUN/Creatinine Ratio: 9 — ABNORMAL LOW (ref 12–28)
BUN: 6 mg/dL — ABNORMAL LOW (ref 8–27)
Bilirubin Total: 0.3 mg/dL (ref 0.0–1.2)
CO2: 26 mmol/L (ref 20–29)
Calcium: 8.9 mg/dL (ref 8.7–10.3)
Chloride: 91 mmol/L — ABNORMAL LOW (ref 96–106)
Creatinine, Ser: 0.66 mg/dL (ref 0.57–1.00)
Globulin, Total: 4.5 g/dL (ref 1.5–4.5)
Glucose: 277 mg/dL — ABNORMAL HIGH (ref 65–99)
Potassium: 5 mmol/L (ref 3.5–5.2)
Sodium: 129 mmol/L — ABNORMAL LOW (ref 134–144)
Total Protein: 7.8 g/dL (ref 6.0–8.5)
eGFR: 97 mL/min/{1.73_m2} (ref >59)

## 2021-04-10 LAB — LIPID PANEL
Chol/HDL Ratio: 2.1 ratio (ref 0.0–4.4)
Cholesterol, Total: 101 mg/dL (ref 100–199)
HDL: 47 mg/dL (ref >39)
LDL Chol Calc (NIH): 36 mg/dL (ref 0–99)
Triglycerides: 92 mg/dL (ref 0–149)
VLDL Cholesterol Cal: 18 mg/dL (ref 5–40)

## 2021-04-10 LAB — TSH: TSH: 2.07 u[IU]/mL (ref 0.450–4.500)

## 2021-04-10 LAB — T4, FREE: Free T4: 1.45 ng/dL (ref 0.82–1.77)

## 2021-04-10 LAB — VITAMIN D 25 HYDROXY (VIT D DEFICIENCY, FRACTURES): Vit D, 25-Hydroxy: 7.1 ng/mL — ABNORMAL LOW (ref 30.0–100.0)

## 2021-04-26 IMAGING — CT CT ABD-PELV W/ CM
2 of 5 series · 16 of 46 positions shown, 18 images · IV contrast (Omnipaque or Isovue)
Comparison: None.

CLINICAL DATA: Nausea and vomiting for several days

EXAM:
CT ABDOMEN AND PELVIS WITH CONTRAST
TECHNIQUE: Multidetector CT imaging of the abdomen and pelvis was performed
using the standard protocol following bolus administration of
intravenous contrast.
CONTRAST:  100mL OMNIPAQUE IOHEXOL 300 MG/ML  SOLN

[Series 2: axial st · axial · 0.86mm/px · z∈[+584,+974]mm · 13 of 88 slices shown, 15 images]
[im 5/88  soft-tissue]
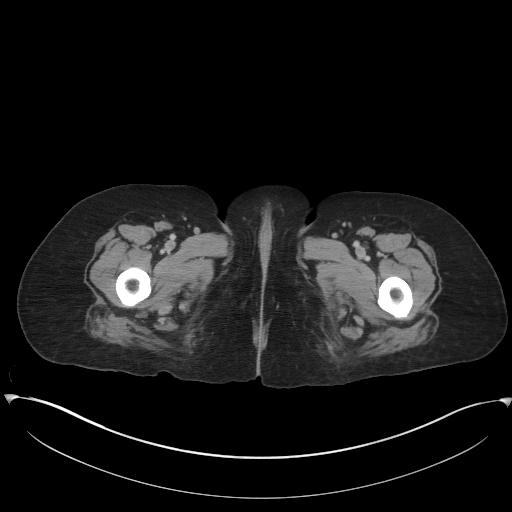
[im 5/88  bone]
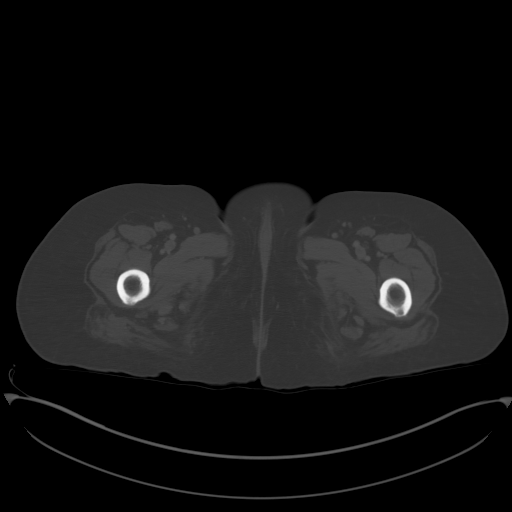
[im 14/88  soft-tissue]
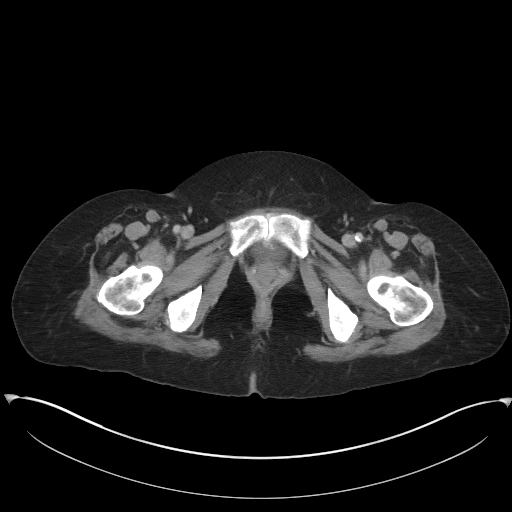
[im 19/88  soft-tissue]
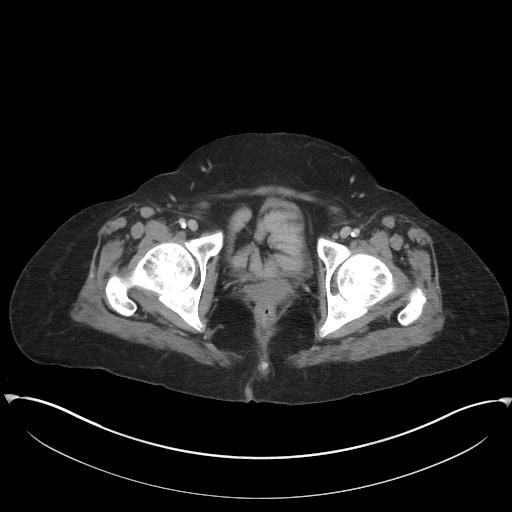
[im 23/88  soft-tissue]
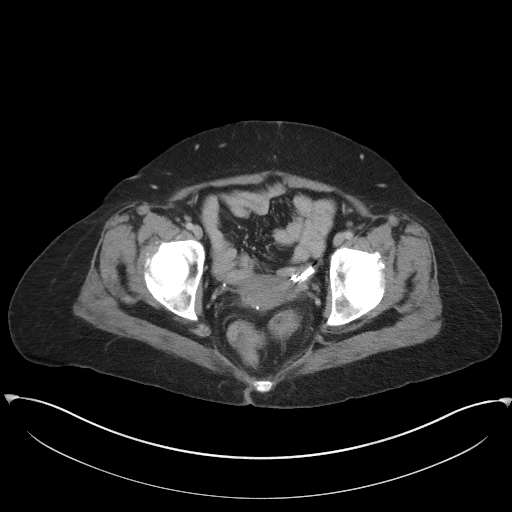
[im 33/88  soft-tissue]
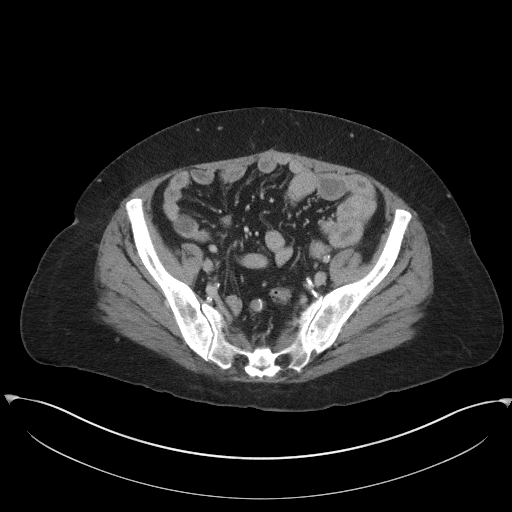
[im 37/88  soft-tissue]
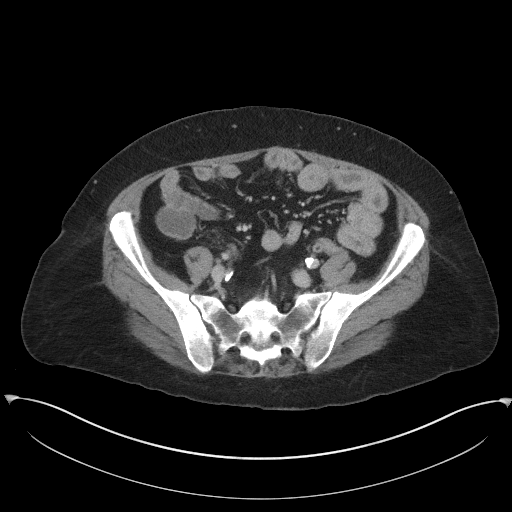
[im 46/88  soft-tissue]
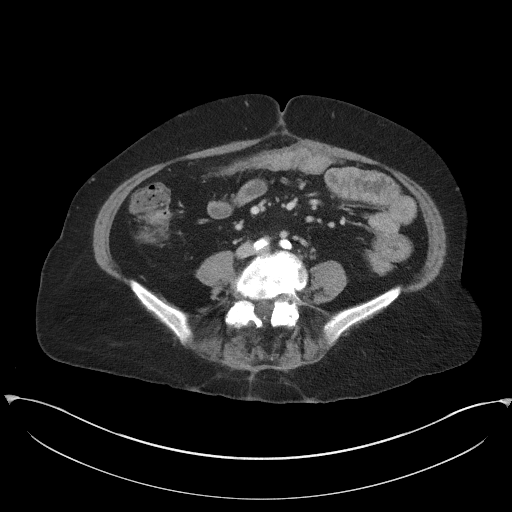
[im 51/88  soft-tissue]
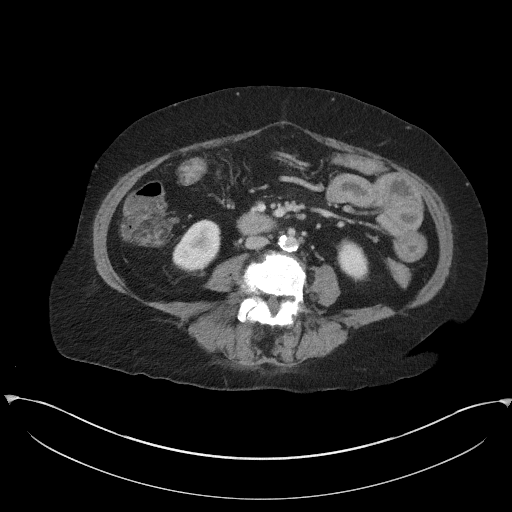
[im 55/88  soft-tissue]
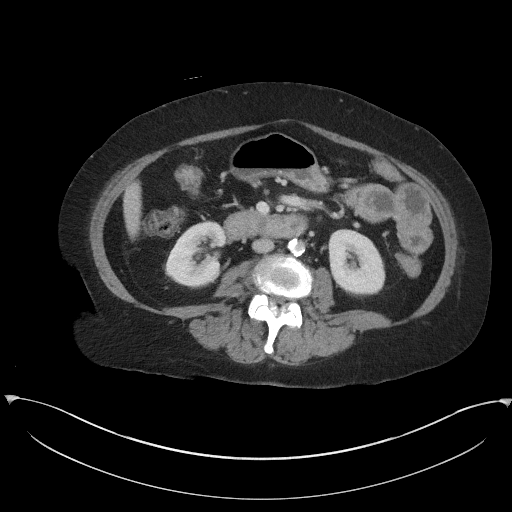
[im 55/88  bone]
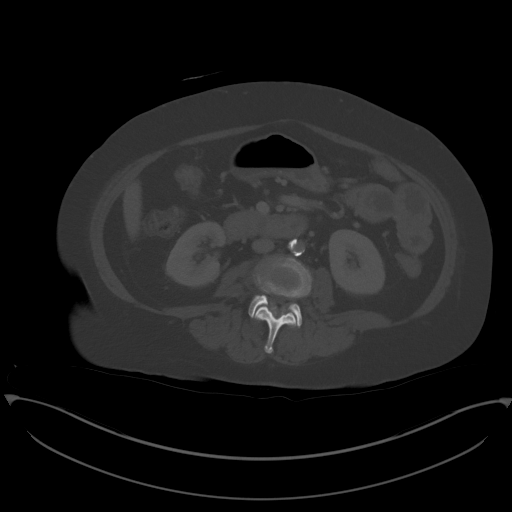
[im 65/88  soft-tissue]
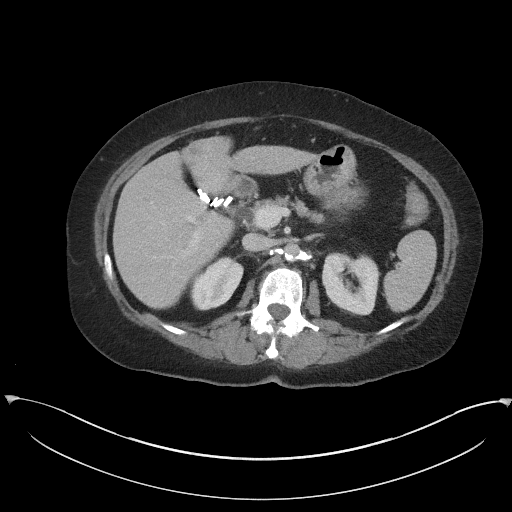
[im 69/88  soft-tissue]
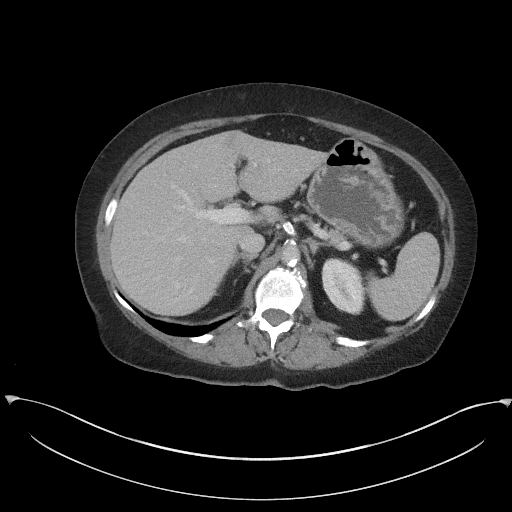
[im 74/88  soft-tissue]
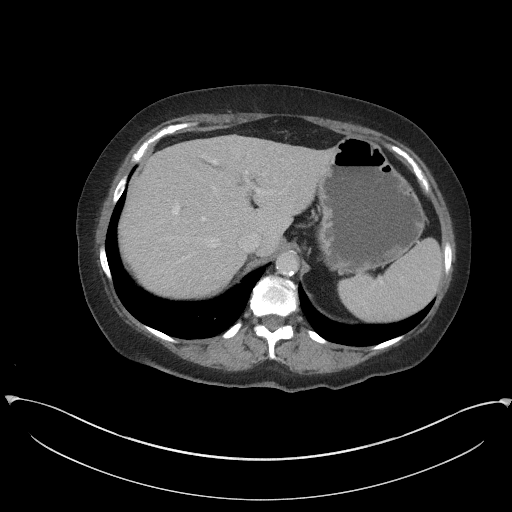
[im 83/88  soft-tissue]
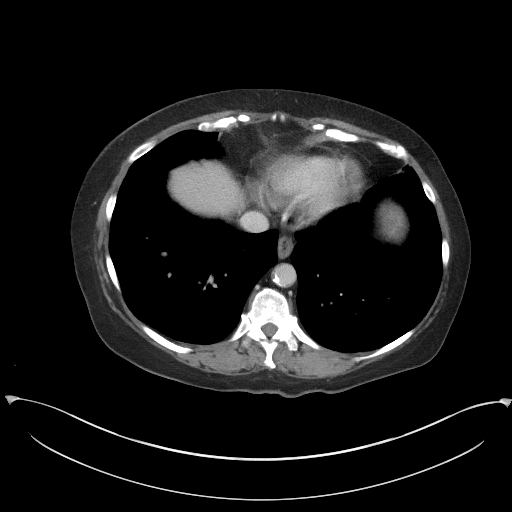

[Series 5: coronal st · coronal · 0.76mm/px · 3 of 105 slices shown]
[im 35/105  soft-tissue]
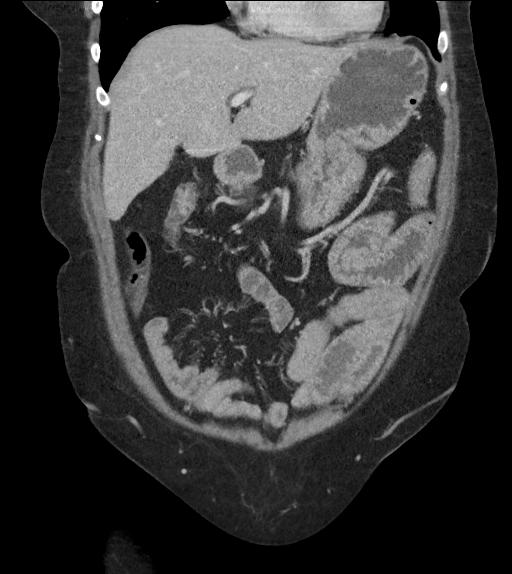
[im 47/105  soft-tissue]
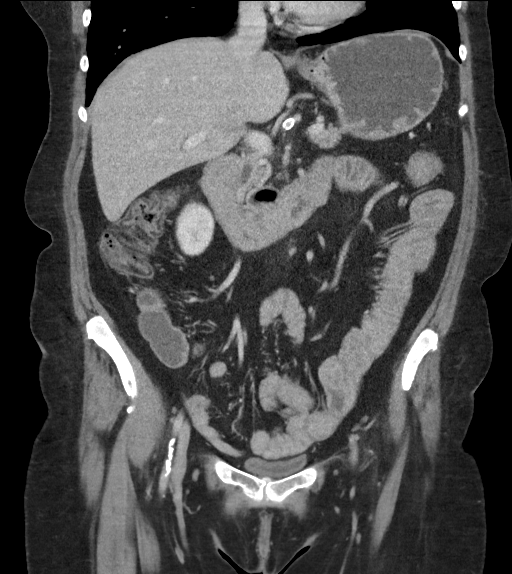
[im 58/105  soft-tissue]
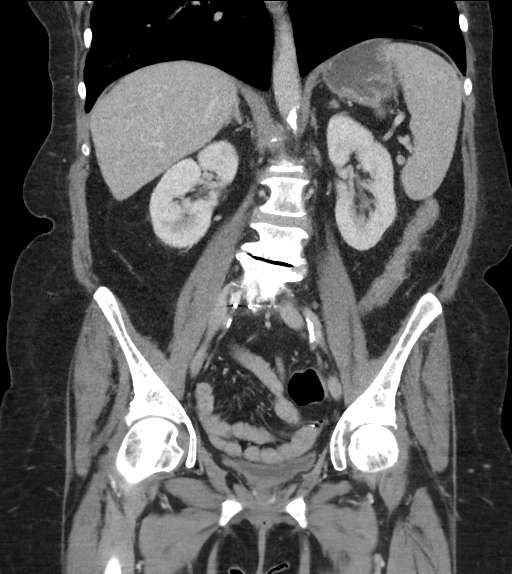

[16 of 46 positions shown; findings below may reference images not displayed]

FINDINGS: Lower chest: No acute abnormality.

Hepatobiliary: No focal liver abnormality is seen. Status post
cholecystectomy. No biliary dilatation.

Pancreas: Unremarkable. No pancreatic ductal dilatation or
surrounding inflammatory changes.

Spleen: Normal in size without focal abnormality.

Adrenals/Urinary Tract: Adrenal glands are within normal limits.
Kidneys demonstrate a normal enhancement pattern bilaterally. Normal
excretion is seen bilaterally. The bladder is decompressed.

Stomach/Bowel: Postsurgical changes are noted in the sigmoid colon.
The more proximal colon is decompressed. No inflammatory or
obstructive changes are seen. The appendix is not well visualized.
No inflammatory changes are seen. The stomach is decompressed. Mild
wall thickening is noted in the proximal jejunum which may represent
some mild enteritis. No true obstructive changes are seen.

Vascular/Lymphatic: Aortic atherosclerosis. No enlarged abdominal or
pelvic lymph nodes.

Reproductive: Uterus and bilateral adnexa are unremarkable. Changes
of tubal ligation are noted.

Other: No abdominal wall hernia or abnormality. No abdominopelvic
ascites.

Musculoskeletal: Degenerative and postoperative changes of lumbar
spine are noted.
IMPRESSION: Mild prominence of the proximal jejunal wall which may represent
some mild enteritis. No obstructive changes are seen.

No other focal abnormality is noted.

## 2021-05-16 HISTORY — PX: OTHER SURGICAL HISTORY: SHX169

## 2021-05-19 ENCOUNTER — Other Ambulatory Visit: Payer: Self-pay | Admitting: "Endocrinology

## 2021-05-19 DIAGNOSIS — E1165 Type 2 diabetes mellitus with hyperglycemia: Secondary | ICD-10-CM

## 2021-05-20 ENCOUNTER — Other Ambulatory Visit: Payer: Self-pay | Admitting: "Endocrinology

## 2021-05-20 DIAGNOSIS — E1165 Type 2 diabetes mellitus with hyperglycemia: Secondary | ICD-10-CM

## 2021-05-21 ENCOUNTER — Other Ambulatory Visit: Payer: Self-pay | Admitting: "Endocrinology

## 2021-05-21 DIAGNOSIS — E1165 Type 2 diabetes mellitus with hyperglycemia: Secondary | ICD-10-CM

## 2021-05-21 NOTE — Telephone Encounter (Signed)
Pt is requesting refill for Metformin. She did labs 04-09-21 and made appt.

## 2021-05-21 NOTE — Telephone Encounter (Signed)
error 

## 2021-05-26 ENCOUNTER — Ambulatory Visit: Payer: Federal, State, Local not specified - PPO | Admitting: Nurse Practitioner

## 2021-06-10 ENCOUNTER — Emergency Department (HOSPITAL_COMMUNITY): Payer: Medicare Other

## 2021-06-10 ENCOUNTER — Encounter (HOSPITAL_COMMUNITY): Payer: Self-pay

## 2021-06-10 ENCOUNTER — Emergency Department (HOSPITAL_COMMUNITY)
Admission: EM | Admit: 2021-06-10 | Discharge: 2021-06-11 | Disposition: A | Discharge status: Home / Self Care | Payer: Medicare Other | Attending: Emergency Medicine | Admitting: Emergency Medicine

## 2021-06-10 DIAGNOSIS — F17210 Nicotine dependence, cigarettes, uncomplicated: Secondary | ICD-10-CM | POA: Diagnosis not present

## 2021-06-10 DIAGNOSIS — Z79899 Other long term (current) drug therapy: Secondary | ICD-10-CM | POA: Diagnosis not present

## 2021-06-10 DIAGNOSIS — E119 Type 2 diabetes mellitus without complications: Secondary | ICD-10-CM | POA: Diagnosis not present

## 2021-06-10 DIAGNOSIS — R600 Localized edema: Secondary | ICD-10-CM | POA: Insufficient documentation

## 2021-06-10 DIAGNOSIS — Z794 Long term (current) use of insulin: Secondary | ICD-10-CM | POA: Insufficient documentation

## 2021-06-10 DIAGNOSIS — Z7982 Long term (current) use of aspirin: Secondary | ICD-10-CM | POA: Diagnosis not present

## 2021-06-10 DIAGNOSIS — I10 Essential (primary) hypertension: Secondary | ICD-10-CM | POA: Diagnosis not present

## 2021-06-10 DIAGNOSIS — J449 Chronic obstructive pulmonary disease, unspecified: Secondary | ICD-10-CM | POA: Insufficient documentation

## 2021-06-10 DIAGNOSIS — Z7984 Long term (current) use of oral hypoglycemic drugs: Secondary | ICD-10-CM | POA: Insufficient documentation

## 2021-06-10 DIAGNOSIS — R609 Edema, unspecified: Secondary | ICD-10-CM

## 2021-06-10 DIAGNOSIS — M7989 Other specified soft tissue disorders: Secondary | ICD-10-CM | POA: Diagnosis present

## 2021-06-10 LAB — CBC WITH DIFFERENTIAL/PLATELET
Abs Immature Granulocytes: 0.03 K/uL (ref 0.00–0.07)
Basophils Absolute: 0.1 K/uL (ref 0.0–0.1)
Basophils Relative: 2 %
Eosinophils Absolute: 0.4 K/uL (ref 0.0–0.5)
Eosinophils Relative: 4 %
HCT: 29.6 % — ABNORMAL LOW (ref 36.0–46.0)
Hemoglobin: 8 g/dL — ABNORMAL LOW (ref 12.0–15.0)
Immature Granulocytes: 0 %
Lymphocytes Relative: 34 %
Lymphs Abs: 2.7 K/uL (ref 0.7–4.0)
MCH: 18.6 pg — ABNORMAL LOW (ref 26.0–34.0)
MCHC: 27 g/dL — ABNORMAL LOW (ref 30.0–36.0)
MCV: 68.8 fL — ABNORMAL LOW (ref 80.0–100.0)
Monocytes Absolute: 0.6 K/uL (ref 0.1–1.0)
Monocytes Relative: 8 %
Neutro Abs: 4.2 K/uL (ref 1.7–7.7)
Neutrophils Relative %: 52 %
Platelets: 322 K/uL (ref 150–400)
RBC: 4.3 MIL/uL (ref 3.87–5.11)
RDW: 24.4 % — ABNORMAL HIGH (ref 11.5–15.5)
WBC: 8 K/uL (ref 4.0–10.5)
nRBC: 0 % (ref 0.0–0.2)

## 2021-06-10 LAB — COMPREHENSIVE METABOLIC PANEL
ALT: 12 U/L (ref 0–44)
AST: 22 U/L (ref 15–41)
Albumin: 1.9 g/dL — ABNORMAL LOW (ref 3.5–5.0)
Alkaline Phosphatase: 87 U/L (ref 38–126)
Anion gap: 7 (ref 5–15)
BUN: <5 mg/dL — ABNORMAL LOW (ref 8–23)
CO2: 24 mmol/L (ref 22–32)
Calcium: 7.6 mg/dL — ABNORMAL LOW (ref 8.9–10.3)
Chloride: 99 mmol/L (ref 98–111)
Creatinine, Ser: 0.39 mg/dL — ABNORMAL LOW (ref 0.44–1.00)
GFR, Estimated: >60 mL/min (ref >60)
Glucose, Bld: 202 mg/dL — ABNORMAL HIGH (ref 70–99)
Potassium: 3.6 mmol/L (ref 3.5–5.1)
Sodium: 130 mmol/L — ABNORMAL LOW (ref 135–145)
Total Bilirubin: 0.4 mg/dL (ref 0.3–1.2)
Total Protein: 6.6 g/dL (ref 6.5–8.1)

## 2021-06-10 MED ORDER — IOHEXOL 350 MG/ML SOLN
125.0000 mL | Freq: Once | INTRAVENOUS | Status: AC | PRN
  Administered 2021-06-10: 125 mL via INTRAVENOUS

## 2021-06-10 MED ORDER — OXYCODONE-ACETAMINOPHEN 5-325 MG PO TABS
1.0000 | ORAL_TABLET | Freq: Once | ORAL | Status: AC
  Administered 2021-06-10: 1 via ORAL
  Filled 2021-06-10: qty 1

## 2021-06-10 NOTE — ED Provider Notes (Signed)
[facility] EMERGENCY DEPARTMENT Provider Note   CSN: [id number] Arrival date & time: 06/10/21  1740     History Chief Complaint  Patient presents with   Leg Swelling    Dana Dominguez is a 65 y.o. female.  Patient complains of swelling in her left lower leg.  She recently had a procedure to evaluate her stent in her abdomen where they went up her right groin  The history is provided by the patient. No language interpreter was used.  Leg Pain Location:  Leg Time since incident:  7 days Injury: no   Leg location:  L leg Pain details:    Quality:  Aching   Radiates to:  Does not radiate   Severity:  Moderate   Onset quality:  Sudden   Timing:  Constant   Progression:  Worsening Chronicity:  New Dislocation: no   Foreign body present:  No foreign bodies Associated symptoms: no back pain and no fatigue       Past Medical History:  Diagnosis Date   Back pain    COPD (chronic obstructive pulmonary disease) (HCC)    Diabetes (HCC)    Dysphagia 09/29/2016   GERD (gastroesophageal reflux disease)    Heart murmur    High cholesterol    On supplemental oxygen therapy    3L [place]  at night    Patient Active Problem List   Diagnosis Date Noted   COPD (chronic obstructive pulmonary disease) (HCC) 01/22/2021   Symptomatic anemia 01/22/2021   Acute respiratory failure with hypoxia (HCC) 01/21/2021   GERD (gastroesophageal reflux disease)    Uncontrolled type 2 diabetes mellitus with hyperglycemia (HCC) 04/26/2018   Mixed hyperlipidemia 04/26/2018   Current smoker 04/26/2018   Essential hypertension, benign 04/26/2018   Dysphagia 09/29/2016   Diabetes (HCC) 10/23/2014   Carotid stenosis 10/23/2014    Past Surgical History:  Procedure Laterality Date   BACK SURGERY     x 2 in the past   carotid surgery     December 2015 rt    CHOLECYSTECTOMY     [facility]   COLON SURGERY     for diverticulitis   ESOPHAGEAL DILATION N/A 11/21/2014   Procedure: ESOPHAGEAL DILATION;   Surgeon: Malissa Hippo, MD;  Location: [facility];  Service: Endoscopy;  Laterality: N/A;   ESOPHAGOGASTRODUODENOSCOPY N/A 11/21/2014   Procedure: ESOPHAGOGASTRODUODENOSCOPY (EGD);  Surgeon: Malissa Hippo, MD;  Location: [facility];  Service: Endoscopy;  Laterality: N/A;  1200 - moved to 4/7 @ 9:00   NASAL SINUS SURGERY     NECK SURGERY     stent in left arm     blockage 3 yrs ago.      OB History   No obstetric history on file.     Family History  Problem Relation Age of Onset   Cancer - Colon Neg Hx     Social History   Tobacco Use   Smoking status: Every Day    Packs/day: 2.00    Years: 50.00    Pack years: 100.00    Types: Cigarettes   Smokeless tobacco: Never   Tobacco comments:    1 pack day since 12 yrs  Vaping Use   Vaping Use: Never used  Substance Use Topics   Alcohol use: No    Alcohol/week: 0.0 standard drinks   Drug use: No    Home Medications Prior to Admission medications   Medication Sig Start Date End Date Taking? Authorizing Provider  ondansetron [facility]) 4  MG tablet Take by mouth. 06/05/21 06/12/21 Yes [provider]  ANORO ELLIPTA 62.5-25 MCG/INH AEPB Inhale 1 puff into the lungs daily. 12/18/20   [provider]  ARIPiprazole (ABILIFY) 5 MG tablet Take 5 mg by mouth daily.    [provider]  aspirin EC 81 MG tablet Take 81 mg by mouth daily.    [provider]  atorvastatin (LIPITOR) 80 MG tablet Take 80 mg by mouth daily.     [provider]  B-D ULTRAFINE III SHORT PEN 31G X 8 MM MISC SMARTSIG:1 Each SUB-Q Daily 06/17/20   Dani Gobble, NP  BD INSULIN SYRINGE U/F 31G X 5/16" 0.3 ML MISC 1 each by Other route at bedtime. 06/16/20   Roma Kayser, MD  busPIRone (BUSPAR) 15 MG tablet Take 15 mg by mouth 3 (three) times daily.    [provider]  ciprofloxacin (CIPRO) 500 MG tablet Take 1 tablet (500 mg total) by mouth every 12 (twelve) hours. 03/25/21   Jacalyn Lefevre, MD   clopidogrel (PLAVIX) 75 MG tablet Take 75 mg by mouth daily. 05/05/21   [provider]  Continuous Blood Gluc Sensor (FREESTYLE LIBRE 14 DAY SENSOR) MISC INJECT 1 SENSOR INTO THE SKIN EVERY 14 DAYS AS DIRECTED 05/21/21   Dani Gobble, NP  desloratadine (CLARINEX) 5 MG tablet Take 5 mg by mouth daily.    [provider]  dexlansoprazole (DEXILANT) 60 MG capsule Take 60 mg by mouth daily.    [provider]  dicyclomine (BENTYL) 10 MG capsule Take 10 mg by mouth 2 (two) times daily.    [provider]  Docusate Sodium (DSS) 100 MG CAPS Take 1 capsule by mouth daily. 05/25/17   [provider]  DULoxetine (CYMBALTA) 60 MG capsule Take 60 mg by mouth 2 (two) times daily.    [provider]  glipiZIDE (GLUCOTROL XL) 5 MG 24 hr tablet TAKE 1 TABLET BY MOUTH EVERY DAY WITH BREAKFAST. NEEDS APPT FOR MORE REFILLS. 05/22/21   Roma Kayser, MD  HYDROcodone-acetaminophen (NORCO/VICODIN) 5-325 MG tablet Take 1 tablet by mouth every 4 (four) hours as needed. 03/25/21   Jacalyn Lefevre, MD  hydroxychloroquine (PLAQUENIL) 200 MG tablet Take 200 mg by mouth daily. 09/01/20   [provider]  hydrOXYzine (ATARAX/VISTARIL) 50 MG tablet Take 50 mg by mouth See admin instructions. Take 1 tablet by mouth twice daily; may also take twice more as needed for anxiety.    [provider]  hydrOXYzine (VISTARIL) 50 MG capsule Take 50 mg by mouth 2 (two) times daily as needed. 04/15/21   [provider]  Insulin Glargine (BASAGLAR KWIKPEN) 100 UNIT/ML Inject 45-50 Units into the skin at bedtime. Sliding scale 01/22/21   Cleora Fleet, MD  Iron, Ferrous Sulfate, 325 (65 Fe) MG TABS Take 1 tablet by mouth daily with breakfast. Patient not taking: No sig reported 01/22/21   Laural Benes, Clanford L, MD  metFORMIN (GLUCOPHAGE-XR) 500 MG 24 hr tablet TAKE 2 TABLETS BY MOUTH ONCE DAILY WITH BREAKFAST 05/21/21   Dani Gobble, NP   metroNIDAZOLE (FLAGYL) 500 MG tablet Take 1 tablet (500 mg total) by mouth 2 (two) times daily. 03/25/21   Jacalyn Lefevre, MD  ondansetron (ZOFRAN ODT) 4 MG disintegrating tablet Take 1 tablet (4 mg total) by mouth every 8 (eight) hours as needed for nausea or vomiting. 03/25/21   Jacalyn Lefevre, MD  ondansetron (ZOFRAN) 4 MG tablet Take 1 tablet (4 mg total) by  mouth every 6 (six) hours. 01/12/20   Carroll Sage, PA-C  Probiotic Product (PROBIOTIC & ACIDOPHILUS EX ST) CAPS Take 1 capsule by mouth daily. 03/25/21   Jacalyn Lefevre, MD  traZODone (DESYREL) 100 MG tablet Take 200 mg by mouth at bedtime.  10/22/14   [provider]    Allergies    Ticagrelor, Glipizide, Codeine, Plavix [clopidogrel bisulfate], Protonix [pantoprazole sodium], and Sporanox [itraconazole]  Review of Systems   Review of Systems  Constitutional:  Negative for appetite change and fatigue.  HENT:  Negative for congestion, ear discharge and sinus pressure.   Eyes:  Negative for discharge.  Respiratory:  Negative for cough.   Cardiovascular:  Negative for chest pain.  Gastrointestinal:  Negative for abdominal pain and diarrhea.  Genitourinary:  Negative for frequency and hematuria.  Musculoskeletal:  Negative for back pain.       Left leg pain  Skin:  Negative for rash.  Neurological:  Negative for seizures and headaches.  Psychiatric/Behavioral:  Negative for hallucinations.    Physical Exam Updated Vital Signs BP 112/80   Pulse (!) 102   Temp 98.3 F (36.8 C) (Oral)   Resp 13   Ht 5\' 3"  (1.6 m)   Wt 51.7 kg   SpO2 96%   BMI 20.19 kg/m   Physical Exam Vitals and nursing note reviewed.  Constitutional:      Appearance: She is well-developed.  HENT:     Head: Normocephalic.  Eyes:     General: No scleral icterus.    Conjunctiva/sclera: Conjunctivae normal.  Neck:     Thyroid: No thyromegaly.  Cardiovascular:     Rate and Rhythm: Normal rate and regular rhythm.     Heart sounds: No  murmur heard.   No friction rub. No gallop.  Pulmonary:     Breath sounds: No stridor. No wheezing or rales.  Chest:     Chest wall: No tenderness.  Abdominal:     General: There is no distension.     Tenderness: There is no abdominal tenderness. There is no rebound.  Musculoskeletal:     Cervical back: Neck supple.     Comments: Mild swelling left leg.  Femoral pulse 2+ good cap refill  Lymphadenopathy:     Cervical: No cervical adenopathy.  Skin:    Findings: No erythema or rash.  Neurological:     Mental Status: She is alert and oriented to person, place, and time.     Motor: No abnormal muscle tone.     Coordination: Coordination normal.  Psychiatric:        Behavior: Behavior normal.    ED Results / Procedures / Treatments   Labs (all labs ordered are listed, but only abnormal results are displayed) Labs Reviewed  CBC WITH DIFFERENTIAL/PLATELET - Abnormal; Notable for the following components:      Result Value   Hemoglobin 8.0 (*)    HCT 29.6 (*)    MCV 68.8 (*)    MCH 18.6 (*)    MCHC 27.0 (*)    RDW 24.4 (*)    All other components within normal limits  COMPREHENSIVE METABOLIC PANEL - Abnormal; Notable for the following components:   Sodium 130 (*)    Glucose, Bld 202 (*)    BUN <5 (*)    Creatinine, Ser 0.39 (*)    Calcium 7.6 (*)    Albumin 1.9 (*)    All other components within normal limits    EKG None  Radiology CT  ANGIO AO+BIFEM W & OR WO CONTRAST  Result Date: 06/10/2021 CLINICAL DATA:  Claudication or leg ischemia. EXAM: CT ANGIOGRAPHY OF ABDOMINAL AORTA WITH ILIOFEMORAL RUNOFF TECHNIQUE: Multidetector CT imaging of the abdomen, pelvis and lower extremities was performed using the standard protocol during bolus administration of intravenous contrast. Multiplanar CT image reconstructions and MIPs were obtained to evaluate the vascular anatomy. CONTRAST:  OMNIPAQUE IOHEXOL 350 MG/ML SOLN COMPARISON:  CT abdomen pelvis dated 03/25/2021.  FINDINGS: VASCULAR Aorta: There is advanced atherosclerotic calcification of the abdominal aorta. No aneurysmal dilatation or dissection. No periaortic fluid collection. A stent is noted in the distal aorta extending into the common iliac arteries. Celiac: There is advanced atherosclerotic calcification of the celiac axis. There is a stent in the celiac trunk. The celiac artery and its major branches appear patent. SMA: Atherosclerotic calcification of the origin of the SMA. 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 Renals: Advanced atherosclerotic calcification of the origins of the renal arteries. The renal arteries remain patent. IMA: There is focal area of narrowing of the origin of the IMA. The IMA remains patent. RIGHT Lower Extremity Inflow: Advanced atherosclerotic calcification of the iliac arteries. The iliac arteries remain patent. Outflow: There is atherosclerotic calcification of the SFA. The common femoral artery, superficial and deep femoral arteries are patent. The popliteal arteries patent. Runoff: The anterior and posterior tibial artery as well as the fibular artery is patent to the level of the ankle. The dorsalis pedis and plantar artery are patent. LEFT Lower Extremity Inflow: There is atherosclerotic calcification of the iliac arteries. The iliac arteries remain patent. Outflow: Mild atherosclerotic calcification of the femoral arteries. The common femoral, superficial and femoral arteries, and the popliteal artery are patent. Runoff: The anterior and posterior tibial arteries are patent to the level of the ankle. The dorsalis pedis and plantar arteries appear patent. The fibular artery remains patent. Veins: No obvious venous abnormality within the limitations of this arterial phase study. Review of the MIP images confirms the above findings. NON-VASCULAR Lower chest: The visualized lung bases are clear. There is coronary  vascular calcification. No intra-abdominal free air or free fluid. Hepatobiliary: The liver is unremarkable. No intrahepatic biliary ductal dilatation. Cholecystectomy. Pancreas: Unremarkable. No pancreatic ductal dilatation or surrounding inflammatory changes. Spleen: Normal in size without focal abnormality. Adrenals/Urinary Tract: The adrenal glands are unremarkable. The kidneys, visualized ureters, and urinary bladder are unremarkable. Stomach/Bowel: Moderate stool throughout the colon. There is an apparent slight thickened appearance of the midportion of the ascending colon (54/4 and coronal 66/6) concerning for focal colitis. Clinical correlation is recommended. No bowel obstruction. Lymphatic: No adenopathy. Reproductive: The uterus is poorly visualized but grossly unremarkable. No adnexal masses. Bilateral tubal ligation clips. Other: None Musculoskeletal: Osteopenia with degenerative changes of the spine. No acute osseous pathology. IMPRESSION: 1. Advanced atherosclerotic calcification of the aorta and its major branches. 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 There is a stent in the celiac trunk. 2. Patent major arteries of the bilateral lower extremities. 3. Patent bilateral three-vessel runoff. 4. Apparent slight thickened appearance of the midportion of the ascending colon concerning for focal colitis. Early ischemia is not excluded. Clinical correlation is recommended. No bowel obstruction. Electronically Signed   By: Elgie Collard M.D.   On: 06/10/2021 22:37    Procedures Procedures   Medications Ordered in ED Medications  oxyCODONE-acetaminophen (PERCOCET/ROXICET)  5-325 MG per tablet 1 tablet (1 tablet Oral Given 06/10/21 2122)  iohexol (OMNIPAQUE) 350 MG/ML injection 125 mL (125 mLs Intravenous Contrast Given 06/10/21 2202)    ED Course  I have reviewed the triage vital signs and the nursing notes.  Pertinent labs  & imaging results that were available during my care of the patient were reviewed by me and considered in my medical decision making (see chart for details).  CT scan did not show significant occlusive disease in her leg.  Patient may have a DVT she is going to get ultrasound tomorrow MDM Rules/Calculators/A&P                           Patient with swelling to left leg.  She will get DVT study tomorrow Final Clinical Impression(s) / ED Diagnoses Final diagnoses:  Peripheral edema  Leg edema    Rx / DC Orders ED Discharge Orders     None        Bethann Berkshire, MD 06/13/21 1216

## 2021-06-10 NOTE — Discharge Instructions (Signed)
Follow-up tomorrow to get an ultrasound of your leg.  Keep your leg elevated.

## 2021-06-10 NOTE — ED Triage Notes (Signed)
Pt. Arrived via pov with complaints of swelling to their left leg and left foot. Pt. States they had stent opened up in their stomach on Friday. Pt. Was advised to come to ED to assess for a possible blood clot.

## 2021-06-10 NOTE — ED Provider Notes (Signed)
Emergency Medicine Provider Triage Evaluation Note  Dana Dominguez , a 65 y.o. female  was evaluated in triage.  Pt complains of left leg pain and swelling over the last day.  She recently had a catheterization and stent placement in the mesenteric arteries on 06/05/21. Was sent to rule out DVT. No chest pain or shortness of breath.   Review of Systems  Positive:  Negative: See above   Physical Exam  BP (!) 104/52 (BP Location: Right Arm)   Pulse (!) 103   Temp 98.3 F (36.8 C) (Oral)   Resp 18   Ht 5\' 3"  (1.6 m)   Wt 51.7 kg   SpO2 98%   BMI 20.19 kg/m  Gen:   Awake, no distress   Resp:  Normal effort  MSK:   Moves extremities without difficulty  Other:  2+ pitting edema in the left foot and ankle. 2+ dorsalis pedis pulse in the left foot.   Medical Decision Making  Medically screening exam initiated at 6:51 PM.  Appropriate orders placed.  Dana Dominguez was informed that the remainder of the evaluation will be completed by another provider, this initial triage assessment does not replace that evaluation, and the importance of remaining in the ED until their evaluation is complete.     Dana Gauze [place], PA-C 06/10/21 06/12/21    Dana Smoke, MD 06/13/21 [phone]

## 2021-06-10 NOTE — ED Notes (Signed)
Pt wheeled to restroom. Instructed to get sample.

## 2021-06-11 ENCOUNTER — Ambulatory Visit: Payer: Federal, State, Local not specified - PPO | Admitting: Nurse Practitioner

## 2021-06-11 NOTE — ED Notes (Signed)
Pt unable to sign at discharge; signature pad not working

## 2021-06-24 NOTE — Patient Instructions (Incomplete)

## 2021-06-25 ENCOUNTER — Ambulatory Visit: Payer: Medicare Other | Admitting: Nurse Practitioner

## 2021-06-25 DIAGNOSIS — E559 Vitamin D deficiency, unspecified: Secondary | ICD-10-CM

## 2021-06-25 DIAGNOSIS — E782 Mixed hyperlipidemia: Secondary | ICD-10-CM

## 2021-06-25 DIAGNOSIS — I10 Essential (primary) hypertension: Secondary | ICD-10-CM

## 2021-06-25 DIAGNOSIS — F17200 Nicotine dependence, unspecified, uncomplicated: Secondary | ICD-10-CM

## 2021-06-25 DIAGNOSIS — E1165 Type 2 diabetes mellitus with hyperglycemia: Secondary | ICD-10-CM

## 2021-07-01 ENCOUNTER — Other Ambulatory Visit: Payer: Self-pay | Admitting: Nurse Practitioner

## 2021-07-01 ENCOUNTER — Other Ambulatory Visit: Payer: Self-pay | Admitting: "Endocrinology

## 2021-07-01 DIAGNOSIS — E1165 Type 2 diabetes mellitus with hyperglycemia: Secondary | ICD-10-CM

## 2021-07-01 NOTE — Telephone Encounter (Signed)
This patient has a history of cancelling every appointment after she has received a refill of sensors or medications. Please advise if okay to send 2 sensors to pharmacy or if she needs to move her appointment to an earlier date.

## 2021-07-28 ENCOUNTER — Encounter: Payer: Self-pay | Admitting: Nurse Practitioner

## 2021-07-28 ENCOUNTER — Ambulatory Visit (INDEPENDENT_AMBULATORY_CARE_PROVIDER_SITE_OTHER): Payer: Medicare Other | Admitting: Nurse Practitioner

## 2021-07-28 DIAGNOSIS — Z716 Tobacco abuse counseling: Secondary | ICD-10-CM | POA: Diagnosis not present

## 2021-07-28 DIAGNOSIS — I10 Essential (primary) hypertension: Secondary | ICD-10-CM

## 2021-07-28 DIAGNOSIS — E1142 Type 2 diabetes mellitus with diabetic polyneuropathy: Secondary | ICD-10-CM | POA: Diagnosis not present

## 2021-07-28 DIAGNOSIS — E559 Vitamin D deficiency, unspecified: Secondary | ICD-10-CM

## 2021-07-28 DIAGNOSIS — F17200 Nicotine dependence, unspecified, uncomplicated: Secondary | ICD-10-CM

## 2021-07-28 DIAGNOSIS — Z7984 Long term (current) use of oral hypoglycemic drugs: Secondary | ICD-10-CM

## 2021-07-28 DIAGNOSIS — E1165 Type 2 diabetes mellitus with hyperglycemia: Secondary | ICD-10-CM

## 2021-07-28 DIAGNOSIS — Z6822 Body mass index (BMI) 22.0-22.9, adult: Secondary | ICD-10-CM

## 2021-07-28 DIAGNOSIS — E785 Hyperlipidemia, unspecified: Secondary | ICD-10-CM

## 2021-07-28 DIAGNOSIS — E663 Overweight: Secondary | ICD-10-CM

## 2021-07-28 LAB — POCT GLYCOSYLATED HEMOGLOBIN (HGB A1C): HbA1c, POC (controlled diabetic range): 7.4 % — AB (ref 0.0–7.0)

## 2021-07-28 MED ORDER — VITAMIN D (ERGOCALCIFEROL) 1.25 MG (50000 UNIT) PO CAPS: 50000.0000 [IU] | ORAL_CAPSULE | ORAL | 3 refills | Status: AC

## 2021-07-28 MED ORDER — METFORMIN HCL ER 500 MG PO TB24: ORAL_TABLET | ORAL | 3 refills | Status: DC

## 2021-07-28 MED ORDER — GLIPIZIDE ER 5 MG PO TB24: ORAL_TABLET | ORAL | 3 refills | Status: DC

## 2021-07-28 MED ORDER — BASAGLAR KWIKPEN 100 UNIT/ML ~~LOC~~ SOPN: 40.0000 [IU] | PEN_INJECTOR | Freq: Every day | SUBCUTANEOUS | 3 refills | Status: DC

## 2021-07-28 NOTE — Progress Notes (Signed)
07/28/2021, 2:03 PM   Endocrinology follow-up note   Subjective:    Patient ID: Dana Dominguez, female    DOB: 18-May-1956.  Dana Dominguez is being seen in follow-up for management of currently uncontrolled type 2 diabetes, hyperlipidemia, hypertension. PCP:   Joya Salm.   Past Medical History:  Diagnosis Date   Back pain    COPD (chronic obstructive pulmonary disease) (HCC)    Diabetes (HCC)    Dysphagia 09/29/2016   GERD (gastroesophageal reflux disease)    Heart murmur    High cholesterol    On supplemental oxygen therapy    3L [place]  at night   Past Surgical History:  Procedure Laterality Date   BACK SURGERY     x 2 in the past   carotid surgery     December 2015 rt    CHOLECYSTECTOMY     [facility]   COLON SURGERY     for diverticulitis   ESOPHAGEAL DILATION N/A 11/21/2014   Procedure: ESOPHAGEAL DILATION;  Surgeon: Malissa Hippo, MD;  Location: [facility];  Service: Endoscopy;  Laterality: N/A;   ESOPHAGOGASTRODUODENOSCOPY N/A 11/21/2014   Procedure: ESOPHAGOGASTRODUODENOSCOPY (EGD);  Surgeon: Malissa Hippo, MD;  Location: [facility];  Service: Endoscopy;  Laterality: N/A;  1200 - moved to 4/7 @ 9:00   NASAL SINUS SURGERY     NECK SURGERY     stent in left arm     blockage 3 yrs ago.    Social History   Socioeconomic History   Marital status: Married    Spouse name: Not on file   Number of children: Not on file   Years of education: Not on file   Highest education level: Not on file  Occupational History   Not on file  Tobacco Use   Smoking status: Every Day    Packs/day: 2.00    Years: 50.00    Pack years: 100.00    Types: Cigarettes   Smokeless tobacco: Never   Tobacco comments:    1 pack day since 12 yrs  Vaping Use   Vaping Use: Never used  Substance and Sexual Activity   Alcohol use: No    Alcohol/week: 0.0 standard drinks   Drug use: No   Sexual activity: Not on file  Other Topics Concern   Not on file   Social History Narrative   Not on file   Social Determinants of Health   Financial Resource Strain: Not on file  Food Insecurity: Not on file  Transportation Needs: Not on file  Physical Activity: Not on file  Stress: Not on file  Social Connections: Not on file   Outpatient Encounter Medications as of 07/28/2021  Medication Sig   ANORO ELLIPTA 62.5-25 MCG/INH AEPB Inhale 1 puff into the lungs daily.   ARIPiprazole (ABILIFY) 5 MG tablet Take 5 mg by mouth daily.   aspirin EC 81 MG tablet Take 81 mg by mouth daily.   atorvastatin (LIPITOR) 80 MG tablet Take 80 mg by mouth daily.    B-D ULTRAFINE III SHORT PEN 31G X 8 MM MISC SMARTSIG:1 Each SUB-Q Daily   BD INSULIN SYRINGE U/F 31G X 5/16" 0.3 ML MISC 1 each by Other route at bedtime.   buPROPion (WELLBUTRIN XL) 150 MG 24 hr tablet Take by mouth.   busPIRone (BUSPAR) 15 MG tablet Take 15 mg by mouth 3 (three) times daily.   clopidogrel (PLAVIX) 75 MG tablet Take 75 mg by  mouth daily.   Continuous Blood Gluc Sensor (FREESTYLE LIBRE 14 DAY SENSOR) MISC INJECT 1 SENSOR INTO THE SKIN EVERY 14 DAYS AS DIRECTED   desloratadine (CLARINEX) 5 MG tablet Take 5 mg by mouth daily.   dexlansoprazole (DEXILANT) 60 MG capsule Take 60 mg by mouth daily.   dicyclomine (BENTYL) 10 MG capsule Take 10 mg by mouth 2 (two) times daily.   Docusate Sodium (DSS) 100 MG CAPS Take 1 capsule by mouth daily.   DULoxetine (CYMBALTA) 60 MG capsule Take 60 mg by mouth 2 (two) times daily.   HYDROcodone-acetaminophen (NORCO/VICODIN) 5-325 MG tablet Take 1 tablet by mouth every 4 (four) hours as needed.   hydroxychloroquine (PLAQUENIL) 200 MG tablet Take 200 mg by mouth daily.   hydrOXYzine (ATARAX/VISTARIL) 50 MG tablet Take 50 mg by mouth See admin instructions. Take 1 tablet by mouth twice daily; may also take twice more as needed for anxiety.   hydrOXYzine (VISTARIL) 50 MG capsule Take 50 mg by mouth 2 (two) times daily as needed.   ondansetron (ZOFRAN ODT) 4 MG  disintegrating tablet Take 1 tablet (4 mg total) by mouth every 8 (eight) hours as needed for nausea or vomiting.   ondansetron (ZOFRAN) 4 MG tablet Take 1 tablet (4 mg total) by mouth every 6 (six) hours.   Probiotic Product (PROBIOTIC & ACIDOPHILUS EX ST) CAPS Take 1 capsule by mouth daily.   traZODone (DESYREL) 100 MG tablet Take 200 mg by mouth at bedtime.    Vitamin D, Ergocalciferol, (DRISDOL) 1.25 MG (50000 UNIT) CAPS capsule Take 1 capsule (50,000 Units total) by mouth every 7 (seven) days.   [DISCONTINUED] glipiZIDE (GLUCOTROL XL) 5 MG 24 hr tablet TAKE 1 TABLET BY MOUTH EVERY DAY WITH BREAKFAST. NEEDS APPT FOR MORE REFILLS.   [DISCONTINUED] Insulin Glargine (BASAGLAR KWIKPEN) 100 UNIT/ML Inject 45-50 Units into the skin at bedtime. Sliding scale   [DISCONTINUED] metFORMIN (GLUCOPHAGE-XR) 500 MG 24 hr tablet TAKE 2 TABLETS BY MOUTH ONCE DAILY WITH BREAKFAST   glipiZIDE (GLUCOTROL XL) 5 MG 24 hr tablet TAKE 1 TABLET BY MOUTH EVERY DAY WITH BREAKFAST. NEEDS APPT FOR MORE REFILLS.   Insulin Glargine (BASAGLAR KWIKPEN) 100 UNIT/ML Inject 40 Units into the skin at bedtime. Sliding scale   metFORMIN (GLUCOPHAGE-XR) 500 MG 24 hr tablet TAKE 2 TABLETS BY MOUTH ONCE DAILY WITH BREAKFAST   [DISCONTINUED] ciprofloxacin (CIPRO) 500 MG tablet Take 1 tablet (500 mg total) by mouth every 12 (twelve) hours. (Patient not taking: Reported on 07/28/2021)   [DISCONTINUED] Iron, Ferrous Sulfate, 325 (65 Fe) MG TABS Take 1 tablet by mouth daily with breakfast. (Patient not taking: Reported on 03/25/2021)   [DISCONTINUED] metroNIDAZOLE (FLAGYL) 500 MG tablet Take 1 tablet (500 mg total) by mouth 2 (two) times daily. (Patient not taking: Reported on 07/28/2021)   No facility-administered encounter medications on file as of 07/28/2021.    ALLERGIES: Allergies  Allergen Reactions   Ticagrelor Shortness Of Breath   Glipizide Nausea Only, Other (See Comments) and Rash    Cold sweats  patient can't remember  reaction   Clopidogrel Nausea Only    Other reaction(s): cold sweats   Codeine Nausea Only    Dizzy, nausea, cold sweat Other reaction(s): Rash   Pantoprazole Nausea Only    Other reaction(s): cold sweats   Plavix [Clopidogrel Bisulfate]     SOB   Protonix [Pantoprazole Sodium]     ? nausea   Itraconazole Rash    Major rash Other reaction(s): Itching  VACCINATION STATUS:  There is no immunization history on file for this patient.  Diabetes She presents for her follow-up diabetic visit. She has type 2 diabetes mellitus. Onset time: She was diagnosed at approximate age of 84. Her disease course has been stable. There are no hypoglycemic associated symptoms. Pertinent negatives for hypoglycemia include no confusion, headaches, pallor or seizures. Associated symptoms include fatigue, foot paresthesias and weakness. Pertinent negatives for diabetes include no blurred vision, no chest pain, no polydipsia, no polyphagia and no polyuria. There are no hypoglycemic complications. Symptoms are stable. Diabetic complications include nephropathy and peripheral neuropathy. (Had stents placed in legs since last visit) Risk factors for coronary artery disease include diabetes mellitus, dyslipidemia, family history, hypertension, sedentary lifestyle, tobacco exposure and post-menopausal. Current diabetic treatment includes insulin injections and oral agent (dual therapy). She is compliant with treatment most of the time. Her weight is fluctuating minimally. She is following a generally unhealthy diet. When asked about meal planning, she reported none. She has not had a previous visit with a dietitian. 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  Cannot exercise optimally.) participates in exercise. Her home blood glucose trend is fluctuating minimally. Her overall blood glucose range is 180-200 mg/dl. (She presents today with her CGM and meter showing slightly above target fasting and  postprandial glycemic profile.  Her POCT A1c today is 7.4%, improving from last visit of 7.7%.  She has been in and out of the hospital for multiple medical conditions.  Analysis of her CGM shows TIR 46%, TAR 51%, TBR 3%.  She adjusts her Basaglar dose depending on her night time glucose reading taking anywhere from 0-50 units.) An ACE inhibitor/angiotensin II receptor blocker is not being taken. She does not see a podiatrist.Eye exam is current.  Hyperlipidemia This is a chronic problem. The current episode started more than 1 year ago. The problem is controlled. Recent lipid tests were reviewed and are variable. Exacerbating diseases include chronic renal disease and diabetes. Factors aggravating her hyperlipidemia include smoking, fatty foods and beta blockers. Pertinent negatives include no chest pain, myalgias or shortness of breath. Current antihyperlipidemic treatment includes statins. The current treatment provides moderate improvement of lipids. Compliance problems include adherence to diet and adherence to exercise.  Risk factors for coronary artery disease include diabetes mellitus, dyslipidemia, family history, hypertension, a sedentary lifestyle and post-menopausal.  Hypertension This is a chronic problem. The current episode started more than 1 year ago. The problem has been gradually improving since onset. The problem is controlled. Pertinent negatives include no blurred vision, chest pain, headaches, palpitations or shortness of breath. There are no associated agents to hypertension. Risk factors for coronary artery disease include dyslipidemia, diabetes mellitus, sedentary lifestyle, smoking/tobacco exposure and family history. Past treatments include beta blockers. The current treatment provides moderate improvement. There are no compliance problems.  Hypertensive end-organ damage includes kidney disease. Identifiable causes of hypertension include chronic renal disease.   Review of  systems  Constitutional: + steadily increasing body weight (gaining back some weight previously lost unintentionally),  current Body mass index is 22.96 kg/m. , no fatigue, no subjective hyperthermia, no subjective hypothermia Eyes: no blurry vision, no xerophthalmia ENT: no sore throat, no nodules palpated in throat, no dysphagia/odynophagia, no hoarseness Cardiovascular: no chest pain, no shortness of breath, no palpitations, no leg swelling (recently had stents placed) Respiratory: no cough, no shortness of breath Gastrointestinal: no nausea/vomiting/diarrhea Musculoskeletal: no muscle/joint aches Skin: no rashes, no hyperemia Neurological: no tremors, no numbness, no  tingling, + dizziness- has fallen multiple times recently Psychiatric: no depression, no anxiety   Objective:    BP (!) 75/53    Pulse 90    Ht  (1.6 m)    Wt 129 lb 9.6 oz (58.8 kg)    BMI 22.96 kg/m   Wt Readings from Last 3 Encounters:  07/28/21 129 lb 9.6 oz (58.8 kg)  06/10/21 114 lb (51.7 kg)  03/25/21 150 lb (68 kg)    BP Readings from Last 3 Encounters:  07/28/21 (!) 75/53  06/11/21 105/75  03/25/21 121/72    Physical Exam- Limited  Constitutional:  Body mass index is 22.96 kg/m. , not in acute distress, normal state of mind Eyes:  EOMI, no exophthalmos Neck: Supple Cardiovascular: RRR, no murmurs, rubs, or gallops, no edema Respiratory: Adequate breathing efforts, no crackles, rales, rhonchi, or wheezing, chronic congested cough- smoker Musculoskeletal: no gross deformities, strength intact in all four extremities, no gross restriction of joint movements Skin:  no rashes, no hyperemia, pale Neurological: no tremor with outstretched hands   CMP     Component Value Date/Time   NA 130 (L) 06/10/2021 2102   NA 129 (L) 04/09/2021 1530   K 3.6 06/10/2021 2102   CL 99 06/10/2021 2102   CO2 24 06/10/2021 2102   GLUCOSE 202 (H) 06/10/2021 2102   BUN <5 (L) 06/10/2021 2102   BUN 6 (L)  04/09/2021 1530   CREATININE 0.39 (L) 06/10/2021 2102   CALCIUM 7.6 (L) 06/10/2021 2102   PROT 6.6 06/10/2021 2102   PROT 7.8 04/09/2021 1530   ALBUMIN 1.9 (L) 06/10/2021 2102   ALBUMIN 3.3 (L) 04/09/2021 1530   AST 22 06/10/2021 2102   ALT 12 06/10/2021 2102   ALKPHOS 87 06/10/2021 2102   BILITOT 0.4 06/10/2021 2102   BILITOT 0.3 04/09/2021 1530   GFRNONAA >60 06/10/2021 2102   GFRAA 106 10/07/2020 1330     Diabetic Labs (most recent): Lab Results  Component Value Date   HGBA1C 7.7 (H) 01/22/2021   HGBA1C 8.5 (A) 10/09/2020   HGBA1C 7.5 (A) 06/05/2020     Lipid Panel ( most recent) Lipid Panel     Component Value Date/Time   CHOL 101 04/09/2021 1530   TRIG 92 04/09/2021 1530   HDL 47 04/09/2021 1530   CHOLHDL 2.1 04/09/2021 1530   LDLCALC 36 04/09/2021 1530      Lab Results  Component Value Date   TSH 2.070 04/09/2021   TSH 1.480 01/29/2020   TSH 2.65 04/11/2019   TSH 1.30 01/30/2018   FREET4 1.45 04/09/2021     Assessment & Plan:   1) Uncontrolled type 2 diabetes mellitus with hyperglycemia (HCC)  - Dana Dominguez has currently uncontrolled symptomatic type 2 DM since 65 years of age.  She presents today with her CGM and meter showing slightly above target fasting and postprandial glycemic profile.  Her POCT A1c today is 7.4%, improving from last visit of 7.7%.  She has been in and out of the hospital for multiple medical conditions.  Analysis of her CGM shows TIR 46%, TAR 51%, TBR 3%.  She adjusts her Basaglar dose depending on her night time glucose reading taking anywhere from 0-50 units.  -Her recent labs are reviewed with her.   -her diabetes is complicated by peripheral neuropathy and she remains at extremely high risk for more acute and chronic complications which include CAD, CVA, CKD, retinopathy, and neuropathy. These are all discussed in detail with her.  -  Nutritional counseling repeated at each appointment due to patients tendency to fall back in  to old habits.  - The patient admits there is a room for improvement in their diet and drink choices. -  Suggestion is made for the patient to avoid simple carbohydrates from their diet including Cakes, Sweet Desserts / Pastries, Ice Cream, Soda (diet and regular), Sweet Tea, Candies, Chips, Cookies, Sweet Pastries, Store Bought Juices, Alcohol in Excess of 1-2 drinks a day, Artificial Sweeteners, Coffee Creamer, and "Sugar-free" Products. This will help patient to have stable blood glucose profile and potentially avoid unintended weight gain.   - I encouraged the patient to switch to unprocessed or minimally processed complex starch and increased protein intake (animal or plant source), fruits, and vegetables.   - Patient is advised to stick to a routine mealtimes to eat 3 meals a day and avoid unnecessary snacks (to snack only to correct hypoglycemia).  - I have approached her with the following individualized plan to manage diabetes and patient agrees:   -She is advised to lower her Basaglar to 50 units SQ nightly and can continue Metformin 1000 mg ER po daily with breakfast and Glipizide 5 mg XL daily with breakfast.  -She is encouraged to continue using her CGM to monitor glucose at least 4 times daily, before meals and at bedtime, and call the clinic if she has readings less than 70 or greater than 300 for 3 tests in a row.  - she is not a candidate for incretin therapy, SGLT2 inhibitors due to chronic heavy smoking (increasing her risk of pancreatitis).  - Patient specific target  A1c;  LDL, HDL, Triglycerides, and  Waist Circumference were discussed in detail.  2) BP/HTN:  Her blood pressure is low today.  Says her cardiologist is aware it has been running low- recently had stents placed.   3) Lipids/HPL:  Her most recent lipid panel from 04/09/21 shows controlled LDL at 36. She is advised to continue Lipitor 80 mg po daily at bedtime.  Side effects and precautions discussed with her.     4)  Weight/Diet:  Her Body mass index is 22.96 kg/m. - clearly complicating her diabetes care.  She is a candidate for modest weight loss.  CDE Consult has been initiated.   Exercise, and detailed carbohydrates information provided  -  detailed on discharge instructions.  5) Vitamin D Deficiency: Her most recent vitamin D level on 04/09/21 was 7.1.  She is not currently on any supplementation.  I discussed and initiated replenishment with Ergocalciferol 50000 units weekly.  6) Chronic Care/Health Maintenance: -she is on Statin medications and is encouraged to initiate and continue to follow up with Ophthalmology, Dentist, pulmonology given her oxygen requiring COPD, podiatrist at least yearly or according to recommendations, and advised to quit smoking (this patient smoked 60+ pack year starting from age 28). I have recommended yearly flu vaccine and pneumonia vaccine at least every 5 years; moderate intensity exercise for up to 150 minutes weekly; and  sleep for at least 7 hours a day.  -She is starting to show interest in quitting smoking asking about the patches and how they work.  She has the patches waiting on her to pick up at the pharmacy.  Her husband is also encouraging her to quit.  Will follow up on subsequent visits.  - I advised patient to maintain close follow up with Joya Salm for primary care needs.    I spent 40 minutes in the care of  the patient today including review of labs from CMP, Lipids, Thyroid Function, Hematology (current and previous including abstractions from other facilities); face-to-face time discussing  her blood glucose readings/logs, discussing hypoglycemia and hyperglycemia episodes and symptoms, medications doses, her options of short and long term treatment based on the latest standards of care / guidelines;  discussion about incorporating lifestyle medicine;  and documenting the encounter.    Please refer to Patient Instructions for Blood Glucose  Monitoring and Insulin/Medications Dosing Guide"  in media tab for additional information. Please  also refer to " Patient Self Inventory" in the Media  tab for reviewed elements of pertinent patient history.  [name] participated in the discussions, expressed understanding, and voiced agreement with the above plans.  All questions were answered to her satisfaction. she is encouraged to contact clinic should she have any questions or concerns prior to her return visit.   Follow up plan: - Return in about 3 months (around 10/26/2021) for Diabetes F/U- A1c and UM in office, No previsit labs, Bring meter and logs.  Ronny Bacon, [facility] [address] [place], [place] [address] Phone: [phone] Fax: [phone]  07/28/2021, 2:03 PM

## 2021-07-28 NOTE — Addendum Note (Signed)
Addended by: Ronita Hipps on: 07/28/2021 02:12 PM   Modules accepted: Orders

## 2021-07-28 NOTE — Patient Instructions (Signed)

## 2021-07-29 ENCOUNTER — Ambulatory Visit: Payer: Federal, State, Local not specified - PPO | Admitting: Nurse Practitioner

## 2021-07-30 ENCOUNTER — Other Ambulatory Visit: Payer: Self-pay | Admitting: Nurse Practitioner

## 2021-07-30 DIAGNOSIS — E1165 Type 2 diabetes mellitus with hyperglycemia: Secondary | ICD-10-CM

## 2021-08-20 ENCOUNTER — Encounter (HOSPITAL_COMMUNITY): Payer: Self-pay | Admitting: *Deleted

## 2021-08-20 ENCOUNTER — Emergency Department (HOSPITAL_COMMUNITY): Payer: Medicare Other

## 2021-08-20 ENCOUNTER — Encounter (INDEPENDENT_AMBULATORY_CARE_PROVIDER_SITE_OTHER): Payer: Self-pay | Admitting: Gastroenterology

## 2021-08-20 ENCOUNTER — Other Ambulatory Visit: Payer: Self-pay

## 2021-08-20 ENCOUNTER — Ambulatory Visit (INDEPENDENT_AMBULATORY_CARE_PROVIDER_SITE_OTHER): Payer: Medicare Other | Admitting: Gastroenterology

## 2021-08-20 ENCOUNTER — Inpatient Hospital Stay (HOSPITAL_COMMUNITY)
Admission: EM | Admit: 2021-08-20 | Discharge: 2021-08-23 | DRG: 193 | Disposition: A | Discharge status: Home / Self Care | Payer: Medicare Other | Source: Ambulatory Visit | Attending: Internal Medicine | Admitting: Internal Medicine

## 2021-08-20 VITALS — BP 75/48 | HR 108 | Temp 97.7°F | Ht 63.0 in | Wt 115.4 lb

## 2021-08-20 DIAGNOSIS — E43 Unspecified severe protein-calorie malnutrition: Secondary | ICD-10-CM | POA: Insufficient documentation

## 2021-08-20 DIAGNOSIS — Z794 Long term (current) use of insulin: Secondary | ICD-10-CM

## 2021-08-20 DIAGNOSIS — Z7951 Long term (current) use of inhaled steroids: Secondary | ICD-10-CM | POA: Diagnosis not present

## 2021-08-20 DIAGNOSIS — J441 Chronic obstructive pulmonary disease with (acute) exacerbation: Secondary | ICD-10-CM

## 2021-08-20 DIAGNOSIS — R112 Nausea with vomiting, unspecified: Secondary | ICD-10-CM

## 2021-08-20 DIAGNOSIS — K219 Gastro-esophageal reflux disease without esophagitis: Secondary | ICD-10-CM | POA: Diagnosis present

## 2021-08-20 DIAGNOSIS — E2740 Unspecified adrenocortical insufficiency: Secondary | ICD-10-CM

## 2021-08-20 DIAGNOSIS — Z885 Allergy status to narcotic agent status: Secondary | ICD-10-CM | POA: Diagnosis not present

## 2021-08-20 DIAGNOSIS — F418 Other specified anxiety disorders: Secondary | ICD-10-CM | POA: Diagnosis not present

## 2021-08-20 DIAGNOSIS — I10 Essential (primary) hypertension: Secondary | ICD-10-CM | POA: Diagnosis present

## 2021-08-20 DIAGNOSIS — J40 Bronchitis, not specified as acute or chronic: Secondary | ICD-10-CM | POA: Diagnosis not present

## 2021-08-20 DIAGNOSIS — J189 Pneumonia, unspecified organism: Principal | ICD-10-CM | POA: Diagnosis present

## 2021-08-20 DIAGNOSIS — E785 Hyperlipidemia, unspecified: Secondary | ICD-10-CM

## 2021-08-20 DIAGNOSIS — Z7982 Long term (current) use of aspirin: Secondary | ICD-10-CM | POA: Diagnosis not present

## 2021-08-20 DIAGNOSIS — Z20822 Contact with and (suspected) exposure to covid-19: Secondary | ICD-10-CM | POA: Diagnosis present

## 2021-08-20 DIAGNOSIS — Z888 Allergy status to other drugs, medicaments and biological substances status: Secondary | ICD-10-CM

## 2021-08-20 DIAGNOSIS — Z7902 Long term (current) use of antithrombotics/antiplatelets: Secondary | ICD-10-CM | POA: Diagnosis not present

## 2021-08-20 DIAGNOSIS — R1310 Dysphagia, unspecified: Secondary | ICD-10-CM

## 2021-08-20 DIAGNOSIS — J440 Chronic obstructive pulmonary disease with acute lower respiratory infection: Secondary | ICD-10-CM | POA: Diagnosis present

## 2021-08-20 DIAGNOSIS — Z720 Tobacco use: Secondary | ICD-10-CM | POA: Diagnosis not present

## 2021-08-20 DIAGNOSIS — F17210 Nicotine dependence, cigarettes, uncomplicated: Secondary | ICD-10-CM | POA: Diagnosis present

## 2021-08-20 DIAGNOSIS — Z79899 Other long term (current) drug therapy: Secondary | ICD-10-CM | POA: Diagnosis not present

## 2021-08-20 DIAGNOSIS — J209 Acute bronchitis, unspecified: Secondary | ICD-10-CM | POA: Diagnosis present

## 2021-08-20 DIAGNOSIS — Z7984 Long term (current) use of oral hypoglycemic drugs: Secondary | ICD-10-CM

## 2021-08-20 DIAGNOSIS — D649 Anemia, unspecified: Secondary | ICD-10-CM | POA: Diagnosis not present

## 2021-08-20 DIAGNOSIS — F419 Anxiety disorder, unspecified: Secondary | ICD-10-CM | POA: Diagnosis present

## 2021-08-20 DIAGNOSIS — F32A Depression, unspecified: Secondary | ICD-10-CM | POA: Diagnosis present

## 2021-08-20 DIAGNOSIS — R197 Diarrhea, unspecified: Secondary | ICD-10-CM | POA: Diagnosis not present

## 2021-08-20 DIAGNOSIS — E11649 Type 2 diabetes mellitus with hypoglycemia without coma: Secondary | ICD-10-CM | POA: Diagnosis present

## 2021-08-20 DIAGNOSIS — Z6820 Body mass index (BMI) 20.0-20.9, adult: Secondary | ICD-10-CM | POA: Diagnosis not present

## 2021-08-20 DIAGNOSIS — Z9981 Dependence on supplemental oxygen: Secondary | ICD-10-CM

## 2021-08-20 DIAGNOSIS — E7800 Pure hypercholesterolemia, unspecified: Secondary | ICD-10-CM | POA: Diagnosis present

## 2021-08-20 DIAGNOSIS — J9621 Acute and chronic respiratory failure with hypoxia: Secondary | ICD-10-CM | POA: Diagnosis present

## 2021-08-20 DIAGNOSIS — R634 Abnormal weight loss: Secondary | ICD-10-CM

## 2021-08-20 LAB — CBC
HCT: 47.1 % — ABNORMAL HIGH (ref 36.0–46.0)
Hemoglobin: 12.1 g/dL (ref 12.0–15.0)
MCH: 20.8 pg — ABNORMAL LOW (ref 26.0–34.0)
MCHC: 25.7 g/dL — ABNORMAL LOW (ref 30.0–36.0)
MCV: 80.9 fL (ref 80.0–100.0)
Platelets: 302 10*3/uL (ref 150–400)
RBC: 5.82 MIL/uL — ABNORMAL HIGH (ref 3.87–5.11)
RDW: 34.1 % — ABNORMAL HIGH (ref 11.5–15.5)
WBC: 6.5 10*3/uL (ref 4.0–10.5)
nRBC: 0 % (ref 0.0–0.2)

## 2021-08-20 LAB — TROPONIN I (HIGH SENSITIVITY)
Troponin I (High Sensitivity): 4 ng/L (ref <18)
Troponin I (High Sensitivity): 5 ng/L (ref <18)

## 2021-08-20 LAB — BASIC METABOLIC PANEL
Anion gap: 10 (ref 5–15)
BUN: 7 mg/dL — ABNORMAL LOW (ref 8–23)
CO2: 26 mmol/L (ref 22–32)
Calcium: 9 mg/dL (ref 8.9–10.3)
Chloride: 99 mmol/L (ref 98–111)
Creatinine, Ser: 0.64 mg/dL (ref 0.44–1.00)
GFR, Estimated: >60 mL/min (ref >60)
Glucose, Bld: 130 mg/dL — ABNORMAL HIGH (ref 70–99)
Potassium: 4.2 mmol/L (ref 3.5–5.1)
Sodium: 135 mmol/L (ref 135–145)

## 2021-08-20 LAB — D-DIMER, QUANTITATIVE: D-Dimer, Quant: 0.3 ug/mL-FEU (ref 0.00–0.50)

## 2021-08-20 LAB — BLOOD GAS, VENOUS
Acid-base deficit: 3.1 mmol/L — ABNORMAL HIGH (ref 0.0–2.0)
Bicarbonate: 20.2 mmol/L (ref 20.0–28.0)
Drawn by: 6352
FIO2: 36
O2 Saturation: 49.4 %
Patient temperature: 36.4
pCO2, Ven: 54.9 mmHg (ref 44.0–60.0)
pH, Ven: 7.247 — ABNORMAL LOW (ref 7.250–7.430)
pO2, Ven: 32.7 mmHg (ref 32.0–45.0)

## 2021-08-20 LAB — PROCALCITONIN: Procalcitonin: <0.1 ng/mL

## 2021-08-20 LAB — RESP PANEL BY RT-PCR (FLU A&B, COVID) ARPGX2
Influenza A by PCR: NEGATIVE
Influenza B by PCR: NEGATIVE
SARS Coronavirus 2 by RT PCR: NEGATIVE

## 2021-08-20 LAB — LACTIC ACID, PLASMA: Lactic Acid, Venous: 0.9 mmol/L (ref 0.5–1.9)

## 2021-08-20 LAB — CBG MONITORING, ED: Glucose-Capillary: 256 mg/dL — ABNORMAL HIGH (ref 70–99)

## 2021-08-20 MED ORDER — ALBUTEROL SULFATE (2.5 MG/3ML) 0.083% IN NEBU
2.5000 mg | INHALATION_SOLUTION | Freq: Once | RESPIRATORY_TRACT | Status: AC
  Administered 2021-08-20: 2.5 mg via RESPIRATORY_TRACT
  Filled 2021-08-20: qty 3

## 2021-08-20 MED ORDER — SODIUM CHLORIDE 0.9 % IV BOLUS
1000.0000 mL | Freq: Once | INTRAVENOUS | Status: AC
  Administered 2021-08-20: 1000 mL via INTRAVENOUS

## 2021-08-20 MED ORDER — IPRATROPIUM-ALBUTEROL 0.5-2.5 (3) MG/3ML IN SOLN
3.0000 mL | Freq: Four times a day (QID) | RESPIRATORY_TRACT | Status: DC
  Administered 2021-08-20: 3 mL via RESPIRATORY_TRACT
  Filled 2021-08-20: qty 3

## 2021-08-20 MED ORDER — NOREPINEPHRINE 4 MG/250ML-% IV SOLN
0.0000 ug/min | INTRAVENOUS | Status: DC
  Administered 2021-08-21: 2 ug/min via INTRAVENOUS
  Filled 2021-08-20 (×3): qty 250

## 2021-08-20 MED ORDER — IPRATROPIUM-ALBUTEROL 0.5-2.5 (3) MG/3ML IN SOLN
3.0000 mL | Freq: Three times a day (TID) | RESPIRATORY_TRACT | Status: DC
  Administered 2021-08-21 – 2021-08-23 (×7): 3 mL via RESPIRATORY_TRACT
  Filled 2021-08-20 (×7): qty 3

## 2021-08-20 MED ORDER — MIDODRINE HCL 5 MG PO TABS
5.0000 mg | ORAL_TABLET | Freq: Three times a day (TID) | ORAL | Status: DC
  Administered 2021-08-20 – 2021-08-23 (×9): 5 mg via ORAL
  Filled 2021-08-20 (×9): qty 1

## 2021-08-20 MED ORDER — IPRATROPIUM-ALBUTEROL 0.5-2.5 (3) MG/3ML IN SOLN
3.0000 mL | Freq: Once | RESPIRATORY_TRACT | Status: AC
  Administered 2021-08-20: 3 mL via RESPIRATORY_TRACT
  Filled 2021-08-20: qty 3

## 2021-08-20 MED ORDER — SODIUM CHLORIDE 0.9 % IV SOLN
2.0000 g | INTRAVENOUS | Status: DC
  Administered 2021-08-20 – 2021-08-21 (×2): 2 g via INTRAVENOUS
  Filled 2021-08-20 (×2): qty 20

## 2021-08-20 MED ORDER — SODIUM CHLORIDE 0.9 % IV SOLN
500.0000 mg | INTRAVENOUS | Status: DC
  Administered 2021-08-20 – 2021-08-21 (×2): 500 mg via INTRAVENOUS
  Filled 2021-08-20 (×2): qty 5

## 2021-08-20 MED ORDER — ALBUTEROL SULFATE (2.5 MG/3ML) 0.083% IN NEBU: 2.5000 mg | INHALATION_SOLUTION | RESPIRATORY_TRACT | Status: DC | PRN

## 2021-08-20 MED ORDER — METHYLPREDNISOLONE SODIUM SUCC 125 MG IJ SOLR
125.0000 mg | Freq: Once | INTRAMUSCULAR | Status: AC
  Administered 2021-08-20: 125 mg via INTRAVENOUS
  Filled 2021-08-20: qty 2

## 2021-08-20 MED ORDER — SODIUM CHLORIDE 0.9 % IV BOLUS
500.0000 mL | Freq: Once | INTRAVENOUS | Status: AC
  Administered 2021-08-20: 500 mL via INTRAVENOUS

## 2021-08-20 NOTE — ED Provider Notes (Signed)
[facility] EMERGENCY DEPARTMENT Provider Note   CSN: [id number] Arrival date & time: 08/20/21  1144     History  Chief Complaint  Patient presents with   Hypotension    Dana Dominguez is a 66 y.o. female.  Patient went to her GI doctor to arrange for endoscopy.  Patient states that she was told to come to the hospital because her blood pressure was low.  She has had a mild cough for 3 days  The history is provided by the patient and medical records. No language interpreter was used.  Cough Cough characteristics:  Non-productive Sputum characteristics:  Nondescript Severity:  Moderate Onset quality:  Sudden Timing:  Constant Progression:  Worsening Chronicity:  Recurrent Smoker: no   Context: not animal exposure   Relieved by:  Nothing Associated symptoms: no chest pain, no eye discharge, no headaches and no rash       Home Medications Prior to Admission medications   Medication Sig Start Date End Date Taking? Authorizing Provider  ANORO ELLIPTA 62.5-25 MCG/INH AEPB Inhale 1 puff into the lungs daily. 12/18/20  Yes [provider]  ARIPiprazole (ABILIFY) 5 MG tablet Take 5 mg by mouth daily.   Yes [provider]  aspirin EC 81 MG tablet Take 81 mg by mouth daily.   Yes [provider]  atorvastatin (LIPITOR) 80 MG tablet Take 80 mg by mouth daily.    Yes [provider]  buPROPion (WELLBUTRIN XL) 300 MG 24 hr tablet Take 300 mg by mouth daily. 07/28/21  Yes [provider]  busPIRone (BUSPAR) 15 MG tablet Take 15 mg by mouth 3 (three) times daily.   Yes [provider]  clopidogrel (PLAVIX) 75 MG tablet Take 75 mg by mouth daily. 05/05/21  Yes [provider]  desloratadine (CLARINEX) 5 MG tablet Take 5 mg by mouth daily.   Yes [provider]  dexlansoprazole (DEXILANT) 60 MG capsule Take 60 mg by mouth daily.   Yes [provider]  dicyclomine (BENTYL) 10 MG capsule Take 10 mg by mouth 2 (two)  times daily.   Yes [provider]  Docusate Sodium (DSS) 100 MG CAPS Take 1 capsule by mouth daily. 05/25/17  Yes [provider]  DULoxetine (CYMBALTA) 60 MG capsule Take 60 mg by mouth 2 (two) times daily.   Yes [provider]  glipiZIDE (GLUCOTROL XL) 5 MG 24 hr tablet TAKE 1 TABLET BY MOUTH EVERY DAY WITH BREAKFAST. NEEDS APPT FOR MORE REFILLS. 07/28/21  Yes Brita Romp, NP  hydroxychloroquine (PLAQUENIL) 200 MG tablet Take 200 mg by mouth daily. 09/01/20  Yes [provider]  hydrOXYzine (ATARAX/VISTARIL) 50 MG tablet Take 50 mg by mouth See admin instructions. Take 1 tablet by mouth twice daily; may also take twice more as needed for anxiety.   Yes [provider]  Insulin Glargine (BASAGLAR KWIKPEN) 100 UNIT/ML Inject 40 Units into the skin at bedtime. Sliding scale 07/28/21  Yes Reardon, Juanetta Beets, NP  metFORMIN (GLUCOPHAGE-XR) 500 MG 24 hr tablet TAKE 2 TABLETS BY MOUTH ONCE DAILY WITH BREAKFAST 07/28/21  Yes Reardon, Loree Fee J, NP  ondansetron (ZOFRAN) 4 MG tablet Take 1 tablet (4 mg total) by mouth every 6 (six) hours. 01/12/20  Yes Marcello Fennel, PA-C  traZODone (DESYREL) 100 MG tablet Take 200 mg by mouth at bedtime.  10/22/14  Yes [provider]  Vitamin D, Ergocalciferol, (DRISDOL) 1.25 MG (50000 UNIT) CAPS capsule Take 1 capsule (50,000 Units total) by  mouth every 7 (seven) days. 07/28/21  Yes Brita Romp, NP  B-D ULTRAFINE III SHORT PEN 31G X 8 MM MISC SMARTSIG:1 Each SUB-Q Daily 06/17/20   Brita Romp, NP  BD INSULIN SYRINGE U/F 31G X 5/16" 0.3 ML MISC 1 each by Other route at bedtime. 06/16/20   Cassandria Anger, MD  Continuous Blood Gluc Sensor (FREESTYLE LIBRE 14 DAY SENSOR) MISC INJECT 1 SENSOR INTO THE SKIN EVERY 14 DAYS AS DIRECTED 07/30/21   Brita Romp, NP  HYDROcodone-acetaminophen (NORCO/VICODIN) 5-325 MG tablet Take 1 tablet by mouth every 4 (four) hours as needed. Patient not taking:  Reported on 08/20/2021 03/25/21   Isla Pence, MD  ondansetron (ZOFRAN ODT) 4 MG disintegrating tablet Take 1 tablet (4 mg total) by mouth every 8 (eight) hours as needed for nausea or vomiting. Patient not taking: Reported on 08/20/2021 03/25/21   Isla Pence, MD      Allergies    Ticagrelor, Codeine, Pantoprazole, Protonix [pantoprazole sodium], and Itraconazole    Review of Systems   Review of Systems  Constitutional:  Negative for appetite change and fatigue.  HENT:  Negative for congestion, ear discharge and sinus pressure.   Eyes:  Negative for discharge.  Respiratory:  Positive for cough.   Cardiovascular:  Negative for chest pain.  Gastrointestinal:  Negative for abdominal pain and diarrhea.  Genitourinary:  Negative for frequency and hematuria.  Musculoskeletal:  Negative for back pain.  Skin:  Negative for rash.  Neurological:  Negative for seizures and headaches.  Psychiatric/Behavioral:  Negative for hallucinations.    Physical Exam Updated Vital Signs BP (!) 83/50    Pulse 95    Temp 97.6 F (36.4 C) (Oral)    Resp 19    Ht 5\' 3"  (1.6 m)    Wt 52.2 kg    SpO2 95%    BMI 20.37 kg/m  Physical Exam Vitals and nursing note reviewed.  Constitutional:      Appearance: She is well-developed.  HENT:     Head: Normocephalic.     Nose: Nose normal.  Eyes:     General: No scleral icterus.    Conjunctiva/sclera: Conjunctivae normal.  Neck:     Thyroid: No thyromegaly.  Cardiovascular:     Rate and Rhythm: Normal rate and regular rhythm.     Heart sounds: No murmur heard.   No friction rub. No gallop.  Pulmonary:     Breath sounds: No stridor. No wheezing or rales.  Chest:     Chest wall: No tenderness.  Abdominal:     General: There is no distension.     Tenderness: There is no abdominal tenderness. There is no rebound.  Musculoskeletal:        General: Normal range of motion.     Cervical back: Neck supple.  Lymphadenopathy:     Cervical: No cervical  adenopathy.  Skin:    Findings: No erythema or rash.  Neurological:     Mental Status: She is alert and oriented to person, place, and time.     Motor: No abnormal muscle tone.     Coordination: Coordination normal.  Psychiatric:        Behavior: Behavior normal.    ED Results / Procedures / Treatments   Labs (all labs ordered are listed, but only abnormal results are displayed) Labs Reviewed  BASIC METABOLIC PANEL - Abnormal; Notable for the following components:      Result Value   Glucose, Bld 130 (*)  BUN 7 (*)    All other components within normal limits  CBC - Abnormal; Notable for the following components:   RBC 5.82 (*)    HCT 47.1 (*)    MCH 20.8 (*)    MCHC 25.7 (*)    RDW 34.1 (*)    All other components within normal limits  CULTURE, BLOOD (SINGLE)  LACTIC ACID, PLASMA  D-DIMER, QUANTITATIVE  TROPONIN I (HIGH SENSITIVITY)  TROPONIN I (HIGH SENSITIVITY)    EKG EKG Interpretation  Date/Time:  Thursday August 20 2021 13:15:47 EST Ventricular Rate:  94 PR Interval:  140 QRS Duration: 78 QT Interval:  386 QTC Calculation: 482 R Axis:   45 Text Interpretation: Normal sinus rhythm Right atrial enlargement Septal infarct , age undetermined Abnormal ECG When compared with ECG of 21-Jan-2021 17:05, no significant change Confirmed by Aletta Edouard [id number]) on 08/20/2021 1:20:46 PM  Radiology DG Chest 2 View  Result Date: 08/20/2021 CLINICAL DATA:  Hypotension EXAM: CHEST - 2 VIEW COMPARISON:  Chest x-ray 03/25/2021 FINDINGS: Heart size and mediastinum are stable and within normal limits. Calcified plaques in the aortic arch. Vascular stent graft in the left mediastinum. No focal consolidation, pleural effusion or pneumothorax identified. IMPRESSION: No acute intrathoracic process identified. Electronically Signed   By: Ofilia Neas M.D.   On: 08/20/2021 13:32    Procedures Procedures    Medications Ordered in ED Medications  cefTRIAXone (ROCEPHIN) 2 g  in sodium chloride 0.9 % 100 mL IVPB (has no administration in time range)  azithromycin (ZITHROMAX) 500 mg in sodium chloride 0.9 % 250 mL IVPB (has no administration in time range)  sodium chloride 0.9 % bolus 1,000 mL (has no administration in time range)  sodium chloride 0.9 % bolus 1,000 mL (0 mLs Intravenous Stopped 08/20/21 1822)  sodium chloride 0.9 % bolus 1,000 mL (1,000 mLs Intravenous New Bag/Given 08/20/21 1746)  methylPREDNISolone sodium succinate (SOLU-MEDROL) 125 mg/2 mL injection 125 mg (125 mg Intravenous Given 08/20/21 1752)  ipratropium-albuterol (DUONEB) 0.5-2.5 (3) MG/3ML nebulizer solution 3 mL (3 mLs Nebulization Given 08/20/21 1756)  albuterol (PROVENTIL) (2.5 MG/3ML) 0.083% nebulizer solution 2.5 mg (2.5 mg Nebulization Given 08/20/21 1756)  ipratropium-albuterol (DUONEB) 0.5-2.5 (3) MG/3ML nebulizer solution 3 mL (3 mLs Nebulization Given 08/20/21 1930)  albuterol (PROVENTIL) (2.5 MG/3ML) 0.083% nebulizer solution 2.5 mg (2.5 mg Nebulization Given 08/20/21 1930)    ED Course/ Medical Decision Making/ A&P         CRITICAL CARE Performed by: Milton Ferguson Total critical care time: 45 minutes Critical care time was exclusive of separately billable procedures and treating other patients. Critical care was necessary to treat or prevent imminent or life-threatening deterioration. Critical care was time spent personally by me on the following activities: development of treatment plan with patient and/or surrogate as well as nursing, discussions with consultants, evaluation of patient's response to treatment, examination of patient, obtaining history from patient or surrogate, ordering and performing treatments and interventions, ordering and review of laboratory studies, ordering and review of radiographic studies, pulse oximetry and re-evaluation of patient's condition.                   Medical Decision Making  Patient with hypotension and exacerbation of COPD.  She will be treated  with antibiotics and fluids and neb treatments and admitted To medicine This patient presents to the ED for concern of cough and hypotension, this involves an extensive number of treatment options, and is a complaint that carries with it a  high risk of complications and morbidity.  The differential diagnosis includes pneumonia and sepsis, COVID-19, COPD exacerbation   Co morbidities that complicate the patient evaluation  COPD   Additional history obtained:  Additional history obtained from records from hospital External records from outside source obtained and reviewed including records from office   Lab Tests:  I Ordered, and personally interpreted labs.  The pertinent results include: CBC shows white count mildly low and hemoglobin low at 9.9   Imaging Studies ordered:  I ordered imaging studies including chest x-ray I independently visualized and interpreted imaging which showed unremarkable I agree with the radiologist interpretation   Cardiac Monitoring:  The patient was maintained on a cardiac monitor.  I personally viewed and interpreted the cardiac monitored which showed an underlying rhythm of: Normal sinus rhythm   Medicines ordered and prescription drug management:  I ordered medication including, and albuterol for COPD exacerbation and hypoxia, also normal saline for hypotension Reevaluation of the patient after these medicines showed that the patient stayed the same I have reviewed the patients home medicines and have made adjustments as needed   Test Considered:  CT chest   Critical Interventions:  Normal saline and antibiotics, steroids, neb treatment   Consultations Obtained:  I requested consultation with the hospitalist,  and discussed lab and imaging findings as well as pertinent plan - they recommend: Admission   Problem List / ED Course:  COPD, hypotension   Reevaluation:  After the interventions noted above, I reevaluated the  patient and found that they have :improved   Social Determinants of Health:  Patient still smokes   Dispostion:  After consideration of the diagnostic results and the patients response to treatment, I feel that the patent would benefit from admission to the hospital and treatment for hypertension and COPD exacerbation with hypoxia.         Final Clinical Impression(s) / ED Diagnoses Final diagnoses:  COPD exacerbation ([place])    Rx / DC Orders ED Discharge Orders     None         Milton Ferguson, MD 08/21/21 1052

## 2021-08-20 NOTE — ED Triage Notes (Signed)
Patient referred from gastroenterologist for low blood pressure

## 2021-08-20 NOTE — Progress Notes (Signed)
Referring Provider: Elyn Aquas, MD Primary Care Physician:  Elyn Aquas Primary GI Physician:   Chief Complaint  Patient presents with   Abdominal Pain    Hospital follow up. Has not had colonoscopy hospital recommended. Had iron infusion in [place] yesterday, Lower abdominal pain, a little diarrhea, nausea, appetite is not good. Having gas and heartburn.    HPI:   Dana Dominguez is a 66 y.o. 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  Patient presenting today as a new patient for hospital follow up.  Today, Patient is accompanied by her husband. She states that her blood counts have been low over the past few months. She reports that she has been very tired/weak and is having pain in her legs. She reports that she has not had any melena but she has noticed some BRBPR on occasion when having a BM. In august she went to [facility] with nausea, vomiting and diarrhea, she also had previous admission in June for anemia thought to be either chronic disease or chronic blood loss, it was recommended that patient have EGD and colonoscopy at that time, however, patient declined and wanted to see her primary GI to have procedures done outpatient, though she reports they put her off and she was never able to have endoscopic evaluation. she Received 1 unit PRBC at that time and was discharged with PO iron. She was FOBT pos in August with CT findings consistent with colitis of ascending colon. In October she was seen at [facility] ED again for similar symptoms, CTA A/P with occlusion of proximal SMA with reconstitution of diminished flow in midportion of SMA, stent in celiac trunk.   Notably, she is followed at [facility] for her mesenteric artery stenosis with most recent MA [place] 06/01/21 with findings greater than 50%  stenosis by velocity criteria at the proximal segment of bilateral CIA, greater than 70% stenosis by velocity criteria at proximal segment of celiac axis, SMA and IMA. Significant stenosis or occlusion could not be excluded at mid to distal SMA. She underwent bilateral arteriogram on 06/05/21 with 90% restenosis of ostial stent in Celiac Artery, occluded SMA as ostium w collateral filliing from celiac artery, successful PTA of Celiac Artery with 47mm Sterling balloon followed by inflation of drug coated balloon, yielding excellent angiographic results.   patient tells me She had a blood transfusion at [facility] in [place] a few weeks ago as well, she had previously had outpatient labs and was told her blood counts were low so she was sent to ED, she reports she was not admitted at that time. She has also received 4 iron infusions, last 2 iron infusions were last Friday, these were ordered by [facility] hematology/oncolology who she follows with. Hgb 8 on 06/10/21, unable to review any more recent labs.   She reports that she has lower abdominal pain almost daily. She continues to have nausea on a regular basis. She is having about 3 episodes of diarrhea per day, this has been going on for the past few months, stools are very loose but not watery. She has not had a solid stool since this began. Appetite is not great and she feels that abdominal pain is worse with eating.  She also reports some issues with dysphagia, almost everytime she eats, typically food will pass, she has had no episodes of food impaction.  She reports she has lost about 38 pounds since mid 2022.   In 2014 she had 1 ft of colon removed r/t diverticulitis.   Notably, patient is very hypotensive in office today at 75/48, with HR of 108. Patient reports BP is better than it was when checked yesterday. BP on recheck 81/57. She endorses some lightheadedness upon standing.  NSAID [id number] Social hx: not etoh, tobacco 1PPD Fam hx:no  significant family history   Last Colonoscopy:5 years ago [place], will obtain records  Last Endoscopy:11/21/14, will obtain records  Past Medical History:  Diagnosis Date   Back pain    COPD (chronic obstructive pulmonary disease) ([place])    Diabetes ([place])    Dysphagia 09/29/2016   GERD (gastroesophageal reflux disease)    Heart murmur    High cholesterol    On supplemental oxygen therapy    3L [place]  at night    Past Surgical History:  Procedure Laterality Date   BACK SURGERY     x 2 in the past   carotid surgery     December 2015 rt    CHOLECYSTECTOMY     [facility]   COLON SURGERY     for diverticulitis   ESOPHAGEAL DILATION N/A 11/21/2014   Procedure: ESOPHAGEAL DILATION;  Surgeon: Rogene Houston, MD;  [facility];  Service: Endoscopy;  Laterality: N/A;   ESOPHAGOGASTRODUODENOSCOPY N/A 11/21/2014   Procedure: ESOPHAGOGASTRODUODENOSCOPY (EGD);  Surgeon: Rogene Houston, MD;  Location: [facility];  Service: Endoscopy;  Laterality: N/A;  1200 - moved to 4/7 @ 9:00   NASAL SINUS SURGERY     NECK SURGERY     stent in left arm     blockage 3 yrs ago.     Current Outpatient Medications  Medication Sig Dispense Refill   ANORO ELLIPTA 62.5-25 MCG/INH AEPB Inhale 1 puff into the lungs daily.     ARIPiprazole (ABILIFY) 5 MG tablet Take 5 mg by mouth daily.     aspirin EC 81 MG tablet Take 81 mg by mouth daily.     atorvastatin (LIPITOR) 80 MG tablet Take 80 mg by mouth daily.      B-D ULTRAFINE III SHORT PEN 31G X 8 MM MISC SMARTSIG:1 Each SUB-Q Daily 100 each 1   BD INSULIN SYRINGE U/F 31G X 5/16" 0.3 ML MISC 1 each by Other route at bedtime. 100 each 5   buPROPion (WELLBUTRIN XL) 150 MG 24 hr tablet Take by mouth. bid     busPIRone (BUSPAR) 15 MG tablet Take 15 mg by mouth 3 (three) times daily.     clopidogrel (PLAVIX) 75 MG tablet Take 75 mg by mouth daily.     Continuous Blood Gluc Sensor (FREESTYLE LIBRE 14 DAY SENSOR) MISC INJECT 1 SENSOR INTO THE SKIN EVERY  14 DAYS AS DIRECTED 2 each 2   desloratadine (CLARINEX) 5 MG tablet Take 5 mg by mouth daily.     dexlansoprazole (DEXILANT) 60 MG capsule Take 60 mg by mouth daily.     dicyclomine (BENTYL) 10 MG capsule Take 10 mg by mouth 2 (two) times daily.     Docusate Sodium (DSS) 100 MG CAPS Take 1 capsule by mouth daily.     DULoxetine (CYMBALTA) 60 MG capsule Take 60 mg by mouth 2 (two) times daily.     glipiZIDE (GLUCOTROL XL) 5 MG 24 hr tablet TAKE 1 TABLET BY MOUTH EVERY DAY WITH BREAKFAST. NEEDS APPT FOR MORE REFILLS. 90 tablet 3   HYDROcodone-acetaminophen (NORCO/VICODIN)  5-325 MG tablet Take 1 tablet by mouth every 4 (four) hours as needed. 10 tablet 0   hydroxychloroquine (PLAQUENIL) 200 MG tablet Take 200 mg by mouth daily.     hydrOXYzine (ATARAX/VISTARIL) 50 MG tablet Take 50 mg by mouth See admin instructions. Take 1 tablet by mouth twice daily; may also take twice more as needed for anxiety.     hydrOXYzine (VISTARIL) 50 MG capsule Take 50 mg by mouth 2 (two) times daily as needed.     Insulin Glargine (BASAGLAR KWIKPEN) 100 UNIT/ML Inject 40 Units into the skin at bedtime. Sliding scale 30 mL 3   metFORMIN (GLUCOPHAGE-XR) 500 MG 24 hr tablet TAKE 2 TABLETS BY MOUTH ONCE DAILY WITH BREAKFAST 180 tablet 3   ondansetron (ZOFRAN ODT) 4 MG disintegrating tablet Take 1 tablet (4 mg total) by mouth every 8 (eight) hours as needed for nausea or vomiting. 20 tablet 0   ondansetron (ZOFRAN) 4 MG tablet Take 1 tablet (4 mg total) by mouth every 6 (six) hours. 12 tablet 0   traZODone (DESYREL) 100 MG tablet Take 200 mg by mouth at bedtime.      Vitamin D, Ergocalciferol, (DRISDOL) 1.25 MG (50000 UNIT) CAPS capsule Take 1 capsule (50,000 Units total) by mouth every 7 (seven) days. 12 capsule 3   No current facility-administered medications for this visit.    Allergies as of 08/20/2021 - Review Complete 08/20/2021  Allergen Reaction Noted   Ticagrelor Shortness Of Breath 04/08/2020   Codeine Nausea  Only 10/23/2014   Pantoprazole Nausea Only 09/03/2020   Protonix [pantoprazole sodium]  10/23/2014   Itraconazole Rash 10/23/2014    Family History  Problem Relation Age of Onset   Cancer - Colon Neg Hx     Social History   Socioeconomic History   Marital status: Married    Spouse name: Not on file   Number of children: Not on file   Years of education: Not on file   Highest education level: Not on file  Occupational History   Not on file  Tobacco Use   Smoking status: Every Day    Packs/day: 2.00    Years: 50.00    Pack years: 100.00    Types: Cigarettes   Smokeless tobacco: Never   Tobacco comments:    1 pack day since 12 yrs  Vaping Use   Vaping Use: Never used  Substance and Sexual Activity   Alcohol use: No    Alcohol/week: 0.0 standard drinks   Drug use: No   Sexual activity: Not on file  Other Topics Concern   Not on file  Social History Narrative   Not on file   Social Determinants of Health   Financial Resource Strain: Not on file  Food Insecurity: Not on file  Transportation Needs: Not on file  Physical Activity: Not on file  Stress: Not on file  Social Connections: Not on file   Review of systems General: negative for malaise, night sweats, fever, chills. +weight loss Neck: Negative for lumps, goiter, pain and significant neck swelling Resp: Negative dyspnea at rest +cough +wheezing CV: Negative for chest pain, leg swelling, palpitations, orthopnea GI: denies melena, hematochezia, constipation, dysphagia, odyonophagia, early satiety +nausea +vomiting +diarrhea +weight loss  MSK: Negative for joint pain or swelling, back pain, and muscle pain. Derm: Negative for itching or rash Psych: Denies depression, anxiety, memory loss, confusion. No homicidal or suicidal ideation.  Heme: Negative for prolonged bleeding, bruising easily, and swollen nodes. Endocrine: Negative for cold  or heat intolerance, polyuria, polydipsia and goiter. Neuro: negative for  tremor, gait imbalance, syncope and seizures. The remainder of the review of systems is noncontributory.  Physical Exam: BP (!) 75/48 (BP Location: Right Arm, Patient Position: Sitting, Cuff Size: Normal)    Pulse (!) 108    Temp 97.7 F (36.5 C) (Oral)    Ht 5\' 3"  (1.6 m)    Wt 115 lb 6.4 oz (52.3 kg)    BMI 20.44 kg/m  General:   Alert and oriented. No distress noted. Pleasant and cooperative.  Head:  Normocephalic and atraumatic. Eyes:  Conjuctiva clear without scleral icterus. Mouth:  Oral mucosa pink and moist. Good dentition. No lesions. Heart: Normal rate and rhythm, s1 and s2 heart sounds present.  Lungs: +wheezing all lobs Abdomen:  +BS, soft, non-tender and non-distended. No rebound or guarding. No HSM or masses noted. Derm: No palmar erythema or jaundice Msk:  Symmetrical without gross deformities. Normal posture. Extremities:  Without edema. Neurologic:  Alert and  oriented x4 Psych:  Alert and cooperative. Normal mood and affect.  Invalid input(s): 6 MONTHS   ASSESSMENT: Carrol Hunke is a 66 y.o. female presenting today for ongoing anemia, fatigue and diarrhea.  Continued anemia requiring iron infusions/blood transfusions without previous endoscopic investigation. Patient denies recent hematochezia or melena, however she has had 38 lbs weight loss since mid 2022, as well as looser stools, dysphagia and nausea. Notably she has complicated hx of mesenteric ischemia with previous stent placement, likely contributing to ongoing lower abdominal pain and postprandial abdominal pain, however, she is followed by vascular for this who feels this is not the cause of her anemia/diarrhea/nausea. We should proceed with EGD and colonoscopy for further investigation as we cannot rule out PUD, AVMs, esophagitis, duodenitis, gastritis, bleeding polyps or malignancy. Indications, risks and benefits of procedure discussed in detail with patient. Patient verbalized understanding and is in agreement  to proceed with EGD/Colonoscopy.  Notably, Patient with hypotension (75/48) and lightheadedness in office today, repeat BP still revealed hypotension (81/56). I have discussed this with patient and husband with my recommendation being for her to proceed to the ER for further evaluation of her hypotension as I am concerned this could related to significant anemia/GI blood loss and requires urgent, further evaluation with possible blood transfusion and sooner endoscopic evaluation.  PLAN:  EGD/Colonoscopy 2. CBC/Iron studies 3. Patient should proceed to ED for further evaluation of hypotension/anemia/GI blood loss   Follow Up: TBD  Dana Dominguez L. Alver Sorrow, MSN, APRN, AGNP-C Adult-Gerontology Nurse Practitioner [facility]

## 2021-08-20 NOTE — Patient Instructions (Signed)
Please proceed to the ER as your BP is very low, I am concerned that you are having significant blood loss that is causing your BP to be this low, you need further evaluation of this with possible blood transfusion and will need EGD and colonoscopy for further evaluation of your anemia.

## 2021-08-20 NOTE — Sepsis Progress Note (Signed)
Following for sepsis monitoring ?

## 2021-08-20 NOTE — Progress Notes (Signed)
Pt eating treatment on hold will check back

## 2021-08-21 ENCOUNTER — Encounter (HOSPITAL_COMMUNITY): Payer: Self-pay | Admitting: Family Medicine

## 2021-08-21 DIAGNOSIS — J441 Chronic obstructive pulmonary disease with (acute) exacerbation: Secondary | ICD-10-CM

## 2021-08-21 DIAGNOSIS — J189 Pneumonia, unspecified organism: Principal | ICD-10-CM

## 2021-08-21 DIAGNOSIS — I959 Hypotension, unspecified: Secondary | ICD-10-CM

## 2021-08-21 LAB — CBC WITH DIFFERENTIAL/PLATELET
Abs Immature Granulocytes: 0.02 10*3/uL (ref 0.00–0.07)
Basophils Absolute: 0 10*3/uL (ref 0.0–0.1)
Basophils Relative: 1 %
Eosinophils Absolute: 0 10*3/uL (ref 0.0–0.5)
Eosinophils Relative: 0 %
HCT: 37.5 % (ref 36.0–46.0)
Hemoglobin: 9.9 g/dL — ABNORMAL LOW (ref 12.0–15.0)
Immature Granulocytes: 1 %
Lymphocytes Relative: 31 %
Lymphs Abs: 1.1 10*3/uL (ref 0.7–4.0)
MCH: 21.7 pg — ABNORMAL LOW (ref 26.0–34.0)
MCHC: 26.4 g/dL — ABNORMAL LOW (ref 30.0–36.0)
MCV: 82.1 fL (ref 80.0–100.0)
Monocytes Absolute: 0.4 10*3/uL (ref 0.1–1.0)
Monocytes Relative: 10 %
Neutro Abs: 1.9 10*3/uL (ref 1.7–7.7)
Neutrophils Relative %: 57 %
Platelets: 250 10*3/uL (ref 150–400)
RBC: 4.57 MIL/uL (ref 3.87–5.11)
RDW: 33.2 % — ABNORMAL HIGH (ref 11.5–15.5)
WBC: 3.4 10*3/uL — ABNORMAL LOW (ref 4.0–10.5)
nRBC: 0 % (ref 0.0–0.2)

## 2021-08-21 LAB — COMPREHENSIVE METABOLIC PANEL
ALT: 9 U/L (ref 0–44)
AST: 17 U/L (ref 15–41)
Albumin: 2.3 g/dL — ABNORMAL LOW (ref 3.5–5.0)
Alkaline Phosphatase: 73 U/L (ref 38–126)
Anion gap: 7 (ref 5–15)
BUN: 6 mg/dL — ABNORMAL LOW (ref 8–23)
CO2: 25 mmol/L (ref 22–32)
Calcium: 8.1 mg/dL — ABNORMAL LOW (ref 8.9–10.3)
Chloride: 107 mmol/L (ref 98–111)
Creatinine, Ser: 0.5 mg/dL (ref 0.44–1.00)
GFR, Estimated: >60 mL/min (ref >60)
Glucose, Bld: 101 mg/dL — ABNORMAL HIGH (ref 70–99)
Potassium: 3.9 mmol/L (ref 3.5–5.1)
Sodium: 139 mmol/L (ref 135–145)
Total Bilirubin: 0.3 mg/dL (ref 0.3–1.2)
Total Protein: 6.4 g/dL — ABNORMAL LOW (ref 6.5–8.1)

## 2021-08-21 LAB — CBG MONITORING, ED
Glucose-Capillary: 264 mg/dL — ABNORMAL HIGH (ref 70–99)
Glucose-Capillary: 137 mg/dL — ABNORMAL HIGH (ref 70–99)
Glucose-Capillary: 40 mg/dL — CL (ref 70–99)
Glucose-Capillary: 184 mg/dL — ABNORMAL HIGH (ref 70–99)

## 2021-08-21 LAB — CORTISOL: Cortisol, Plasma: 1.5 ug/dL

## 2021-08-21 LAB — BRAIN NATRIURETIC PEPTIDE: B Natriuretic Peptide: 194 pg/mL — ABNORMAL HIGH (ref 0.0–100.0)

## 2021-08-21 LAB — GLUCOSE, CAPILLARY
Glucose-Capillary: 144 mg/dL — ABNORMAL HIGH (ref 70–99)
Glucose-Capillary: 93 mg/dL (ref 70–99)

## 2021-08-21 LAB — D-DIMER, QUANTITATIVE: D-Dimer, Quant: 0.37 ug/mL-FEU (ref 0.00–0.50)

## 2021-08-21 LAB — PROCALCITONIN: Procalcitonin: <0.1 ng/mL

## 2021-08-21 LAB — MRSA NEXT GEN BY PCR, NASAL: MRSA by PCR Next Gen: NOT DETECTED

## 2021-08-21 LAB — MAGNESIUM: Magnesium: 1.6 mg/dL — ABNORMAL LOW (ref 1.7–2.4)

## 2021-08-21 MED ORDER — PANTOPRAZOLE SODIUM 40 MG PO TBEC
40.0000 mg | DELAYED_RELEASE_TABLET | Freq: Two times a day (BID) | ORAL | Status: DC
  Administered 2021-08-21 – 2021-08-23 (×5): 40 mg via ORAL
  Filled 2021-08-21 (×5): qty 1

## 2021-08-21 MED ORDER — ACETAMINOPHEN 650 MG RE SUPP: 650.0000 mg | Freq: Four times a day (QID) | RECTAL | Status: DC | PRN

## 2021-08-21 MED ORDER — INSULIN DETEMIR 100 UNIT/ML ~~LOC~~ SOLN
15.0000 [IU] | Freq: Every day | SUBCUTANEOUS | Status: DC
  Administered 2021-08-21: 15 [IU] via SUBCUTANEOUS
  Filled 2021-08-21 (×3): qty 0.15

## 2021-08-21 MED ORDER — HEPARIN SODIUM (PORCINE) 5000 UNIT/ML IJ SOLN: 5000.0000 [IU] | Freq: Three times a day (TID) | INTRAMUSCULAR | Status: DC

## 2021-08-21 MED ORDER — INSULIN ASPART 100 UNIT/ML IJ SOLN
0.0000 [IU] | Freq: Three times a day (TID) | INTRAMUSCULAR | Status: DC
  Administered 2021-08-21: 2 [IU] via SUBCUTANEOUS
  Administered 2021-08-21: 3 [IU] via SUBCUTANEOUS
  Administered 2021-08-22: 15 [IU] via SUBCUTANEOUS
  Administered 2021-08-23: 3 [IU] via SUBCUTANEOUS
  Administered 2021-08-23: 11 [IU] via SUBCUTANEOUS
  Filled 2021-08-21: qty 1

## 2021-08-21 MED ORDER — HEPARIN SODIUM (PORCINE) 5000 UNIT/ML IJ SOLN
5000.0000 [IU] | Freq: Three times a day (TID) | INTRAMUSCULAR | Status: DC
  Administered 2021-08-21 – 2021-08-23 (×8): 5000 [IU] via SUBCUTANEOUS
  Filled 2021-08-21 (×8): qty 1

## 2021-08-21 MED ORDER — PREDNISONE 20 MG PO TABS
40.0000 mg | ORAL_TABLET | Freq: Every day | ORAL | Status: DC
  Administered 2021-08-21 – 2021-08-23 (×3): 40 mg via ORAL
  Filled 2021-08-21 (×3): qty 2

## 2021-08-21 MED ORDER — ATORVASTATIN CALCIUM 40 MG PO TABS
80.0000 mg | ORAL_TABLET | Freq: Every day | ORAL | Status: DC
  Administered 2021-08-21 – 2021-08-23 (×3): 80 mg via ORAL
  Filled 2021-08-21 (×3): qty 2

## 2021-08-21 MED ORDER — UMECLIDINIUM-VILANTEROL 62.5-25 MCG/ACT IN AEPB
1.0000 | INHALATION_SPRAY | Freq: Every day | RESPIRATORY_TRACT | Status: DC
  Administered 2021-08-21 – 2021-08-23 (×3): 1 via RESPIRATORY_TRACT
  Filled 2021-08-21: qty 14

## 2021-08-21 MED ORDER — INSULIN DETEMIR 100 UNIT/ML ~~LOC~~ SOLN
30.0000 [IU] | Freq: Every day | SUBCUTANEOUS | Status: DC
  Administered 2021-08-21: 30 [IU] via SUBCUTANEOUS
  Filled 2021-08-21 (×4): qty 0.3

## 2021-08-21 MED ORDER — BUSPIRONE HCL 5 MG PO TABS
15.0000 mg | ORAL_TABLET | Freq: Three times a day (TID) | ORAL | Status: DC
  Administered 2021-08-21 – 2021-08-23 (×7): 15 mg via ORAL
  Filled 2021-08-21 (×7): qty 3

## 2021-08-21 MED ORDER — BUPROPION HCL ER (XL) 300 MG PO TB24
300.0000 mg | ORAL_TABLET | Freq: Every day | ORAL | Status: DC
Start: 2021-08-21 — End: 2021-08-23
  Administered 2021-08-21 – 2021-08-23 (×3): 300 mg via ORAL
  Filled 2021-08-21 (×2): qty 2
  Filled 2021-08-21: qty 1

## 2021-08-21 MED ORDER — DULOXETINE HCL 60 MG PO CPEP
60.0000 mg | ORAL_CAPSULE | Freq: Two times a day (BID) | ORAL | Status: DC
  Administered 2021-08-21 – 2021-08-23 (×6): 60 mg via ORAL
  Filled 2021-08-21: qty 1
  Filled 2021-08-21 (×2): qty 2
  Filled 2021-08-21 (×3): qty 1

## 2021-08-21 MED ORDER — DEXTROSE 50 % IV SOLN
INTRAVENOUS | Status: AC
  Administered 2021-08-21: 50 mL via INTRAVENOUS
  Filled 2021-08-21: qty 50

## 2021-08-21 MED ORDER — HYDROXYZINE HCL 25 MG PO TABS
50.0000 mg | ORAL_TABLET | Freq: Two times a day (BID) | ORAL | Status: DC
  Administered 2021-08-21 – 2021-08-23 (×6): 50 mg via ORAL
  Filled 2021-08-21 (×6): qty 2

## 2021-08-21 MED ORDER — ACETAMINOPHEN 325 MG PO TABS: 650.0000 mg | ORAL_TABLET | Freq: Four times a day (QID) | ORAL | Status: DC | PRN

## 2021-08-21 MED ORDER — ONDANSETRON HCL 4 MG PO TABS
4.0000 mg | ORAL_TABLET | Freq: Four times a day (QID) | ORAL | Status: DC | PRN
  Administered 2021-08-21: 4 mg via ORAL
  Filled 2021-08-21: qty 1

## 2021-08-21 MED ORDER — SODIUM CHLORIDE 0.9 % IV BOLUS
250.0000 mL | Freq: Once | INTRAVENOUS | Status: AC
  Administered 2021-08-21: 250 mL via INTRAVENOUS

## 2021-08-21 MED ORDER — NICOTINE 21 MG/24HR TD PT24
21.0000 mg | MEDICATED_PATCH | Freq: Every day | TRANSDERMAL | Status: DC
  Administered 2021-08-21 – 2021-08-23 (×3): 21 mg via TRANSDERMAL
  Filled 2021-08-21 (×3): qty 1

## 2021-08-21 MED ORDER — OXYCODONE HCL 5 MG PO TABS
5.0000 mg | ORAL_TABLET | ORAL | Status: DC | PRN
  Administered 2021-08-21 – 2021-08-23 (×7): 5 mg via ORAL
  Filled 2021-08-21 (×8): qty 1

## 2021-08-21 MED ORDER — INSULIN ASPART 100 UNIT/ML IJ SOLN
0.0000 [IU] | Freq: Every day | INTRAMUSCULAR | Status: DC
  Administered 2021-08-21: 3 [IU] via SUBCUTANEOUS
  Filled 2021-08-21: qty 1

## 2021-08-21 MED ORDER — HYDROXYZINE HCL 25 MG PO TABS: 50.0000 mg | ORAL_TABLET | ORAL | Status: DC

## 2021-08-21 MED ORDER — PANTOPRAZOLE SODIUM 40 MG PO TBEC: 40.0000 mg | DELAYED_RELEASE_TABLET | Freq: Every day | ORAL | Status: DC

## 2021-08-21 MED ORDER — ONDANSETRON HCL 4 MG/2ML IJ SOLN: 4.0000 mg | Freq: Four times a day (QID) | INTRAMUSCULAR | Status: DC | PRN

## 2021-08-21 MED ORDER — ASPIRIN EC 81 MG PO TBEC
81.0000 mg | DELAYED_RELEASE_TABLET | Freq: Every day | ORAL | Status: DC
  Administered 2021-08-21 – 2021-08-23 (×3): 81 mg via ORAL
  Filled 2021-08-21 (×3): qty 1

## 2021-08-21 MED ORDER — CLOPIDOGREL BISULFATE 75 MG PO TABS
75.0000 mg | ORAL_TABLET | Freq: Every day | ORAL | Status: DC
Start: 2021-08-21 — End: 2021-08-23
  Administered 2021-08-21 – 2021-08-23 (×3): 75 mg via ORAL
  Filled 2021-08-21 (×3): qty 1

## 2021-08-21 MED ORDER — ENSURE ENLIVE PO LIQD
237.0000 mL | Freq: Two times a day (BID) | ORAL | Status: DC
  Administered 2021-08-22 – 2021-08-23 (×3): 237 mL via ORAL

## 2021-08-21 MED ORDER — BUDESONIDE 0.5 MG/2ML IN SUSP
0.5000 mg | Freq: Two times a day (BID) | RESPIRATORY_TRACT | Status: DC
  Administered 2021-08-21 – 2021-08-23 (×5): 0.5 mg via RESPIRATORY_TRACT
  Filled 2021-08-21 (×5): qty 2

## 2021-08-21 MED ORDER — ARIPIPRAZOLE 5 MG PO TABS
5.0000 mg | ORAL_TABLET | Freq: Every day | ORAL | Status: DC
  Administered 2021-08-21 – 2021-08-23 (×3): 5 mg via ORAL
  Filled 2021-08-21 (×3): qty 1

## 2021-08-21 MED ORDER — ADULT MULTIVITAMIN W/MINERALS CH
1.0000 | ORAL_TABLET | Freq: Every day | ORAL | Status: DC
  Administered 2021-08-21 – 2021-08-23 (×3): 1 via ORAL
  Filled 2021-08-21 (×3): qty 1

## 2021-08-21 MED ORDER — CHLORHEXIDINE GLUCONATE CLOTH 2 % EX PADS
6.0000 | MEDICATED_PAD | Freq: Every day | CUTANEOUS | Status: DC
  Administered 2021-08-21 – 2021-08-22 (×2): 6 via TOPICAL

## 2021-08-21 MED ORDER — TRAZODONE HCL 50 MG PO TABS
200.0000 mg | ORAL_TABLET | Freq: Every day | ORAL | Status: DC
  Administered 2021-08-21 – 2021-08-22 (×3): 200 mg via ORAL
  Filled 2021-08-21 (×3): qty 4

## 2021-08-21 MED ORDER — DEXTROSE 50 % IV SOLN: 50.0000 mL | Freq: Once | INTRAVENOUS | Status: AC

## 2021-08-21 NOTE — Progress Notes (Signed)
Pt admitted to ICU-9 in stable condition, Levophed infusing at 3.8 ml/hr. No c/o pain or discomfort at this time. Bed in lowest position, cal light within reach.

## 2021-08-21 NOTE — Progress Notes (Addendum)
Initial Nutrition Assessment  DOCUMENTATION CODES:   Severe malnutrition in context of chronic illness  INTERVENTION:  Ensure Enlive po BID, each supplement provides 350 kcal and 20 grams of protein   Recommend check vitamin B-12 and Vitamin D  Provide MVI daily  NUTRITION DIAGNOSIS:   Severe Malnutrition related to inability to eat, chronic illness, decreased appetite (patient complains of getting full after a few bites) as evidenced by per patient/family report, energy intake < or equal to 75% for > or equal to 1 month, mild fat depletion, moderate fat depletion, mild muscle depletion, moderate muscle depletion, percent weight loss.   GOAL:  Patient will meet greater than or equal to 90% of their needs  MONITOR:  PO intake, Supplement acceptance, Labs, Weight trends  REASON FOR ASSESSMENT:   Malnutrition Screening Tool    ASSESSMENT: Patient is a 66 yo female with hx of DM2, GERD, COPD, mesenteric artery stenosis s/p stents celiac artery and tobacco use. Presents with hypotension, sepsis and pneumonia. Acute respiratory failure with hypoxia requiring pressor support.   Patient reports ongoing abdominal pain and feeling of fullness after eating only a few bites. She associates the symptoms starting around the time stents was placed. Complains of being weak and unable to stand long enough to prepare food. Her husband prepares her meals or brings in outside food for them.   Encouraged pt to consume nutrient dense foods/beverages throughout the day and include source of protein with each meal. She is able to feed herself.   Patient usually weighs - 150 lb (68 kg). She started losing weight around 6 months ago. Currently 52.2 kg -unplanned significant loss of 23% within the past 6-7 months.   Medications reviewed and include: insulin -novolog, levimir, prednisone, Nicoderm.    BMP Latest Ref Rng & Units 08/21/2021 08/20/2021 06/10/2021  Glucose 70 - 99 mg/dL 329(J) 188(C) 166(A)   BUN 8 - 23 mg/dL 6(L) 7(L) <6(T)  Creatinine 0.44 - 1.00 mg/dL 0.16 0.10 9.32(T)  BUN/Creat Ratio 12 - 28 - - -  Sodium 135 - 145 mmol/L 139 135 130(L)  Potassium 3.5 - 5.1 mmol/L 3.9 4.2 3.6  Chloride 98 - 111 mmol/L 107 99 99  CO2 22 - 32 mmol/L 25 26 24   Calcium 8.9 - 10.3 mg/dL 8.1(L) 9.0 7.6(L)    NUTRITION - FOCUSED PHYSICAL EXAM:  Flowsheet Row Most Recent Value  Orbital Region Mild depletion  Upper Arm Region Moderate depletion  Thoracic and Lumbar Region Mild depletion  Buccal Region No depletion  Temple Region No depletion  Clavicle Bone Region Moderate depletion  Clavicle and Acromion Bone Region Moderate depletion  Dorsal Hand No depletion  Patellar Region Moderate depletion  Anterior Thigh Region Moderate depletion  Posterior Calf Region Mild depletion  Edema (RD Assessment) None  Hair Reviewed  Eyes Reviewed  Mouth Reviewed  [poor dentition]  Skin Reviewed  Nails Reviewed       Diet Order:   Diet Order             Diet heart healthy/carb modified Room service appropriate? Yes; Fluid consistency: Thin  Diet effective now                   EDUCATION NEEDS:  Education needs have been addressed  Skin:  Skin Assessment: Reviewed RN Assessment  Last BM:  Prior to admission  Height:   Ht Readings from Last 1 Encounters:  08/20/21 5\' 3"  (1.6 m)    Weight:   Wt Readings  from Last 1 Encounters:  08/20/21 52.2 kg    Ideal Body Weight:   52 kg  BMI:  Body mass index is 20.37 kg/m.  Estimated Nutritional Needs:   Kcal:  1600-1800  Protein:  75-80 gr  Fluid:  1600 ml daily  Royann Shivers MS,RD,CSG,LDN Contact: Loretha Stapler

## 2021-08-21 NOTE — Care Management Important Message (Signed)
Important Message  Patient Details  Name: Dana Dominguez MRN: [id number] Date of Birth: Nov 15, 1955   Medicare Important Message Given:  Yes     Corey Harold 08/21/2021, 4:20 PM

## 2021-08-21 NOTE — H&P (Signed)
TRH H&P    Patient Demographics:    Dana Dominguez, is a 66 y.o. female  MRN: [id number]  DOB - 1956-04-14  Admit Date - 08/20/2021  Referring MD/NP/PA: Estell Harpin  Outpatient Primary MD for the patient is Joya Salm  Patient coming from: home  Chief complaint-hypotension   HPI:    Dana Dominguez  is a 66 y.o. female, with history of COPD, diabetes mellitus type 2, GERD, high cholesterol, nocturnal oxygen requirement, diabetes mellitus type 2, and more presents ED with a chief complaint of hypertension.  Patient reports that she has been getting GI work-up for GI bleed.  She started seeing intermittent blood in her stool several months ago.  She denies any melena.  She went to the GI doctor today and they sent her to the hospital for hypotension.  Patient reports that she has had weakness and fatigue.  She reports malaise.  Is been going on for months.  Its worse since it started.  She is dizzy upon standing.  She has shortness of breath.  She does wear 3 L nasal cannula at night.  She reports during the days when she is exerting herself she does not think about her shortness of breath.  Therefore the shortness of breath is nonexertional.  She reports mild cough that is nonproductive.  She has not had any fever.  She denies any history of blood clot, cancer, hormones, long trips, hemoptysis.  Patient has no further complaints at this time Patient smokes, but declines nicotine patch at this time She does not drink alcohol, does not use illicit drugs.  She is vaccinated for COVID.  Patient is full code. In the ED Temp 97.6, heart rate 86-108, respiratory rate 15-20, blood pressure 83/50, maintaining oxygen saturations on 3 L nasal cannula No leukocytosis, hemoglobin 12.1, platelets 302 Chemistry panel unremarkable Trope 4 D-dimer 0.30, 0.37 Chest x-ray shows no acute intra thoracic process identified EKG shows a heart rate  of 89, sinus rhythm, QTC 4 and 77 Patient has abnormal ST segments in her EKG, but there are diffuse Albuterol 2.5 mg given x2 Rocephin and Zithromax given DuoNeb given Solu-Medrol 125 mg given Normal saline 2 L bolus Negative respiratory panel Admission requested for hypotension    Review of systems:    In addition to the HPI above,  No Fever-chills, No Headache, No changes with Vision or hearing, No problems swallowing food or Liquids, No Chest pain, No Abdominal pain, No Nausea or Vomiting, bowel movements are regular, No Blood in stool or Urine, No dysuria, No new skin rashes or bruises, No new joints pains-aches,  No new weakness, tingling, numbness in any extremity, No recent weight gain or loss, No polyuria, polydypsia or polyphagia, No significant Mental Stressors.  All other systems reviewed and are negative.    Past History of the following :    Past Medical History:  Diagnosis Date   Back pain    COPD (chronic obstructive pulmonary disease) (HCC)    Diabetes (HCC)    Dysphagia 09/29/2016   GERD (gastroesophageal reflux  disease)    Heart murmur    High cholesterol    On supplemental oxygen therapy    3L [place]  at night      Past Surgical History:  Procedure Laterality Date   BACK SURGERY     x 2 in the past   carotid surgery     December 2015 rt    CHOLECYSTECTOMY     [facility]   COLON SURGERY     for diverticulitis   ESOPHAGEAL DILATION N/A 11/21/2014   Procedure: ESOPHAGEAL DILATION;  Surgeon: Malissa HippoNajeeb U Rehman, MD;  [facility];  Service: Endoscopy;  Laterality: N/A;   ESOPHAGOGASTRODUODENOSCOPY N/A 11/21/2014   Procedure: ESOPHAGOGASTRODUODENOSCOPY (EGD);  Surgeon: Malissa HippoNajeeb U Rehman, MD;  Location: [facility];  Service: Endoscopy;  Laterality: N/A;  1200 - moved to 4/7 @ 9:00   NASAL SINUS SURGERY     NECK SURGERY     stent in left arm     blockage 3 yrs ago.       Social History:      Social History   Tobacco Use   Smoking  status: Every Day    Packs/day: 2.00    Years: 50.00    Pack years: 100.00    Types: Cigarettes   Smokeless tobacco: Never   Tobacco comments:    1 pack day since 12 yrs  Substance Use Topics   Alcohol use: No    Alcohol/week: 0.0 standard drinks       Family History :     Family History  Problem Relation Age of Onset   Cancer - Colon Neg Hx       Home Medications:   Prior to Admission medications   Medication Sig Start Date End Date Taking? Authorizing Provider  ANORO ELLIPTA 62.5-25 MCG/INH AEPB Inhale 1 puff into the lungs daily. 12/18/20  Yes [provider]  ARIPiprazole (ABILIFY) 5 MG tablet Take 5 mg by mouth daily.   Yes [provider]  aspirin EC 81 MG tablet Take 81 mg by mouth daily.   Yes [provider]  atorvastatin (LIPITOR) 80 MG tablet Take 80 mg by mouth daily.    Yes [provider]  buPROPion (WELLBUTRIN XL) 300 MG 24 hr tablet Take 300 mg by mouth daily. 07/28/21  Yes [provider]  busPIRone (BUSPAR) 15 MG tablet Take 15 mg by mouth 3 (three) times daily.   Yes [provider]  clopidogrel (PLAVIX) 75 MG tablet Take 75 mg by mouth daily. 05/05/21  Yes [provider]  desloratadine (CLARINEX) 5 MG tablet Take 5 mg by mouth daily.   Yes [provider]  dexlansoprazole (DEXILANT) 60 MG capsule Take 60 mg by mouth daily.   Yes [provider]  dicyclomine (BENTYL) 10 MG capsule Take 10 mg by mouth 2 (two) times daily.   Yes [provider]  Docusate Sodium (DSS) 100 MG CAPS Take 1 capsule by mouth daily. 05/25/17  Yes [provider]  DULoxetine (CYMBALTA) 60 MG capsule Take 60 mg by mouth 2 (two) times daily.   Yes [provider]  glipiZIDE (GLUCOTROL XL) 5 MG 24 hr tablet TAKE 1 TABLET BY MOUTH EVERY DAY WITH BREAKFAST. NEEDS APPT FOR MORE REFILLS. 07/28/21  Yes Dani Gobbleeardon, Whitney J, NP  hydroxychloroquine (PLAQUENIL) 200 MG tablet Take 200 mg by  mouth daily. 09/01/20  Yes [provider]  hydrOXYzine (ATARAX/VISTARIL) 50 MG tablet Take 50 mg by mouth See admin instructions. Take  1 tablet by mouth twice daily; may also take twice more as needed for anxiety.   Yes [provider]  Insulin Glargine (BASAGLAR KWIKPEN) 100 UNIT/ML Inject 40 Units into the skin at bedtime. Sliding scale 07/28/21  Yes Reardon, Freddi Starr, NP  metFORMIN (GLUCOPHAGE-XR) 500 MG 24 hr tablet TAKE 2 TABLETS BY MOUTH ONCE DAILY WITH BREAKFAST 07/28/21  Yes Reardon, Alphonzo Lemmings J, NP  ondansetron (ZOFRAN) 4 MG tablet Take 1 tablet (4 mg total) by mouth every 6 (six) hours. 01/12/20  Yes Carroll Sage, PA-C  traZODone (DESYREL) 100 MG tablet Take 200 mg by mouth at bedtime.  10/22/14  Yes [provider]  Vitamin D, Ergocalciferol, (DRISDOL) 1.25 MG (50000 UNIT) CAPS capsule Take 1 capsule (50,000 Units total) by mouth every 7 (seven) days. 07/28/21  Yes Dani Gobble, NP  B-D ULTRAFINE III SHORT PEN 31G X 8 MM MISC SMARTSIG:1 Each SUB-Q Daily 06/17/20   Dani Gobble, NP  BD INSULIN SYRINGE U/F 31G X 5/16" 0.3 ML MISC 1 each by Other route at bedtime. 06/16/20   Roma Kayser, MD  Continuous Blood Gluc Sensor (FREESTYLE LIBRE 14 DAY SENSOR) MISC INJECT 1 SENSOR INTO THE SKIN EVERY 14 DAYS AS DIRECTED 07/30/21   Dani Gobble, NP  HYDROcodone-acetaminophen (NORCO/VICODIN) 5-325 MG tablet Take 1 tablet by mouth every 4 (four) hours as needed. Patient not taking: Reported on 08/20/2021 03/25/21   Jacalyn Lefevre, MD  ondansetron (ZOFRAN ODT) 4 MG disintegrating tablet Take 1 tablet (4 mg total) by mouth every 8 (eight) hours as needed for nausea or vomiting. Patient not taking: Reported on 08/20/2021 03/25/21   Jacalyn Lefevre, MD     Allergies:     Allergies  Allergen Reactions   Ticagrelor Shortness Of Breath   Codeine Nausea Only    Dizzy, nausea, cold sweat Other reaction(s): Rash   Pantoprazole Nausea Only    Other  reaction(s): cold sweats   Protonix [Pantoprazole Sodium]     ? nausea   Itraconazole Rash    Major rash Other reaction(s): Itching     Physical Exam:   Vitals  Blood pressure 102/62, pulse 89, temperature 97.6 F (36.4 C), temperature source Oral, resp. rate 18, height 5\' 3"  (1.6 m), weight 52.2 kg, SpO2 100 %.   1.  General: Patient lying supine in bed,  no acute distress   2. Psychiatric: Alert and oriented x 3, mood and behavior normal for situation, pleasant and cooperative with exam   3. Neurologic: Speech and language are normal, face is symmetric, moves all 4 extremities voluntarily, at baseline without acute deficits on limited exam   4. HEENMT:  Head is atraumatic, normocephalic, pupils reactive to light, neck is supple, trachea is midline, mucous membranes are moist   5. Respiratory : Lungs are clear to auscultation bilaterally without wheezing, rhonchi, rales, no cyanosis, no increase in work of breathing or accessory muscle use   6. Cardiovascular : Heart rate normal, rhythm is regular, no murmurs, rubs or gallops, no peripheral edema, peripheral pulses palpated   7. Gastrointestinal:  Abdomen is soft, nondistended, nontender to palpation bowel sounds active, no masses or organomegaly palpated   8. Skin:  Skin is warm, dry and intact without rashes, acute lesions, or ulcers on limited exam   9.Musculoskeletal:  No acute deformities or trauma, no asymmetry in tone, no peripheral edema, peripheral pulses palpated, no tenderness to palpation in the extremities     Data Review:  CBC Recent Labs  Lab 08/20/21 1347  WBC 6.5  HGB 12.1  HCT 47.1*  PLT 302  MCV 80.9  MCH 20.8*  MCHC 25.7*  RDW 34.1*   ------------------------------------------------------------------------------------------------------------------  Results for orders placed or performed during the hospital encounter of 08/20/21 (from the past 48 hour(s))  Basic metabolic panel      Status: Abnormal   Collection Time: 08/20/21  1:47 PM  Result Value Ref Range   Sodium 135 135 - 145 mmol/L   Potassium 4.2 3.5 - 5.1 mmol/L   Chloride 99 98 - 111 mmol/L   CO2 26 22 - 32 mmol/L   Glucose, Bld 130 (H) 70 - 99 mg/dL    Comment: Glucose reference range applies only to samples taken after fasting for at least 8 hours.   BUN 7 (L) 8 - 23 mg/dL   Creatinine, Ser 1.610.64 0.44 - 1.00 mg/dL   Calcium 9.0 8.9 - 09.610.3 mg/dL   GFR, Estimated >04>60 >54>60 mL/min    Comment: (NOTE) Calculated using the CKD-EPI Creatinine Equation (2021)    Anion gap 10 5 - 15    Comment: Performed at [facility], [address] [place], [place] [address]  CBC     Status: Abnormal   Collection Time: 08/20/21  1:47 PM  Result Value Ref Range   WBC 6.5 4.0 - 10.5 K/uL   RBC 5.82 (H) 3.87 - 5.11 MIL/uL   Hemoglobin 12.1 12.0 - 15.0 g/dL   HCT 91.447.1 (H) 78.236.0 - 95.646.0 %   MCV 80.9 80.0 - 100.0 fL   MCH 20.8 (L) 26.0 - 34.0 pg   MCHC 25.7 (L) 30.0 - 36.0 g/dL   RDW 21.334.1 (H) 08.611.5 - 57.815.5 %   Platelets 302 150 - 400 K/uL   nRBC 0.0 0.0 - 0.2 %    Comment: Performed at [facility], [address] [place], [place] [address]  Brain natriuretic peptide     Status: Abnormal   Collection Time: 08/20/21  1:47 PM  Result Value Ref Range   B Natriuretic Peptide 194.0 (H) 0.0 - 100.0 pg/mL    Comment: Performed at [facility], [address] [place], [place] [address]  Troponin I (High Sensitivity)     Status: None   Collection Time: 08/20/21  5:41 PM  Result Value Ref Range   Troponin I (High Sensitivity) 4 <18 ng/L    Comment: (NOTE) Elevated high sensitivity troponin I (hsTnI) values and significant  changes across serial measurements may suggest ACS but many other  chronic and acute conditions are known to elevate hsTnI results.  Refer to the "Links" section for chest pain algorithms and additional  guidance. Performed at [facility], [address] [place], [place] [address]   Lactic acid, plasma      Status: None   Collection Time: 08/20/21  5:41 PM  Result Value Ref Range   Lactic Acid, Venous 0.9 0.5 - 1.9 mmol/L    Comment: Performed at [facility], [address] [place], [place] [address]  D-dimer, quantitative     Status: None   Collection Time: 08/20/21  5:41 PM  Result Value Ref Range   D-Dimer, Quant 0.30 0.00 - 0.50 ug/mL-FEU    Comment: (NOTE) At the manufacturer cut-off value of 0.5 g/mL FEU, this assay has a negative predictive value of 95-100%.This assay is intended for use in conjunction with a clinical pretest probability (PTP) assessment model to exclude pulmonary embolism (PE) and deep venous thrombosis (DVT) in  outpatients suspected of PE or DVT. Results should be correlated with clinical presentation. Performed at [facility], [address], [place], [place] [address]   Troponin I (High Sensitivity)     Status: None   Collection Time: 08/20/21  7:33 PM  Result Value Ref Range   Troponin I (High Sensitivity) 5 <18 ng/L    Comment: (NOTE) Elevated high sensitivity troponin I (hsTnI) values and significant  changes across serial measurements may suggest ACS but many other  chronic and acute conditions are known to elevate hsTnI results.  Refer to the "Links" section for chest pain algorithms and additional  guidance. Performed at [facility], [address] [place], [place] [address]   Culture, blood (single)     Status: None (Preliminary result)   Collection Time: 08/20/21  7:33 PM   Specimen: Right Antecubital; Blood  Result Value Ref Range   Specimen Description RIGHT ANTECUBITAL    Special Requests      BOTTLES DRAWN AEROBIC AND ANAEROBIC Blood Culture adequate volume   Culture      NO GROWTH < 12 HOURS Performed at [facility], [address] [place], [place] [address]    Report Status PENDING   Resp Panel by RT-PCR (Flu A&B, Covid) Nasopharyngeal Swab     Status: None   Collection Time: 08/20/21  8:05 PM   Specimen: Nasopharyngeal Swab;  Nasopharyngeal(NP) swabs in vial transport medium  Result Value Ref Range   SARS Coronavirus 2 by RT PCR NEGATIVE NEGATIVE    Comment: (NOTE) SARS-CoV-2 target nucleic acids are NOT DETECTED.  The SARS-CoV-2 RNA is generally detectable in upper respiratory specimens during the acute phase of infection. The lowest concentration of SARS-CoV-2 viral copies this assay can detect is 138 copies/mL. A negative result does not preclude SARS-Cov-2 infection and should not be used as the sole basis for treatment or other patient management decisions. A negative result may occur with  improper specimen collection/handling, submission of specimen other than nasopharyngeal swab, presence of viral mutation(s) within the areas targeted by this assay, and inadequate number of viral copies(<138 copies/mL). A negative result must be combined with clinical observations, patient history, and epidemiological information. The expected result is Negative.  Fact Sheet for Patients:  BloggerCourse.com  Fact Sheet for Healthcare Providers:  SeriousBroker.it  This test is no t yet approved or cleared by the [place] FDA and  has been authorized for detection and/or diagnosis of SARS-CoV-2 by FDA under an Emergency Use Authorization (EUA). This EUA will remain  in effect (meaning this test can be used) for the duration of the COVID-19 declaration under Section 564(b)(1) of the Act, 21 U.S.C.section 360bbb-3(b)(1), unless the authorization is terminated  or revoked sooner.       Influenza A by PCR NEGATIVE NEGATIVE   Influenza B by PCR NEGATIVE NEGATIVE    Comment: (NOTE) The Xpert Xpress SARS-CoV-2/FLU/RSV plus assay is intended as an aid in the diagnosis of influenza from Nasopharyngeal swab specimens and should not be used as a sole basis for treatment. Nasal washings and aspirates are unacceptable for Xpert Xpress  SARS-CoV-2/FLU/RSV testing.  Fact Sheet for Patients: BloggerCourse.com  Fact Sheet for Healthcare Providers: SeriousBroker.it  This test is not yet approved or cleared by the [place] FDA and has been authorized for detection and/or diagnosis of SARS-CoV-2 by FDA under an Emergency Use Authorization (EUA). This EUA will remain in effect (meaning this test can be used) for the duration of the COVID-19 declaration under Section  564(b)(1) of the Act, 21 U.S.C. section 360bbb-3(b)(1), unless the authorization is terminated or revoked.  Performed at [facility], [address] [place], [place] [address]   Procalcitonin - Baseline     Status: None   Collection Time: 08/20/21  8:27 PM  Result Value Ref Range   Procalcitonin <0.10 ng/mL    Comment:        Interpretation: PCT (Procalcitonin) <= 0.5 ng/mL: Systemic infection (sepsis) is not likely. Local bacterial infection is possible. (NOTE)       Sepsis PCT Algorithm           Lower Respiratory Tract                                      Infection PCT Algorithm    ----------------------------     ----------------------------         PCT < 0.25 ng/mL                PCT < 0.10 ng/mL          Strongly encourage             Strongly discourage   discontinuation of antibiotics    initiation of antibiotics    ----------------------------     -----------------------------       PCT 0.25 - 0.50 ng/mL            PCT 0.10 - 0.25 ng/mL               OR       >80% decrease in PCT            Discourage initiation of                                            antibiotics      Encourage discontinuation           of antibiotics    ----------------------------     -----------------------------         PCT >= 0.50 ng/mL              PCT 0.26 - 0.50 ng/mL               AND        <80% decrease in PCT             Encourage initiation of                                              antibiotics       Encourage continuation           of antibiotics    ----------------------------     -----------------------------        PCT >= 0.50 ng/mL                  PCT > 0.50 ng/mL               AND         increase in PCT                  Strongly encourage  initiation of antibiotics    Strongly encourage escalation           of antibiotics                                     -----------------------------                                           PCT <= 0.25 ng/mL                                                 OR                                        > 80% decrease in PCT                                      Discontinue / Do not initiate                                             antibiotics  Performed at [facility], [address], [place], [place] [address]   Blood gas, venous     Status: Abnormal   Collection Time: 08/20/21  8:27 PM  Result Value Ref Range   FIO2 36.00    pH, Ven 7.247 (L) 7.250 - 7.430   pCO2, Ven 54.9 44.0 - 60.0 mmHg   pO2, Ven 32.7 32.0 - 45.0 mmHg   Bicarbonate 20.2 20.0 - 28.0 mmol/L   Acid-base deficit 3.1 (H) 0.0 - 2.0 mmol/L   O2 Saturation 49.4 %   Patient temperature 36.4    Collection site BLOOD LEFT FOREARM    Drawn by [id number]    Sample type VENOUS     Comment: Performed at [facility], [address] [place], [place] [address]  CBG monitoring, ED     Status: Abnormal   Collection Time: 08/20/21 10:36 PM  Result Value Ref Range   Glucose-Capillary 256 (H) 70 - 99 mg/dL    Comment: Glucose reference range applies only to samples taken after fasting for at least 8 hours.  D-dimer, quantitative     Status: None   Collection Time: 08/21/21 12:37 AM  Result Value Ref Range   D-Dimer, Quant 0.37 0.00 - 0.50 ug/mL-FEU    Comment: (NOTE) At the manufacturer cut-off value of 0.5 g/mL FEU, this assay has a negative predictive value of 95-100%.This assay is intended for use in conjunction with a  clinical pretest probability (PTP) assessment model to exclude pulmonary embolism (PE) and deep venous thrombosis (DVT) in outpatients suspected of PE or DVT. Results should be correlated with clinical presentation. Performed at [facility], [address] [place], [place] [address]   CBG monitoring, ED     Status: Abnormal   Collection Time: 08/21/21  1:27 AM  Result Value Ref Range   Glucose-Capillary 264 (H)  70 - 99 mg/dL    Comment: Glucose reference range applies only to samples taken after fasting for at least 8 hours.    Chemistries  Recent Labs  Lab 08/20/21 1347  NA 135  K 4.2  CL 99  CO2 26  GLUCOSE 130*  BUN 7*  CREATININE 0.64  CALCIUM 9.0   ------------------------------------------------------------------------------------------------------------------  ------------------------------------------------------------------------------------------------------------------ GFR: Estimated Creatinine Clearance: 57.8 mL/min (by C-G formula based on SCr of 0.64 mg/dL). Liver Function Tests: No results for input(s): AST, ALT, ALKPHOS, BILITOT, PROT, ALBUMIN in the last 168 hours. No results for input(s): LIPASE, AMYLASE in the last 168 hours. No results for input(s): AMMONIA in the last 168 hours. Coagulation Profile: No results for input(s): INR, PROTIME in the last 168 hours. Cardiac Enzymes: No results for input(s): CKTOTAL, CKMB, CKMBINDEX, TROPONINI in the last 168 hours. BNP (last 3 results) No results for input(s): PROBNP in the last 8760 hours. HbA1C: No results for input(s): HGBA1C in the last 72 hours. CBG: Recent Labs  Lab 08/20/21 2236 08/21/21 0127  GLUCAP 256* 264*   Lipid Profile: No results for input(s): CHOL, HDL, LDLCALC, TRIG, CHOLHDL, LDLDIRECT in the last 72 hours. Thyroid Function Tests: No results for input(s): TSH, T4TOTAL, FREET4, T3FREE, THYROIDAB in the last 72 hours. Anemia Panel: No results for input(s): VITAMINB12, FOLATE,  FERRITIN, TIBC, IRON, RETICCTPCT in the last 72 hours.  --------------------------------------------------------------------------------------------------------------- Urine analysis:    Component Value Date/Time   COLORURINE YELLOW 03/25/2021 1437   APPEARANCEUR CLEAR 03/25/2021 1437   LABSPEC 1.023 03/25/2021 1437   PHURINE 7.0 03/25/2021 1437   GLUCOSEU >=500 (A) 03/25/2021 1437   HGBUR NEGATIVE 03/25/2021 1437   BILIRUBINUR NEGATIVE 03/25/2021 1437   KETONESUR 20 (A) 03/25/2021 1437   PROTEINUR >=300 (A) 03/25/2021 1437   NITRITE NEGATIVE 03/25/2021 1437   LEUKOCYTESUR NEGATIVE 03/25/2021 1437      Imaging Results:    DG Chest 2 View  Result Date: 08/20/2021 CLINICAL DATA:  Hypotension EXAM: CHEST - 2 VIEW COMPARISON:  Chest x-ray 03/25/2021 FINDINGS: Heart size and mediastinum are stable and within normal limits. Calcified plaques in the aortic arch. Vascular stent graft in the left mediastinum. No focal consolidation, pleural effusion or pneumothorax identified. IMPRESSION: No acute intrathoracic process identified. Electronically Signed   By: Jannifer Hick M.D.   On: 08/20/2021 13:32       Assessment & Plan:    Principal Problem:   CAP (community acquired pneumonia)   Hypotension Initially working diagnosis was for sepsis and pneumonia Patient is a procalcitonin that is undetectable There was concern for PE given hypotension, oxygen requirement-D-dimer is normal Concern for COPD exacerbation-no CO2 retention, O2 sats normal and venous blood gas Unclear etiology of hypotension Start midodrine Levophed ordered but not started at this time as patient is maintaining blood pressure and MAP 65 Bolus 250 mL given in the ED in addition to the 2 L given prior to admission Continue to monitor Acute respiratory failure with hypoxia Patient taken off oxygen s supplementation and oxygen dropped to 77% Concern for COPD exacerbation Continue breathing treatments and  steroid Continue to support with oxygen supplementation as needed Wean off O2 as tolerated Chest x-ray shows no acute intrathoracic process Continue to monitor Diabetes mellitus type 2 Reduced dose of basal insulin Sliding scale coverage-carb modified diet Continue to monitor Tobacco use disorder Counseled on the importance of cessation Continue to monitor Hyperlipidemia Continue Lipitor Mood disorder Continue Abilify, Wellbutrin, BuSpar, Cymbalta, Atarax, trazodone GERD Continue PPI  DVT Prophylaxis-   Heparin - SCDs   AM Labs Ordered, also please review Full Orders  Family Communication: No family at bedside Code Status: Full  Admission status: Inpatient :The appropriate admission status for this patient is INPATIENT. Inpatient status is judged to be reasonable and necessary in order to provide the required intensity of service to ensure the patient's safety. The patient's presenting symptoms, physical exam findings, and initial radiographic and laboratory data in the context of their chronic comorbidities is felt to place them at high risk for further clinical deterioration. Furthermore, it is not anticipated that the patient will be medically stable for discharge from the hospital within 2 midnights of admission. The following factors support the admission status of inpatient.     The patient's presenting symptoms include dyspnea. The worrisome physical exam findings include hypoxia. The chronic co-morbidities include diabetes mellitus type 2, COPD, tobacco use disorder.       * I certify that at the point of admission it is my clinical judgment that the patient will require inpatient hospital care spanning beyond 2 midnights from the point of admission due to high intensity of service, high risk for further deterioration and high frequency of surveillance required.*  Disposition: Anticipated Discharge date 48-72 hours discharge to home  Time spent in minutes :  65   Cythina Mickelsen B Zierle-Ghosh DO

## 2021-08-21 NOTE — Progress Notes (Signed)
Patient seen and examined.  Admitted after midnight secondary to hypotension and COPD exacerbation; patient chronically uses oxygen 3 L at nighttime and has not been requiring oxygen throughout the day.  Evaluation in the ED after taking oxygen off demonstrated hypoxia in the high 70s/low 80s.  No signs of acute infection and undetectable procalcitonin level appreciated.  Please refer to H&P written by Dr. Carren Rang for further info/details on admission.  Plan: -will check cortisol level -follow vital signs -continue midodrine for now and provide treatment to COPD with nebulizer management, steroids, flutter valve and mucolytic meds. -follow clinical response. -wean off o2 sat as tolerated.   Vassie Loll MD [phone]

## 2021-08-21 NOTE — Progress Notes (Signed)
Inpatient Diabetes Program Recommendations  AACE/ADA: New Consensus Statement on Inpatient Glycemic Control  Target Ranges:  Prepandial:   less than 140 mg/dL      Peak postprandial:   less than 180 mg/dL (1-2 hours)      Critically ill patients:  140 - 180 mg/dL    Latest Reference Range & Units 08/20/21 22:36 08/21/21 01:27 08/21/21 08:58  Glucose-Capillary 70 - 99 mg/dL 784 (H) 696 (H) 40 (LL)    Latest Reference Range & Units 07/28/21 14:11  HbA1c, POC (controlled diabetic range) 0.0 - 7.0 % 7.4    Review of Glycemic Control  Diabetes history: DM2 Outpatient Diabetes medications: Basaglar 40 units QHS, Metformin 1000 mg QAM, Glipizide XL 5 mg QAM Current orders for Inpatient glycemic control: Levemir 30 units QHS, Novolog 0-15 units TID with meals, Novolog 0-5 units QHS; Prednisone 40 mg QAM  Inpatient Diabetes Program Recommendations:    Insulin: Fasting glucose 40 mg/dl this morning. Please consider decreasing Levemir to 15 units QHS.  NOTE: In reviewing chart, noted patient sees Percell Boston, NP [facility]) and was last seen 07/28/21. Per office note, "She adjusts her Basaglar dose depending on her night time glucose reading taking anywhere from 0-50 units. She is advised to lower her Basaglar to 50 units SQ nightly and can continue Metformin 1000 mg ER po daily with breakfast and Glipizide 5 mg XL daily with breakfast."   Thanks, Orlando Penner, RN, MSN, CDE Diabetes Coordinator Inpatient Diabetes Program [phone] (Team Pager from 8am to 5pm)

## 2021-08-21 NOTE — ED Notes (Addendum)
Patient's BGL 40..  Mardee Postin R.N. notified

## 2021-08-22 DIAGNOSIS — J40 Bronchitis, not specified as acute or chronic: Secondary | ICD-10-CM

## 2021-08-22 DIAGNOSIS — E2740 Unspecified adrenocortical insufficiency: Secondary | ICD-10-CM

## 2021-08-22 DIAGNOSIS — J9621 Acute and chronic respiratory failure with hypoxia: Secondary | ICD-10-CM

## 2021-08-22 DIAGNOSIS — E43 Unspecified severe protein-calorie malnutrition: Secondary | ICD-10-CM

## 2021-08-22 DIAGNOSIS — Z720 Tobacco use: Secondary | ICD-10-CM

## 2021-08-22 LAB — GLUCOSE, CAPILLARY
Glucose-Capillary: 106 mg/dL — ABNORMAL HIGH (ref 70–99)
Glucose-Capillary: 111 mg/dL — ABNORMAL HIGH (ref 70–99)
Glucose-Capillary: 118 mg/dL — ABNORMAL HIGH (ref 70–99)
Glucose-Capillary: 21 mg/dL — CL (ref 70–99)
Glucose-Capillary: 32 mg/dL — CL (ref 70–99)
Glucose-Capillary: 351 mg/dL — ABNORMAL HIGH (ref 70–99)
Glucose-Capillary: 56 mg/dL — ABNORMAL LOW (ref 70–99)
Glucose-Capillary: 57 mg/dL — ABNORMAL LOW (ref 70–99)
Glucose-Capillary: 93 mg/dL (ref 70–99)
Glucose-Capillary: 94 mg/dL (ref 70–99)

## 2021-08-22 LAB — HEMOGLOBIN A1C
Hgb A1c MFr Bld: 5.6 % (ref 4.8–5.6)
Mean Plasma Glucose: 114 mg/dL

## 2021-08-22 LAB — PROCALCITONIN: Procalcitonin: <0.1 ng/mL

## 2021-08-22 MED ORDER — HYDROCORTISONE 5 MG PO TABS
5.0000 mg | ORAL_TABLET | Freq: Every day | ORAL | Status: DC
  Administered 2021-08-22: 5 mg via ORAL
  Filled 2021-08-22 (×2): qty 1

## 2021-08-22 MED ORDER — DEXTROSE 50 % IV SOLN
INTRAVENOUS | Status: AC
  Administered 2021-08-22: 50 mL via INTRAVENOUS
  Filled 2021-08-22: qty 50

## 2021-08-22 MED ORDER — HYDROCORTISONE 10 MG PO TABS
10.0000 mg | ORAL_TABLET | Freq: Every day | ORAL | Status: DC
  Administered 2021-08-22: 10 mg via ORAL
  Filled 2021-08-22 (×3): qty 2

## 2021-08-22 MED ORDER — DEXTROSE 50 % IV SOLN: 1.0000 | Freq: Once | INTRAVENOUS | Status: AC

## 2021-08-22 MED ORDER — CEFDINIR 300 MG PO CAPS
300.0000 mg | ORAL_CAPSULE | Freq: Two times a day (BID) | ORAL | Status: DC
  Administered 2021-08-22 – 2021-08-23 (×3): 300 mg via ORAL
  Filled 2021-08-22 (×3): qty 1

## 2021-08-22 NOTE — Progress Notes (Signed)
Report called and given to [place], [place] on Dept. [id number]. Pt to be transported via [place] to room 318.

## 2021-08-22 NOTE — Progress Notes (Signed)
Hypoglycemic Event  CBG: 32  Treatment: 4 oz juice/soda & 3 mini chocolate bars & lunch provided.   Symptoms: Shaky  Follow-up CBG: Time:1208 CBG Result:111  Possible Reasons for Event: Medication regimen:    Comments/MD notified: Gwenlyn Perking, MD.    Isaiah Blakes Kaliah Haddaway

## 2021-08-22 NOTE — Progress Notes (Signed)
SATURATION QUALIFICATIONS:  Patient Saturations on Room Air at Rest = 88%   Patient Saturations on 3 Liters of oxygen while Ambulating = 93%  Destated at rest.

## 2021-08-22 NOTE — Progress Notes (Signed)
PROGRESS NOTE    Dana Dominguez  [id number] DOB: 04-Mar-1956 DOA: 08/20/2021 PCP: Joya SalmSeepe, Carolyn    Chief Complaint  Patient presents with   Hypotension    Brief Narrative:  As per H&P written by Dr. Carren RangZierle-Ghosh on 08/22/21  Dana Dominguez  is a 66 y.o. female, with history of COPD, diabetes mellitus type 2, GERD, high cholesterol, nocturnal oxygen requirement, diabetes mellitus type 2, and more presents ED with a chief complaint of hypertension.  Patient reports that she has been getting GI work-up for GI bleed.  She started seeing intermittent blood in her stool several months ago.  She denies any melena.  She went to the GI doctor today and they sent her to the hospital for hypotension.  Patient reports that she has had weakness and fatigue.  She reports malaise.  Is been going on for months.  Its worse since it started.  She is dizzy upon standing.  She has shortness of breath.  She does wear 3 L nasal cannula at night.  She reports during the days when she is exerting herself she does not think about her shortness of breath.  Therefore the shortness of breath is nonexertional.  She reports mild cough that is nonproductive.  She has not had any fever.  She denies any history of blood clot, cancer, hormones, long trips, hemoptysis.  Patient has no further complaints at this time Patient smokes, but declines nicotine patch at this time She does not drink alcohol, does not use illicit drugs.  She is vaccinated for COVID.  Patient is full code. In the ED Temp 97.6, heart rate 86-108, respiratory rate 15-20, blood pressure 83/50, maintaining oxygen saturations on 3 L nasal cannula No leukocytosis, hemoglobin 12.1, platelets 302  Assessment & Plan: 1-acute on chronic respiratory failure with hypoxia due to bronchitis/COPD exacerbation -Patient uses chronic oxygen supplementation (3 L at nighttime).  Requiring oxygen around-the-clock prior to admission. -Continue treatment with steroids,  bronchodilators and oral antibiotics -Flutter valve and mucolytic meds. -Patient advised to quit smoking -Wean off oxygen supplementation as tolerated. -Follow clinical response.  2-hypotension: In the setting of adrenal insufficiency -Patient has been started on midodrine and hydrocortisone -Blood pressure better and no longer requiring pressors. -Patient will be transferred to telemetry bed -Physical therapy evaluation requested. -Positive hypotension and hypoglycemia time of admission suggested condition.  3-type 2 diabetes mellitus with long-term use of insulin and hypoglycemia -Close monitoring of patient's CBGs with adjustment to hypoglycemia regimen as required -A1c 5.6 -Future adjustment will be done to patient's insulin regimen at discharge.  4-tobacco abuse -Cessation counseling provided -Continue nicotine patch.  5-hyperlipidemia -Continue Lipitor  6-gastroesophageal reflux disease -Continue PPI.  7-mood disorder/psychiatric features -Continue Abilify, Wellbutrin, BuSpar, Cymbalta and trazodone. -Overall mood stable currently.  8-severe protein calorie malnutrition -Continue to follow recommendations by dietitian service -Continue vitamin supplements.  9-physical deconditioning -will follow recommendations by PT prior to discharge.   DVT prophylaxis: Heparin Code Status: Full code Family Communication: Son at bedside. Disposition:   Status is: Inpatient   Consultants:  None  Procedures:  See below for x-ray reports.  Antimicrobials: Rocephin Zithromax 08/21/2021>>> 08/22/2021 -Doxycycline   Subjective: Patient reported improvement in her breathing; no chest pain, no nausea, no vomiting.  Feeling stronger and denying lightheadedness or near syncopal events.  Objective: Vitals:   08/22/21 0845 08/22/21 0900 08/22/21 0915 08/22/21 0930  BP: (!) 126/59 (!) 151/67 100/67 116/61  Pulse: 85 84  88  Resp: 14 16 18 18   Temp:  TempSrc:      SpO2:  100% 100%  90%  Weight:      Height:        Intake/Output Summary (Last 24 hours) at 08/22/2021 1004 Last data filed at 08/22/2021 0107 Gross per 24 hour  Intake 431.82 ml  Output --  Net 431.82 ml   Filed Weights   08/20/21 1614  Weight: 52.2 kg    Examination: General exam: Appears calm and comfortable; no chest pain, no nausea, no vomiting. Respiratory system: Positive rhonchi bilaterally; appreciate expiratory wheeze and the patient is using 2-3 L nasal cannula supplementation.  No using accessory muscles on exam. Cardiovascular system: S1 & S2 heard, RRR. No JVD, murmurs, rubs, gallops or clicks. No pedal edema. Gastrointestinal system: Abdomen is nondistended, soft and nontender. No organomegaly or masses felt. Normal bowel sounds heard. Central nervous system: Alert and oriented. No focal neurological deficits. Extremities: No cyanosis or clubbing. Skin: No petechiae. Psychiatry: Judgement and insight appear normal. Mood & affect appropriate.    Data Reviewed: I have personally reviewed following labs and imaging studies  CBC: Recent Labs  Lab 08/20/21 1347 08/21/21 0634  WBC 6.5 3.4*  NEUTROABS  --  1.9  HGB 12.1 9.9*  HCT 47.1* 37.5  MCV 80.9 82.1  PLT 302 250    Basic Metabolic Panel: Recent Labs  Lab 08/20/21 1347 08/21/21 0634  NA 135 139  K 4.2 3.9  CL 99 107  CO2 26 25  GLUCOSE 130* 101*  BUN 7* 6*  CREATININE 0.64 0.50  CALCIUM 9.0 8.1*  MG  --  1.6*    GFR: Estimated Creatinine Clearance: 57.8 mL/min (by C-G formula based on SCr of 0.5 mg/dL).  Liver Function Tests: Recent Labs  Lab 08/21/21 0634  AST 17  ALT 9  ALKPHOS 73  BILITOT 0.3  PROT 6.4*  ALBUMIN 2.3*    CBG: Recent Labs  Lab 08/22/21 0523 08/22/21 0549 08/22/21 0617 08/22/21 0659 08/22/21 0734  GLUCAP 21* 56* 106* 93 118*     Recent Results (from the past 240 hour(s))  Culture, blood (single)     Status: None (Preliminary result)   Collection Time: 08/20/21   7:33 PM   Specimen: Right Antecubital; Blood  Result Value Ref Range Status   Specimen Description RIGHT ANTECUBITAL  Final   Special Requests   Final    BOTTLES DRAWN AEROBIC AND ANAEROBIC Blood Culture adequate volume   Culture   Final    NO GROWTH 2 DAYS Performed at [facility], [address] [place], [place] [address]    Report Status PENDING  Incomplete  Resp Panel by RT-PCR (Flu A&B, Covid) Nasopharyngeal Swab     Status: None   Collection Time: 08/20/21  8:05 PM   Specimen: Nasopharyngeal Swab; Nasopharyngeal(NP) swabs in vial transport medium  Result Value Ref Range Status   SARS Coronavirus 2 by RT PCR NEGATIVE NEGATIVE Final    Comment: (NOTE) SARS-CoV-2 target nucleic acids are NOT DETECTED.  The SARS-CoV-2 RNA is generally detectable in upper respiratory specimens during the acute phase of infection. The lowest concentration of SARS-CoV-2 viral copies this assay can detect is 138 copies/mL. A negative result does not preclude SARS-Cov-2 infection and should not be used as the sole basis for treatment or other patient management decisions. A negative result may occur with  improper specimen collection/handling, submission of specimen other than nasopharyngeal swab, presence of viral mutation(s) within the areas targeted by this assay, and inadequate number of viral  copies(<138 copies/mL). A negative result must be combined with clinical observations, patient history, and epidemiological information. The expected result is Negative.  Fact Sheet for Patients:  BloggerCourse.com  Fact Sheet for Healthcare Providers:  SeriousBroker.it  This test is no t yet approved or cleared by the [place] FDA and  has been authorized for detection and/or diagnosis of SARS-CoV-2 by FDA under an Emergency Use Authorization (EUA). This EUA will remain  in effect (meaning this test can be used) for the duration of the COVID-19  declaration under Section 564(b)(1) of the Act, 21 U.S.C.section 360bbb-3(b)(1), unless the authorization is terminated  or revoked sooner.       Influenza A by PCR NEGATIVE NEGATIVE Final   Influenza B by PCR NEGATIVE NEGATIVE Final    Comment: (NOTE) The Xpert Xpress SARS-CoV-2/FLU/RSV plus assay is intended as an aid in the diagnosis of influenza from Nasopharyngeal swab specimens and should not be used as a sole basis for treatment. Nasal washings and aspirates are unacceptable for Xpert Xpress SARS-CoV-2/FLU/RSV testing.  Fact Sheet for Patients: BloggerCourse.com  Fact Sheet for Healthcare Providers: SeriousBroker.it  This test is not yet approved or cleared by the [place] FDA and has been authorized for detection and/or diagnosis of SARS-CoV-2 by FDA under an Emergency Use Authorization (EUA). This EUA will remain in effect (meaning this test can be used) for the duration of the COVID-19 declaration under Section 564(b)(1) of the Act, 21 U.S.C. section 360bbb-3(b)(1), unless the authorization is terminated or revoked.  Performed at [facility], [address] [place], [place] [address]   MRSA Next Gen by PCR, Nasal     Status: None   Collection Time: 08/21/21  3:00 PM   Specimen: Nasal Mucosa; Nasal Swab  Result Value Ref Range Status   MRSA by PCR Next Gen NOT DETECTED NOT DETECTED Final    Comment: (NOTE) The GeneXpert MRSA Assay (FDA approved for NASAL specimens only), is one component of a comprehensive MRSA colonization surveillance program. It is not intended to diagnose MRSA infection nor to guide or monitor treatment for MRSA infections. Test performance is not FDA approved in patients less than 65 years old. Performed at [facility], [address] [place], [place] [address]          Radiology Studies: DG Chest 2 View  Result Date: 08/20/2021 CLINICAL DATA:  Hypotension EXAM: CHEST - 2 VIEW  COMPARISON:  Chest x-ray 03/25/2021 FINDINGS: Heart size and mediastinum are stable and within normal limits. Calcified plaques in the aortic arch. Vascular stent graft in the left mediastinum. No focal consolidation, pleural effusion or pneumothorax identified. IMPRESSION: No acute intrathoracic process identified. Electronically Signed   By: Jannifer Hick M.D.   On: 08/20/2021 13:32     Scheduled Meds:  ARIPiprazole  5 mg Oral Daily   aspirin EC  81 mg Oral Daily   atorvastatin  80 mg Oral Daily   budesonide (PULMICORT) nebulizer solution  0.5 mg Nebulization BID   buPROPion  300 mg Oral Daily   busPIRone  15 mg Oral TID   cefdinir  300 mg Oral Q12H   Chlorhexidine Gluconate Cloth  6 each Topical Daily   clopidogrel  75 mg Oral Daily   DULoxetine  60 mg Oral BID   feeding supplement  237 mL Oral BID BM   heparin  5,000 Units Subcutaneous Q8H   hydrocortisone  5-10 mg Oral BID   hydrOXYzine  50 mg Oral BID   insulin aspart  0-15 Units Subcutaneous TID WC   insulin aspart  0-5 Units Subcutaneous QHS   insulin detemir  15 Units Subcutaneous QHS   ipratropium-albuterol  3 mL Nebulization TID   midodrine  5 mg Oral TID WC   multivitamin with minerals  1 tablet Oral Daily   nicotine  21 mg Transdermal Daily   pantoprazole  40 mg Oral BID   predniSONE  40 mg Oral Q breakfast   traZODone  200 mg Oral QHS   umeclidinium-vilanterol  1 puff Inhalation Daily   Continuous Infusions:  norepinephrine (LEVOPHED) Adult infusion Stopped (08/22/21 0845)     LOS: 2 days    Vassie Loll, MD [facility]   To contact the attending provider between 7A-7P or the covering provider during after hours 7P-7A, please log into the web site [email or web address] and access using universal [place] password for that web site. If you do not have the password, please call the hospital operator.  08/22/2021, 10:04 AM

## 2021-08-23 DIAGNOSIS — K219 Gastro-esophageal reflux disease without esophagitis: Secondary | ICD-10-CM

## 2021-08-23 DIAGNOSIS — R112 Nausea with vomiting, unspecified: Secondary | ICD-10-CM | POA: Insufficient documentation

## 2021-08-23 DIAGNOSIS — J209 Acute bronchitis, unspecified: Secondary | ICD-10-CM

## 2021-08-23 DIAGNOSIS — F418 Other specified anxiety disorders: Secondary | ICD-10-CM

## 2021-08-23 DIAGNOSIS — J440 Chronic obstructive pulmonary disease with acute lower respiratory infection: Secondary | ICD-10-CM

## 2021-08-23 DIAGNOSIS — R634 Abnormal weight loss: Secondary | ICD-10-CM | POA: Insufficient documentation

## 2021-08-23 DIAGNOSIS — Z794 Long term (current) use of insulin: Secondary | ICD-10-CM

## 2021-08-23 DIAGNOSIS — E2740 Unspecified adrenocortical insufficiency: Secondary | ICD-10-CM

## 2021-08-23 DIAGNOSIS — E11649 Type 2 diabetes mellitus with hypoglycemia without coma: Secondary | ICD-10-CM

## 2021-08-23 DIAGNOSIS — R197 Diarrhea, unspecified: Secondary | ICD-10-CM | POA: Insufficient documentation

## 2021-08-23 DIAGNOSIS — E785 Hyperlipidemia, unspecified: Secondary | ICD-10-CM

## 2021-08-23 LAB — CBC
HCT: 36.8 % (ref 36.0–46.0)
Hemoglobin: 9.6 g/dL — ABNORMAL LOW (ref 12.0–15.0)
MCH: 21.2 pg — ABNORMAL LOW (ref 26.0–34.0)
MCHC: 26.1 g/dL — ABNORMAL LOW (ref 30.0–36.0)
MCV: 81.2 fL (ref 80.0–100.0)
Platelets: 237 10*3/uL (ref 150–400)
RBC: 4.53 MIL/uL (ref 3.87–5.11)
RDW: 32.1 % — ABNORMAL HIGH (ref 11.5–15.5)
WBC: 5.5 10*3/uL (ref 4.0–10.5)
nRBC: 0 % (ref 0.0–0.2)

## 2021-08-23 LAB — BASIC METABOLIC PANEL
Anion gap: 6 (ref 5–15)
BUN: 6 mg/dL — ABNORMAL LOW (ref 8–23)
CO2: 28 mmol/L (ref 22–32)
Calcium: 8.6 mg/dL — ABNORMAL LOW (ref 8.9–10.3)
Chloride: 103 mmol/L (ref 98–111)
Creatinine, Ser: 0.5 mg/dL (ref 0.44–1.00)
GFR, Estimated: >60 mL/min (ref >60)
Glucose, Bld: 189 mg/dL — ABNORMAL HIGH (ref 70–99)
Potassium: 3.8 mmol/L (ref 3.5–5.1)
Sodium: 137 mmol/L (ref 135–145)

## 2021-08-23 LAB — GLUCOSE, CAPILLARY
Glucose-Capillary: 111 mg/dL — ABNORMAL HIGH (ref 70–99)
Glucose-Capillary: 152 mg/dL — ABNORMAL HIGH (ref 70–99)
Glucose-Capillary: 189 mg/dL — ABNORMAL HIGH (ref 70–99)
Glucose-Capillary: 196 mg/dL — ABNORMAL HIGH (ref 70–99)
Glucose-Capillary: 325 mg/dL — ABNORMAL HIGH (ref 70–99)

## 2021-08-23 MED ORDER — IPRATROPIUM-ALBUTEROL 0.5-2.5 (3) MG/3ML IN SOLN: 3.0000 mL | Freq: Two times a day (BID) | RESPIRATORY_TRACT | Status: DC

## 2021-08-23 MED ORDER — HYDROCORTISONE 10 MG PO TABS
10.0000 mg | ORAL_TABLET | Freq: Two times a day (BID) | ORAL | Status: DC
  Administered 2021-08-23: 10 mg via ORAL
  Filled 2021-08-23 (×3): qty 1

## 2021-08-23 MED ORDER — NICOTINE 21 MG/24HR TD PT24: 21.0000 mg | MEDICATED_PATCH | Freq: Every day | TRANSDERMAL | 0 refills | Status: AC

## 2021-08-23 MED ORDER — ENSURE ENLIVE PO LIQD: 237.0000 mL | Freq: Two times a day (BID) | ORAL | 12 refills | Status: AC

## 2021-08-23 MED ORDER — CEFDINIR 300 MG PO CAPS: 300.0000 mg | ORAL_CAPSULE | Freq: Two times a day (BID) | ORAL | 0 refills | Status: AC

## 2021-08-23 MED ORDER — PREDNISONE 20 MG PO TABS: 40.0000 mg | ORAL_TABLET | Freq: Every day | ORAL | 0 refills | Status: AC

## 2021-08-23 MED ORDER — ADULT MULTIVITAMIN W/MINERALS CH: 1.0000 | ORAL_TABLET | Freq: Every day | ORAL | Status: AC

## 2021-08-23 MED ORDER — METFORMIN HCL ER 500 MG PO TB24: 500.0000 mg | ORAL_TABLET | Freq: Every day | ORAL | Status: AC

## 2021-08-23 MED ORDER — HYDROCORTISONE 10 MG PO TABS: 10.0000 mg | ORAL_TABLET | Freq: Two times a day (BID) | ORAL | 1 refills | Status: AC

## 2021-08-23 NOTE — Plan of Care (Signed)
°  Problem: Acute Rehab PT Goals(only PT should resolve) Goal: Patient Will Transfer Sit To/From Stand Outcome: Progressing Flowsheets (Taken 08/23/2021 1244) Patient will transfer sit to/from stand: with supervision Goal: Pt Will Transfer Bed To Chair/Chair To Bed Outcome: Progressing Flowsheets (Taken 08/23/2021 1244) Pt will Transfer Bed to Chair/Chair to Bed: with supervision Goal: Pt Will Ambulate Outcome: Progressing Flowsheets (Taken 08/23/2021 1244) Pt will Ambulate:  > 125 feet  with rolling walker  with supervision Goal: Pt/caregiver will Perform Home Exercise Program Outcome: Progressing Flowsheets (Taken 08/23/2021 1244) Pt/caregiver will Perform Home Exercise Program:  For increased strengthening  For improved balance  With Supervision, verbal cues required/provided   Katina Dung. Hartnett-Rands, MS, PT [place] PT [facility] [phone] 08/23/2021

## 2021-08-23 NOTE — Progress Notes (Signed)
Discharge instruction given to pt and her husband Including fallow up appointment, medication etc,  they verbalize of understand.

## 2021-08-23 NOTE — Progress Notes (Addendum)
Blood glucose has been in low 30's , low 50's and over 300 today (after eating [employer] and drinking sweet drinks) Was 91 at 2200 and held levimir after contacting Dr. Clearence Ped who said to recheck ar 0000 and that result was  50.  Gave juice and peanut butter crackers and will recheck in 20 minutes. Patient states this is unusual and does not fluctuate this much at home

## 2021-08-23 NOTE — Evaluation (Signed)
Physical Therapy Evaluation Patient Details Name: Dana Dominguez MRN: [id number] DOB: 1955/11/24 Today's Date: 08/23/2021  History of Present Illness  As per H&P written by Dr. Carren Rang on 08/22/21   Dana Dominguez  is a 66 y.o. female, with history of COPD, diabetes mellitus type 2, GERD, high cholesterol, nocturnal oxygen requirement, diabetes mellitus type 2, and more presents ED with a chief complaint of hypertension.  Patient reports that she has been getting GI work-up for GI bleed.  She started seeing intermittent blood in her stool several months ago.  She denies any melena.  She went to the GI doctor today and they sent her to the hospital for hypotension.  Patient reports that she has had weakness and fatigue.  She reports malaise.  Is been going on for months.  Its worse since it started.  She is dizzy upon standing.  She has shortness of breath.  She does wear 3 L nasal cannula at night.  She reports during the days when she is exerting herself she does not think about her shortness of breath.  Therefore the shortness of breath is nonexertional.  She reports mild cough that is nonproductive.  She has not had any fever.  She denies any history of blood clot, cancer, hormones, long trips, hemoptysis.  Patient has no further complaints at this time  Patient smokes, but declines nicotine patch at this time.   Clinical Impression  Pt admitted with above diagnosis. Patient agreeable to participation PT evaluation today. Husband present in room throughout session. Patient seated at edge of bed at beginning and end of session. Patient unsteady with transfers and ambulation with SPC. Trialed RW. Patient performed well with cues on use of RW during ambulation and transfers with more steadiness and improved endurance for activity. Encourage patient to utilize RW in home environment to continue to be mobile and safe. Pt currently with functional limitations due to the deficits listed below (see PT Problem List). Pt  will benefit from skilled PT to increase their independence and safety with mobility to allow discharge to the venue listed below.        Recommendations for follow up therapy are one component of a multi-disciplinary discharge planning process, led by the attending physician.  Recommendations may be updated based on patient status, additional functional criteria and insurance authorization.  Follow Up Recommendations No PT follow up    Assistance Recommended at Discharge Intermittent Supervision/Assistance  Patient can return home with the following  A little help with bathing/dressing/bathroom;Assistance with cooking/housework;Assist for transportation;Help with stairs or ramp for entrance    Equipment Recommendations Rolling walker (2 wheels)  Recommendations for Other Services       Functional Status Assessment Patient has had a recent decline in their functional status and demonstrates the ability to make significant improvements in function in a reasonable and predictable amount of time.     Precautions / Restrictions Precautions Precautions: Fall Precaution Comments: patient reports 4 falls over the past six months with legs feeling weak. Restrictions Weight Bearing Restrictions: No      Mobility  Bed Mobility     General bed mobility comments: patient sitting at edge of bed at beginning and end of session    Transfers Overall transfer level: Needs assistance Equipment used: None;Rolling walker (2 wheels)   General transfer comment: somewhat unsteady without assistive device; cues needed with use of RW    Ambulation/Gait Ambulation/Gait assistance: Supervision Gait Distance (Feet): 200 Feet Assistive device: Rolling walker (2 wheels);Straight cane  Gait Pattern/deviations: Step-through pattern;Decreased step length - right;Decreased step length - left;Decreased stride length Gait velocity: decreased     General Gait Details: lsow, unsteady cadence with RW,  more steady and improved pace and endurance with RW; on room air; limited by fatigue  Stairs    Wheelchair Mobility    Modified Rankin (Stroke Patients Only)       Balance Overall balance assessment: Needs assistance Sitting-balance support: No upper extremity supported;Feet supported Sitting balance-Leahy Scale: Good     Standing balance support: Single extremity supported;During functional activity Standing balance-Leahy Scale: Poor Standing balance comment: fair/poor with SPC; fair with RW           Pertinent Vitals/Pain Pain Assessment: 0-10 Pain Score: 8  Pain Location: back and legs Pain Intervention(s): Limited activity within patient's tolerance;Monitored during session    Home Living Family/patient expects to be discharged to:: Private residence Living Arrangements: Spouse/significant other;Other relatives Available Help at Discharge: Family;Available 24 hours/day Type of Home: House Home Access: Stairs to enter Entrance Stairs-Rails: None Entrance Stairs-Number of Steps: 1 Alternate Level Stairs-Number of Steps: 12 to basement Home Layout: Two level;Able to live on main level with bedroom/bathroom;Laundry or work area in [employer] Equipment: Tub bench;Hand held shower head (narrow base quad cane)      Prior Function Prior Level of Function : History of Falls (last six months);Needs assist       Physical Assist : ADLs (physical)   ADLs (physical): IADLs Mobility Comments: household distances without assistive device; occasionally would use [facility] ADLs Comments: laundry in basement     Hand Dominance        Extremity/Trunk Assessment   Upper Extremity Assessment Upper Extremity Assessment: Generalized weakness    Lower Extremity Assessment Lower Extremity Assessment: Generalized weakness    Cervical / Trunk Assessment Cervical / Trunk Assessment: Kyphotic  Communication   Communication: No difficulties  Cognition Arousal/Alertness:  Awake/alert Behavior During Therapy: WFL for tasks assessed/performed Overall Cognitive Status: Within Functional Limits for tasks assessed        General Comments      Exercises     Assessment/Plan    PT Assessment Patient needs continued PT services  PT Problem List Decreased strength;Decreased mobility;Decreased activity tolerance;Decreased knowledge of use of DME;Decreased balance       PT Treatment Interventions DME instruction;Therapeutic exercise;Gait training;Balance training;Neuromuscular re-education;Functional mobility training;Therapeutic activities;Patient/family education    PT Goals (Current goals can be found in the Care Plan section)  Acute Rehab PT Goals Patient Stated Goal: Go home PT Goal Formulation: With patient/family Time For Goal Achievement: 09/06/21 Potential to Achieve Goals: Fair    Frequency Min 3X/week        AM-PAC PT "6 Clicks" Mobility  Outcome Measure Help needed turning from your back to your side while in a flat bed without using bedrails?: None Help needed moving from lying on your back to sitting on the side of a flat bed without using bedrails?: None Help needed moving to and from a bed to a chair (including a wheelchair)?: A Little Help needed standing up from a chair using your arms (e.g., wheelchair or bedside chair)?: A Little Help needed to walk in hospital room?: A Little Help needed climbing 3-5 steps with a railing? : A Little 6 Click Score: 20    End of Session Equipment Utilized During Treatment: Gait belt Activity Tolerance: Patient limited by fatigue Patient left: in bed;with call bell/phone within reach;with family/visitor present (sitting at edge of bed) Nurse  Communication: Mobility status PT Visit Diagnosis: Unsteadiness on feet (R26.81);Muscle weakness (generalized) (M62.81);Other abnormalities of gait and mobility (R26.89)    Time: 4540-98111210-1235 PT Time Calculation (min) (ACUTE ONLY): 25 min   Charges:   PT  Evaluation $PT Eval Low Complexity: 1 Low PT Treatments $Therapeutic Activity: 8-22 mins        Katina DungBarbara D. Hartnett-Rands, MS, PT [facility] [phone]  Britta MccreedyBarbara  Hartnett-Rands 08/23/2021, 12:41 PM

## 2021-08-23 NOTE — Plan of Care (Signed)
Problem: Education: Goal: Knowledge of disease or condition will improve 08/23/2021 1423 by Gust Rung, RN Outcome: Completed/Met 08/23/2021 1423 by Gust Rung, RN Outcome: Not Progressing Goal: Knowledge of the prescribed therapeutic regimen will improve 08/23/2021 1423 by Gust Rung, RN Outcome: Completed/Met 08/23/2021 1423 by Gust Rung, RN Outcome: Not Progressing Goal: Individualized Educational Video(s) 08/23/2021 1423 by Gust Rung, RN Outcome: Completed/Met 08/23/2021 1423 by Gust Rung, RN Outcome: Not Progressing   Problem: Activity: Goal: Ability to tolerate increased activity will improve 08/23/2021 1423 by Gust Rung, RN Outcome: Completed/Met 08/23/2021 1423 by Gust Rung, RN Outcome: Not Progressing Goal: Will verbalize the importance of balancing activity with adequate rest periods 08/23/2021 1423 by Gust Rung, RN Outcome: Completed/Met 08/23/2021 1423 by Gust Rung, RN Outcome: Not Progressing   Problem: Respiratory: Goal: Ability to maintain a clear airway will improve 08/23/2021 1423 by Gust Rung, RN Outcome: Completed/Met 08/23/2021 1423 by Gust Rung, RN Outcome: Not Progressing Goal: Levels of oxygenation will improve 08/23/2021 1423 by Gust Rung, RN Outcome: Completed/Met 08/23/2021 1423 by Gust Rung, RN Outcome: Not Progressing Goal: Ability to maintain adequate ventilation will improve 08/23/2021 1423 by Gust Rung, RN Outcome: Completed/Met 08/23/2021 1423 by Gust Rung, RN Outcome: Not Progressing   Problem: Education: Goal: Knowledge of General Education information will improve Description: Including pain rating scale, medication(s)/side effects and non-pharmacologic comfort measures 08/23/2021 1423 by Gust Rung, RN Outcome: Completed/Met 08/23/2021 1423 by Gust Rung, RN Outcome: Not Progressing   Problem: Health Behavior/Discharge Planning: Goal: Ability to manage  health-related needs will improve 08/23/2021 1423 by Gust Rung, RN Outcome: Completed/Met 08/23/2021 1423 by Gust Rung, RN Outcome: Not Progressing   Problem: Clinical Measurements: Goal: Ability to maintain clinical measurements within normal limits will improve 08/23/2021 1423 by Gust Rung, RN Outcome: Completed/Met 08/23/2021 1423 by Gust Rung, RN Outcome: Not Progressing Goal: Will remain free from infection 08/23/2021 1423 by Gust Rung, RN Outcome: Completed/Met 08/23/2021 1423 by Gust Rung, RN Outcome: Not Progressing Goal: Diagnostic test results will improve 08/23/2021 1423 by Gust Rung, RN Outcome: Completed/Met 08/23/2021 1423 by Gust Rung, RN Outcome: Not Progressing Goal: Respiratory complications will improve 08/23/2021 1423 by Gust Rung, RN Outcome: Completed/Met 08/23/2021 1423 by Gust Rung, RN Outcome: Not Progressing Goal: Cardiovascular complication will be avoided 08/23/2021 1423 by Gust Rung, RN Outcome: Completed/Met 08/23/2021 1423 by Gust Rung, RN Outcome: Not Progressing   Problem: Activity: Goal: Risk for activity intolerance will decrease 08/23/2021 1423 by Gust Rung, RN Outcome: Completed/Met 08/23/2021 1423 by Gust Rung, RN Outcome: Not Progressing   Problem: Nutrition: Goal: Adequate nutrition will be maintained 08/23/2021 1423 by Gust Rung, RN Outcome: Completed/Met 08/23/2021 1423 by Gust Rung, RN Outcome: Not Progressing   Problem: Coping: Goal: Level of anxiety will decrease 08/23/2021 1423 by Gust Rung, RN Outcome: Completed/Met 08/23/2021 1423 by Gust Rung, RN Outcome: Not Progressing   Problem: Elimination: Goal: Will not experience complications related to bowel motility 08/23/2021 1423 by Gust Rung, RN Outcome: Completed/Met 08/23/2021 1423 by Gust Rung, RN Outcome: Not Progressing Goal: Will not experience complications related to urinary  retention 08/23/2021 1423 by Gust Rung, RN Outcome: Completed/Met 08/23/2021 1423 by Gust Rung, RN Outcome: Not Progressing   Problem: Pain Managment: Goal: General experience of comfort will improve 08/23/2021 1423 by  Gust Rung, RN Outcome: Completed/Met 08/23/2021 1423 by Gust Rung, RN Outcome: Not Progressing   Problem: Safety: Goal: Ability to remain free from injury will improve 08/23/2021 1423 by Gust Rung, RN Outcome: Completed/Met 08/23/2021 1423 by Gust Rung, RN Outcome: Not Progressing   Problem: Skin Integrity: Goal: Risk for impaired skin integrity will decrease 08/23/2021 1423 by Gust Rung, RN Outcome: Completed/Met 08/23/2021 1423 by Gust Rung, RN Outcome: Not Progressing

## 2021-08-23 NOTE — Progress Notes (Signed)
Recheck glucose 111 after juice and peanut butter crackers.  Encouraged to eat more crackers and will recheck around 0300

## 2021-08-23 NOTE — Discharge Summary (Addendum)
Physician Discharge Summary  Dana GauzeJanet Dominguez WUJ:[id number] DOB: 01/09/1956 DOA: 08/20/2021  PCP: Dana Dominguez  Admit date: 08/20/2021 Discharge date: 08/23/2021  Time spent: 35 minutes  Recommendations for Outpatient Follow-up:  Reassess blood pressure and further adjust hydrocortisone dose as required. Repeat basic metabolic panel to follow electrolytes and renal function. Close monitoring of patient's CBGs with further adjustment to hypoglycemia regimen as needed. Glipizide and insulin has been discontinue at time of discharge in the setting of hypoglycemia and A1c of 5.6. Continue assisting patient with smoking cessation.  Discharge Diagnoses:  Principal Problem:   CAP (community acquired pneumonia) Active Problems:   Hyperlipidemia   COPD exacerbation (HCC)   Protein-calorie malnutrition, severe   Adrenal insufficiency (HCC)   COPD with acute bronchitis (HCC)   Depression with anxiety   Discharge Condition: Stable and improved.  Discharged home with instruction to follow-up with PCP in 10 days.  CODE STATUS: Full code.  Diet recommendation: Regular diet; continue the use of feeding supplements to further assist with nutrition.  Filed Weights   08/20/21 1614  Weight: 52.2 kg    History of present illness:  As per H&P written by Dr. Carren Dominguez on 08/22/21 Dana Dominguez  is a 66 y.o. female, with history of COPD, diabetes mellitus type 2, GERD, high cholesterol, nocturnal oxygen requirement, diabetes mellitus type 2, and more presents ED with a chief complaint of hypertension.  Patient reports that she has been getting GI work-up for GI bleed.  She started seeing intermittent blood in her stool several months ago.  She denies any melena.  She went to the GI doctor today and they sent her to the hospital for hypotension.  Patient reports that she has had weakness and fatigue.  She reports malaise.  Is been going on for months.  Its worse since it started.  She is dizzy upon standing.   She has shortness of breath.  She does wear 3 L nasal cannula at night.  She reports during the days when she is exerting herself she does not think about her shortness of breath.  Therefore the shortness of breath is nonexertional.  She reports mild cough that is nonproductive.  She has not had any fever.  She denies any history of blood clot, cancer, hormones, long trips, hemoptysis.  Patient has no further complaints at this time Patient smokes, but declines nicotine patch at this time She does not drink alcohol, does not use illicit drugs.  She is vaccinated for COVID.  Patient is full code. In the ED Temp 97.6, heart rate 86-108, respiratory rate 15-20, blood pressure 83/50, maintaining oxygen saturations on 3 L nasal cannula No leukocytosis, hemoglobin 12.1, platelets 302.  Hospital Course:  1-acute on chronic respiratory failure with hypoxia due to bronchitis/CAP and COPD exacerbation -Patient uses chronic oxygen supplementation (3 L at nighttime).  Requiring oxygen around-the-clock prior to admission; at discharge only 2 L needed mainly on exertion.. -Continue treatment with steroids, bronchodilators and oral antibiotics.  Much improved at time of discharge. -Continue the use of flutter valve and mucolytic meds. -Patient advised to quit smoking. -Continue to Wean off oxygen supplementation as tolerated.  Still requiring around 2 L nasal cannula supplementation (mainly on exertion) to maintain saturation. -Follow clinical response.   2-hypotension: In the setting of adrenal insufficiency -Patient has been started on BID hydrocortisone -Blood pressure better and no longer requiring pressors. -Positive hypotension and hypoglycemia at time of admission suggested condition.   3-type 2 diabetes mellitus with long-term use of  insulin and hypoglycemia -Recommending close monitoring of patient's CBGs with further adjustment to hypoglycemia regimen as required -A1c 5.6 -At discharge glipizide  and insulin discontinued.   4-tobacco abuse -Cessation counseling provided -Continue nicotine patch.   5-hyperlipidemia -Continue Lipitor   6-gastroesophageal reflux disease -Continue PPI.   7-mood disorder/psychiatric features -Continue Abilify, Wellbutrin, BuSpar, Cymbalta and trazodone. -Overall mood stable currently.   8-severe protein calorie malnutrition -Continue to follow recommendations by dietitian service -Continue vitamin supplements.   9-physical deconditioning -will arrange HHPT at discharge  -patient received prescription for RW DME.  Procedures: See below for x-ray reports.  Consultations: None  Discharge Exam: Vitals:   08/23/21 0819 08/23/21 0822  BP:    Pulse:    Resp:    Temp:    SpO2: 97% 97%    General: Feeling much better, speaking in full sentences; no nausea, no vomiting, no chest pain.  2 L nasal cannula supplementation with good saturation.  Feeling ready to go home. Cardiovascular: Rate controlled, no rubs, no gallops, no JVD. Respiratory: Improved air movement bilaterally; positive rhonchi, no using accessory muscle. Abdomen: Soft, nontender, distended, positive bowel sounds Extremities: No cyanosis or clubbing.  Discharge Instructions   Discharge Instructions     Discharge instructions   Complete by: As directed    Stop smoking Take medications as prescribed Arrange follow-up with PCP in 10 days Maintain adequate hydration      Allergies as of 08/23/2021       Reactions   Ticagrelor Shortness Of Breath   Codeine Nausea Only   Dizzy, nausea, cold sweat Other reaction(s): Rash   Pantoprazole Nausea Only   Other reaction(s): cold sweats   Protonix [pantoprazole Sodium]    ? nausea   Itraconazole Rash   Major rash Other reaction(s): Itching        Medication List     STOP taking these medications    Basaglar KwikPen 100 UNIT/ML   glipiZIDE 5 MG 24 hr tablet Commonly known as: GLUCOTROL XL    HYDROcodone-acetaminophen 5-325 MG tablet Commonly known as: NORCO/VICODIN       TAKE these medications    Anoro Ellipta 62.5-25 MCG/ACT Aepb Generic drug: umeclidinium-vilanterol Inhale 1 puff into the lungs daily.   ARIPiprazole 5 MG tablet Commonly known as: ABILIFY Take 5 mg by mouth daily.   aspirin EC 81 MG tablet Take 81 mg by mouth daily.   atorvastatin 80 MG tablet Commonly known as: LIPITOR Take 80 mg by mouth daily.   B-D ULTRAFINE III SHORT PEN 31G X 8 MM Misc Generic drug: Insulin Pen Needle SMARTSIG:1 Each SUB-Q Daily   BD Insulin Syringe U/F 31G X 5/16" 0.3 ML Misc Generic drug: Insulin Syringe-Needle U-100 1 each by Other route at bedtime.   buPROPion 300 MG 24 hr tablet Commonly known as: WELLBUTRIN XL Take 300 mg by mouth daily.   busPIRone 15 MG tablet Commonly known as: BUSPAR Take 15 mg by mouth 3 (three) times daily.   cefdinir 300 MG capsule Commonly known as: OMNICEF Take 1 capsule (300 mg total) by mouth every 12 (twelve) hours for 5 days.   clopidogrel 75 MG tablet Commonly known as: PLAVIX Take 75 mg by mouth daily.   desloratadine 5 MG tablet Commonly known as: CLARINEX Take 5 mg by mouth daily.   dexlansoprazole 60 MG capsule Commonly known as: DEXILANT Take 60 mg by mouth daily.   dicyclomine 10 MG capsule Commonly known as: BENTYL Take 10 mg by mouth 2 (two)  times daily.   DSS 100 MG Caps Take 1 capsule by mouth daily.   DULoxetine 60 MG capsule Commonly known as: CYMBALTA Take 60 mg by mouth 2 (two) times daily.   feeding supplement Liqd Take 237 mLs by mouth 2 (two) times daily between meals.   FreeStyle Libre 14 Day Sensor Misc INJECT 1 SENSOR INTO THE SKIN EVERY 14 DAYS AS DIRECTED   hydrocortisone 10 MG tablet Commonly known as: CORTEF Take 1 tablet (10 mg total) by mouth 2 (two) times daily.   hydroxychloroquine 200 MG tablet Commonly known as: PLAQUENIL Take 200 mg by mouth daily.   hydrOXYzine 50  MG tablet Commonly known as: ATARAX Take 50 mg by mouth See admin instructions. Take 1 tablet by mouth twice daily; may also take twice more as needed for anxiety.   metFORMIN 500 MG 24 hr tablet Commonly known as: GLUCOPHAGE-XR Take 1 tablet (500 mg total) by mouth daily with breakfast. TAKE 2 TABLETS BY MOUTH ONCE DAILY WITH BREAKFAST What changed:  how much to take how to take this when to take this   multivitamin with minerals Tabs tablet Take 1 tablet by mouth daily. Start taking on: August 24, 2021   nicotine 21 mg/24hr patch Commonly known as: NICODERM CQ - dosed in mg/24 hours Place 1 patch (21 mg total) onto the skin daily. Start taking on: August 24, 2021   ondansetron 4 MG disintegrating tablet Commonly known as: Zofran ODT Take 1 tablet (4 mg total) by mouth every 8 (eight) hours as needed for nausea or vomiting.   ondansetron 4 MG tablet Commonly known as: ZOFRAN Take 1 tablet (4 mg total) by mouth every 6 (six) hours.   predniSONE 20 MG tablet Commonly known as: DELTASONE Take 2 tablets (40 mg total) by mouth daily with breakfast for 3 days. Start taking on: August 24, 2021   traZODone 100 MG tablet Commonly known as: DESYREL Take 200 mg by mouth at bedtime.   Vitamin D (Ergocalciferol) 1.25 MG (50000 UNIT) Caps capsule Commonly known as: DRISDOL Take 1 capsule (50,000 Units total) by mouth every 7 (seven) days.               Durable Medical Equipment  (From admission, onward)           Start     Ordered   08/23/21 1300  For home use only DME Walker rolling  Once       Question Answer Comment  Walker: With 5 Inch Wheels   Patient needs a walker to treat with the following condition Physical deconditioning      08/23/21 1300           Allergies  Allergen Reactions   Ticagrelor Shortness Of Breath   Codeine Nausea Only    Dizzy, nausea, cold sweat Other reaction(s): Rash   Pantoprazole Nausea Only    Other reaction(s): cold  sweats   Protonix [Pantoprazole Sodium]     ? nausea   Itraconazole Rash    Major rash Other reaction(s): Itching    Follow-up Information     Dana Salm. Schedule an appointment as soon as possible for a visit in 10 day(s).   Specialty: Family Medicine Contact information: [address] [place] [address] [phone]                 The results of significant diagnostics from this hospitalization (including imaging, microbiology, ancillary and laboratory) are listed below for reference.  Significant Diagnostic Studies: DG Chest 2 View  Result Date: 08/20/2021 CLINICAL DATA:  Hypotension EXAM: CHEST - 2 VIEW COMPARISON:  Chest x-ray 03/25/2021 FINDINGS: Heart size and mediastinum are stable and within normal limits. Calcified plaques in the aortic arch. Vascular stent graft in the left mediastinum. No focal consolidation, pleural effusion or pneumothorax identified. IMPRESSION: No acute intrathoracic process identified. Electronically Signed   By: Jannifer Hick M.D.   On: 08/20/2021 13:32    Microbiology: Recent Results (from the past 240 hour(s))  Culture, blood (single)     Status: None (Preliminary result)   Collection Time: 08/20/21  7:33 PM   Specimen: Right Antecubital; Blood  Result Value Ref Range Status   Specimen Description RIGHT ANTECUBITAL  Final   Special Requests   Final    BOTTLES DRAWN AEROBIC AND ANAEROBIC Blood Culture adequate volume   Culture   Final    NO GROWTH 3 DAYS Performed at [facility], [address], [place], [place] [address]    Report Status PENDING  Incomplete  Resp Panel by RT-PCR (Flu A&B, Covid) Nasopharyngeal Swab     Status: None   Collection Time: 08/20/21  8:05 PM   Specimen: Nasopharyngeal Swab; Nasopharyngeal(NP) swabs in vial transport medium  Result Value Ref Range Status   SARS Coronavirus 2 by RT PCR NEGATIVE NEGATIVE Final    Comment: (NOTE) SARS-CoV-2 target nucleic acids are NOT DETECTED.  The  SARS-CoV-2 RNA is generally detectable in upper respiratory specimens during the acute phase of infection. The lowest concentration of SARS-CoV-2 viral copies this assay can detect is 138 copies/mL. A negative result does not preclude SARS-Cov-2 infection and should not be used as the sole basis for treatment or other patient management decisions. A negative result may occur with  improper specimen collection/handling, submission of specimen other than nasopharyngeal swab, presence of viral mutation(s) within the areas targeted by this assay, and inadequate number of viral copies(<138 copies/mL). A negative result must be combined with clinical observations, patient history, and epidemiological information. The expected result is Negative.  Fact Sheet for Patients:  BloggerCourse.com  Fact Sheet for Healthcare Providers:  SeriousBroker.it  This test is no t yet approved or cleared by the [place] FDA and  has been authorized for detection and/or diagnosis of SARS-CoV-2 by FDA under an Emergency Use Authorization (EUA). This EUA will remain  in effect (meaning this test can be used) for the duration of the COVID-19 declaration under Section 564(b)(1) of the Act, 21 U.S.C.section 360bbb-3(b)(1), unless the authorization is terminated  or revoked sooner.       Influenza A by PCR NEGATIVE NEGATIVE Final   Influenza B by PCR NEGATIVE NEGATIVE Final    Comment: (NOTE) The Xpert Xpress SARS-CoV-2/FLU/RSV plus assay is intended as an aid in the diagnosis of influenza from Nasopharyngeal swab specimens and should not be used as a sole basis for treatment. Nasal washings and aspirates are unacceptable for Xpert Xpress SARS-CoV-2/FLU/RSV testing.  Fact Sheet for Patients: BloggerCourse.com  Fact Sheet for Healthcare Providers: SeriousBroker.it  This test is not yet approved or  cleared by the [place] FDA and has been authorized for detection and/or diagnosis of SARS-CoV-2 by FDA under an Emergency Use Authorization (EUA). This EUA will remain in effect (meaning this test can be used) for the duration of the COVID-19 declaration under Section 564(b)(1) of the Act, 21 U.S.C. section 360bbb-3(b)(1), unless the authorization is terminated or revoked.  Performed at [facility], [address]  [place], [place] [address]   MRSA Next Gen by PCR, Nasal     Status: None   Collection Time: 08/21/21  3:00 PM   Specimen: Nasal Mucosa; Nasal Swab  Result Value Ref Range Status   MRSA by PCR Next Gen NOT DETECTED NOT DETECTED Final    Comment: (NOTE) The GeneXpert MRSA Assay (FDA approved for NASAL specimens only), is one component of a comprehensive MRSA colonization surveillance program. It is not intended to diagnose MRSA infection nor to guide or monitor treatment for MRSA infections. Test performance is not FDA approved in patients less than 46 years old. Performed at [facility], [address], [place], [place] [address]      Labs: Basic Metabolic Panel: Recent Labs  Lab 08/20/21 1347 08/21/21 0634 08/23/21 0501  NA 135 139 137  K 4.2 3.9 3.8  CL 99 107 103  CO2 26 25 28   GLUCOSE 130* 101* 189*  BUN 7* 6* 6*  CREATININE 0.64 0.50 0.50  CALCIUM 9.0 8.1* 8.6*  MG  --  1.6*  --    Liver Function Tests: Recent Labs  Lab 08/21/21 0634  AST 17  ALT 9  ALKPHOS 73  BILITOT 0.3  PROT 6.4*  ALBUMIN 2.3*   CBC: Recent Labs  Lab 08/20/21 1347 08/21/21 0634 08/23/21 0501  WBC 6.5 3.4* 5.5  NEUTROABS  --  1.9  --   HGB 12.1 9.9* 9.6*  HCT 47.1* 37.5 36.8  MCV 80.9 82.1 81.2  PLT 302 250 237   BNP: BNP (last 3 results) Recent Labs    08/20/21 1347  BNP 194.0*   CBG: Recent Labs  Lab 08/23/21 0044 08/23/21 0315 08/23/21 0528 08/23/21 0800 08/23/21 1124  GLUCAP 111* 196* 189* 152* 325*    Signed:  10/21/21 MD.  [facility] 08/23/2021, 1:02 PM

## 2021-08-24 ENCOUNTER — Telehealth (INDEPENDENT_AMBULATORY_CARE_PROVIDER_SITE_OTHER): Payer: Self-pay | Admitting: Gastroenterology

## 2021-08-24 ENCOUNTER — Other Ambulatory Visit (INDEPENDENT_AMBULATORY_CARE_PROVIDER_SITE_OTHER): Payer: Self-pay | Admitting: Gastroenterology

## 2021-08-24 DIAGNOSIS — D649 Anemia, unspecified: Secondary | ICD-10-CM

## 2021-08-24 DIAGNOSIS — E440 Moderate protein-calorie malnutrition: Secondary | ICD-10-CM

## 2021-08-24 NOTE — Telephone Encounter (Addendum)
Left message to return call 

## 2021-08-24 NOTE — Telephone Encounter (Signed)
Patient left voice mail message stating she is calliing to find out when her EGD/colonoscopy is scheduled - please advise - ph# [phone]

## 2021-08-25 LAB — CULTURE, BLOOD (SINGLE)
Culture: NO GROWTH
Special Requests: ADEQUATE

## 2021-08-25 NOTE — Telephone Encounter (Signed)
Left message to return call 

## 2021-08-25 NOTE — Telephone Encounter (Signed)
Spoke with patient. She states to mail her the bloodwork orders and she will get it done. I placed orders in the mail and she does want to go ahead and schedule EGD and TCS. Pt aware Benedetto Goad will call back to schedule. She wanted to go over dates before scheduling.

## 2021-08-26 ENCOUNTER — Telehealth (INDEPENDENT_AMBULATORY_CARE_PROVIDER_SITE_OTHER): Payer: Self-pay

## 2021-08-26 ENCOUNTER — Other Ambulatory Visit (INDEPENDENT_AMBULATORY_CARE_PROVIDER_SITE_OTHER): Payer: Self-pay

## 2021-08-26 ENCOUNTER — Encounter (INDEPENDENT_AMBULATORY_CARE_PROVIDER_SITE_OTHER): Payer: Self-pay

## 2021-08-26 DIAGNOSIS — R112 Nausea with vomiting, unspecified: Secondary | ICD-10-CM

## 2021-08-26 DIAGNOSIS — R634 Abnormal weight loss: Secondary | ICD-10-CM

## 2021-08-26 DIAGNOSIS — R1310 Dysphagia, unspecified: Secondary | ICD-10-CM

## 2021-08-26 DIAGNOSIS — D649 Anemia, unspecified: Secondary | ICD-10-CM

## 2021-08-26 DIAGNOSIS — R197 Diarrhea, unspecified: Secondary | ICD-10-CM

## 2021-08-26 MED ORDER — PEG 3350-KCL-NA BICARB-NACL 420 G PO SOLR: 4000.0000 mL | ORAL | 0 refills | Status: DC

## 2021-08-26 NOTE — Telephone Encounter (Signed)
Dana Dominguez [name], CMA  ?

## 2021-08-26 NOTE — Telephone Encounter (Signed)
Patient called the office again to find out when her procedure will be scheduled - please advise - ph# [phone]

## 2021-08-27 ENCOUNTER — Encounter (INDEPENDENT_AMBULATORY_CARE_PROVIDER_SITE_OTHER): Payer: Self-pay | Admitting: *Deleted

## 2021-09-02 ENCOUNTER — Telehealth (INDEPENDENT_AMBULATORY_CARE_PROVIDER_SITE_OTHER): Payer: Self-pay

## 2021-09-02 NOTE — Telephone Encounter (Signed)
Dr.Blazing faxed over permission for Dana Dominguez to Hold Plavix 5 days prior to her procedure, she can restart post procedure

## 2021-09-02 NOTE — Telephone Encounter (Signed)
Thanks, please inform her about these changes

## 2021-09-07 ENCOUNTER — Other Ambulatory Visit: Payer: Self-pay | Admitting: Gastroenterology

## 2021-09-08 LAB — IRON AND TIBC
Iron Saturation: 10 % — ABNORMAL LOW (ref 15–55)
Iron: 25 ug/dL — ABNORMAL LOW (ref 27–139)
Total Iron Binding Capacity: 244 ug/dL — ABNORMAL LOW (ref 250–450)
UIBC: 219 ug/dL (ref 118–369)

## 2021-09-08 LAB — B12 AND FOLATE PANEL
Folate: 6.3 ng/mL (ref >3.0)
Vitamin B-12: 636 pg/mL (ref 232–1245)

## 2021-09-10 ENCOUNTER — Telehealth (INDEPENDENT_AMBULATORY_CARE_PROVIDER_SITE_OTHER): Payer: Self-pay

## 2021-09-10 MED ORDER — PEG 3350-KCL-NA BICARB-NACL 420 G PO SOLR: 4000.0000 mL | ORAL | 0 refills | Status: DC

## 2021-09-10 NOTE — Telephone Encounter (Signed)
Dana Dominguez Dana Dominguez, CMA  ?

## 2021-09-30 ENCOUNTER — Ambulatory Visit (HOSPITAL_BASED_OUTPATIENT_CLINIC_OR_DEPARTMENT_OTHER): Payer: Medicare Other | Admitting: Certified Registered Nurse Anesthetist

## 2021-09-30 ENCOUNTER — Encounter (HOSPITAL_COMMUNITY)
Admission: RE | Disposition: A | Discharge status: Home / Self Care | Payer: Self-pay | Source: Home / Self Care | Attending: Gastroenterology

## 2021-09-30 ENCOUNTER — Other Ambulatory Visit: Payer: Self-pay

## 2021-09-30 ENCOUNTER — Encounter (HOSPITAL_COMMUNITY): Payer: Self-pay | Admitting: Gastroenterology

## 2021-09-30 ENCOUNTER — Ambulatory Visit (HOSPITAL_COMMUNITY): Payer: Medicare Other | Admitting: Certified Registered Nurse Anesthetist

## 2021-09-30 ENCOUNTER — Ambulatory Visit (HOSPITAL_COMMUNITY)
Admission: RE | Admit: 2021-09-30 | Discharge: 2021-09-30 | Disposition: A | Discharge status: Home / Self Care | Payer: Medicare Other | Attending: Gastroenterology | Admitting: Gastroenterology

## 2021-09-30 DIAGNOSIS — K319 Disease of stomach and duodenum, unspecified: Secondary | ICD-10-CM | POA: Insufficient documentation

## 2021-09-30 DIAGNOSIS — I10 Essential (primary) hypertension: Secondary | ICD-10-CM | POA: Insufficient documentation

## 2021-09-30 DIAGNOSIS — E1165 Type 2 diabetes mellitus with hyperglycemia: Secondary | ICD-10-CM | POA: Diagnosis not present

## 2021-09-30 DIAGNOSIS — K222 Esophageal obstruction: Secondary | ICD-10-CM

## 2021-09-30 DIAGNOSIS — R1310 Dysphagia, unspecified: Secondary | ICD-10-CM | POA: Diagnosis present

## 2021-09-30 DIAGNOSIS — J449 Chronic obstructive pulmonary disease, unspecified: Secondary | ICD-10-CM | POA: Insufficient documentation

## 2021-09-30 DIAGNOSIS — Z7982 Long term (current) use of aspirin: Secondary | ICD-10-CM | POA: Diagnosis not present

## 2021-09-30 DIAGNOSIS — Z980 Intestinal bypass and anastomosis status: Secondary | ICD-10-CM | POA: Insufficient documentation

## 2021-09-30 DIAGNOSIS — D649 Anemia, unspecified: Secondary | ICD-10-CM

## 2021-09-30 DIAGNOSIS — K264 Chronic or unspecified duodenal ulcer with hemorrhage: Secondary | ICD-10-CM | POA: Diagnosis not present

## 2021-09-30 DIAGNOSIS — R634 Abnormal weight loss: Secondary | ICD-10-CM

## 2021-09-30 DIAGNOSIS — Z7984 Long term (current) use of oral hypoglycemic drugs: Secondary | ICD-10-CM | POA: Insufficient documentation

## 2021-09-30 DIAGNOSIS — K2971 Gastritis, unspecified, with bleeding: Secondary | ICD-10-CM

## 2021-09-30 DIAGNOSIS — F17210 Nicotine dependence, cigarettes, uncomplicated: Secondary | ICD-10-CM | POA: Insufficient documentation

## 2021-09-30 DIAGNOSIS — R197 Diarrhea, unspecified: Secondary | ICD-10-CM | POA: Diagnosis present

## 2021-09-30 DIAGNOSIS — K625 Hemorrhage of anus and rectum: Secondary | ICD-10-CM | POA: Diagnosis not present

## 2021-09-30 DIAGNOSIS — K219 Gastro-esophageal reflux disease without esophagitis: Secondary | ICD-10-CM | POA: Diagnosis not present

## 2021-09-30 DIAGNOSIS — K551 Chronic vascular disorders of intestine: Secondary | ICD-10-CM | POA: Insufficient documentation

## 2021-09-30 DIAGNOSIS — K529 Noninfective gastroenteritis and colitis, unspecified: Secondary | ICD-10-CM

## 2021-09-30 DIAGNOSIS — R112 Nausea with vomiting, unspecified: Secondary | ICD-10-CM

## 2021-09-30 DIAGNOSIS — K921 Melena: Secondary | ICD-10-CM | POA: Diagnosis not present

## 2021-09-30 DIAGNOSIS — K2970 Gastritis, unspecified, without bleeding: Secondary | ICD-10-CM | POA: Insufficient documentation

## 2021-09-30 DIAGNOSIS — Z9981 Dependence on supplemental oxygen: Secondary | ICD-10-CM | POA: Insufficient documentation

## 2021-09-30 DIAGNOSIS — K5730 Diverticulosis of large intestine without perforation or abscess without bleeding: Secondary | ICD-10-CM

## 2021-09-30 DIAGNOSIS — K269 Duodenal ulcer, unspecified as acute or chronic, without hemorrhage or perforation: Secondary | ICD-10-CM | POA: Insufficient documentation

## 2021-09-30 DIAGNOSIS — E7800 Pure hypercholesterolemia, unspecified: Secondary | ICD-10-CM | POA: Diagnosis not present

## 2021-09-30 DIAGNOSIS — R109 Unspecified abdominal pain: Secondary | ICD-10-CM | POA: Diagnosis present

## 2021-09-30 HISTORY — PX: ESOPHAGOGASTRODUODENOSCOPY (EGD) WITH PROPOFOL: SHX5813

## 2021-09-30 HISTORY — PX: BIOPSY: SHX5522

## 2021-09-30 HISTORY — PX: SAVORY DILATION: SHX5439

## 2021-09-30 HISTORY — PX: COLONOSCOPY WITH PROPOFOL: SHX5780

## 2021-09-30 LAB — HM COLONOSCOPY

## 2021-09-30 LAB — GLUCOSE, CAPILLARY: Glucose-Capillary: 202 mg/dL — ABNORMAL HIGH (ref 70–99)

## 2021-09-30 SURGERY — COLONOSCOPY WITH PROPOFOL
Anesthesia: General

## 2021-09-30 MED ORDER — LACTATED RINGERS IV SOLN: INTRAVENOUS | Status: DC

## 2021-09-30 MED ORDER — LACTATED RINGERS IV SOLN: INTRAVENOUS | Status: DC | PRN

## 2021-09-30 MED ORDER — PROPOFOL 500 MG/50ML IV EMUL
INTRAVENOUS | Status: DC | PRN
  Administered 2021-09-30: 125 ug/kg/min via INTRAVENOUS

## 2021-09-30 MED ORDER — LIDOCAINE HCL (CARDIAC) PF 100 MG/5ML IV SOSY
PREFILLED_SYRINGE | INTRAVENOUS | Status: DC | PRN
  Administered 2021-09-30: 30 mg via INTRAVENOUS

## 2021-09-30 MED ORDER — STERILE WATER FOR IRRIGATION IR SOLN
Status: DC | PRN
  Administered 2021-09-30 (×2): 60 mL

## 2021-09-30 MED ORDER — PHENYLEPHRINE HCL (PRESSORS) 10 MG/ML IV SOLN
INTRAVENOUS | Status: DC | PRN
  Administered 2021-09-30 (×2): 80 ug via INTRAVENOUS

## 2021-09-30 MED ORDER — PROPOFOL 10 MG/ML IV BOLUS
INTRAVENOUS | Status: DC | PRN
Start: 2021-09-30 — End: 2021-09-30
  Administered 2021-09-30: 70 mg via INTRAVENOUS
  Administered 2021-09-30: 20 mg via INTRAVENOUS

## 2021-09-30 NOTE — H&P (Signed)
Dana Dominguez is an 66 y.o. female.   Chief Complaint: Diarrhea, abdominal pain, dysphagia, weight loss HPI: 66 year old female with past medical history of COPD (3L supplemental O2 at night), DM, GERD,  High cholesterol, diverticulitis, mesenteric artery stenosis who comes to the hospital for evaluation of diarrhea, abdominal pain, dysphagia, weight loss and occasional episodes of rectal bleeding.  Last seen on 08/20/2021, she was presenting recurrent symptoms described above with poor control of the pain and significant weight loss of close to 40 pounds since May 2022.  Notably, she had an arteriogram on 09/25/2021 that showed the following findings: 1. 90% ostial celiac artery with otherwise patent stent in the proximal vessel. 2. 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 3. 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  Recommendations were 1. Sheath out by manual compression once the ACT is less than 200 sec. 2. Plan to have her return for Shock wave balloon of the ostial celiac followed by repeat drug coated balloon vs. biliary stent. 3. Consider recanalization of the SMA with laser atherectomy.  4. Daily aspirin until she can return in the next couple of weeks.     Past Medical History:  Diagnosis Date   Back pain    COPD (chronic obstructive pulmonary disease) (HCC)    Diabetes (HCC)    Dysphagia 09/29/2016   GERD (gastroesophageal reflux disease)    Heart murmur    High cholesterol    On supplemental oxygen therapy    3L [place]  at night    Past Surgical History:  Procedure Laterality Date   BACK SURGERY     x 2 in the past   carotid surgery     December 2015 rt    CHOLECYSTECTOMY     [facility]   COLON SURGERY     for diverticulitis   ESOPHAGEAL DILATION N/A 11/21/2014   Procedure: ESOPHAGEAL DILATION;  Surgeon: Malissa Hippo, MD;  Location: [facility];  Service: Endoscopy;  Laterality: N/A;    ESOPHAGOGASTRODUODENOSCOPY N/A 11/21/2014   Procedure: ESOPHAGOGASTRODUODENOSCOPY (EGD);  Surgeon: Malissa Hippo, MD;  Location: [facility];  Service: Endoscopy;  Laterality: N/A;  1200 - moved to 4/7 @ 9:00   mesenteric stent  05/2021   NASAL SINUS SURGERY     NECK SURGERY     stent in left arm     blockage 3 yrs ago.     Family History  Problem Relation Age of Onset   Cancer - Colon Neg Hx    Social History:  reports that she has been smoking cigarettes. She has a 100.00 pack-year smoking history. She has never used smokeless tobacco. She reports that she does not drink alcohol and does not use drugs.  Allergies:  Allergies  Allergen Reactions   Ticagrelor Shortness Of Breath   Pantoprazole Nausea Only    Other reaction(s): cold sweats   Protonix [Pantoprazole Sodium] Nausea Only   Codeine Nausea Only and Rash    Dizzy, cold sweat    Itraconazole Itching and Rash    Major rash     Medications Prior to Admission  Medication Sig Dispense Refill   ARIPiprazole (ABILIFY) 5 MG tablet Take 5 mg by mouth daily.     aspirin EC 81 MG tablet Take 81 mg by mouth daily.     atorvastatin (LIPITOR) 80 MG tablet Take 80 mg by mouth daily.      busPIRone (BUSPAR) 15  MG tablet Take 15 mg by mouth 3 (three) times daily.     clopidogrel (PLAVIX) 75 MG tablet Take 75 mg by mouth daily.     desloratadine (CLARINEX) 5 MG tablet Take 5 mg by mouth daily.     dexlansoprazole (DEXILANT) 60 MG capsule Take 60 mg by mouth daily.     dicyclomine (BENTYL) 10 MG capsule Take 10 mg by mouth 2 (two) times daily.     Docusate Sodium (DSS) 100 MG CAPS Take 200 mg by mouth 2 (two) times daily.     DULoxetine (CYMBALTA) 60 MG capsule Take 60 mg by mouth 2 (two) times daily.     glipiZIDE (GLUCOTROL XL) 5 MG 24 hr tablet Take 5 mg by mouth daily.     hydrOXYzine (ATARAX/VISTARIL) 50 MG tablet Take 50 mg by mouth 2 (two) times daily.     metFORMIN (GLUCOPHAGE-XR) 500 MG 24 hr tablet Take 1 tablet (500  mg total) by mouth daily with breakfast. TAKE 2 TABLETS BY MOUTH ONCE DAILY WITH BREAKFAST (Patient taking differently: Take 1,000 mg by mouth daily with breakfast.)     midodrine (PROAMATINE) 2.5 MG tablet Take 2.5 mg by mouth 2 (two) times daily.     nicotine (NICODERM CQ - DOSED IN MG/24 HOURS) 21 mg/24hr patch Place 1 patch (21 mg total) onto the skin daily. 28 patch 0   polyethylene glycol-electrolytes (TRILYTE) 420 g solution Take 4,000 mLs by mouth as directed. 4000 mL 0   traZODone (DESYREL) 100 MG tablet Take 200 mg by mouth at bedtime.      Vitamin D, Ergocalciferol, (DRISDOL) 1.25 MG (50000 UNIT) CAPS capsule Take 1 capsule (50,000 Units total) by mouth every 7 (seven) days. 12 capsule 3   B-D ULTRAFINE III SHORT PEN 31G X 8 MM MISC SMARTSIG:1 Each SUB-Q Daily 100 each 1   BD INSULIN SYRINGE U/F 31G X 5/16" 0.3 ML MISC 1 each by Other route at bedtime. 100 each 5   Continuous Blood Gluc Sensor (FREESTYLE LIBRE 14 DAY SENSOR) MISC INJECT 1 SENSOR INTO THE SKIN EVERY 14 DAYS AS DIRECTED 2 each 2   feeding supplement (ENSURE ENLIVE / ENSURE PLUS) LIQD Take 237 mLs by mouth 2 (two) times daily between meals. (Patient not taking: Reported on 09/24/2021) 237 mL 12   hydrocortisone (CORTEF) 10 MG tablet Take 1 tablet (10 mg total) by mouth 2 (two) times daily. (Patient not taking: Reported on 09/24/2021) 60 tablet 1   Multiple Vitamin (MULTIVITAMIN WITH MINERALS) TABS tablet Take 1 tablet by mouth daily. (Patient not taking: Reported on 09/24/2021)      Results for orders placed or performed during the hospital encounter of 09/30/21 (from the past 48 hour(s))  Glucose, capillary     Status: Abnormal   Collection Time: 09/30/21  7:12 AM  Result Value Ref Range   Glucose-Capillary 202 (H) 70 - 99 mg/dL    Comment: Glucose reference range applies only to samples taken after fasting for at least 8 hours.   No results found.  Review of Systems  Constitutional:  Positive for unexpected weight change.   HENT:  Positive for trouble swallowing.   Eyes: Negative.   Respiratory: Negative.    Cardiovascular: Negative.   Gastrointestinal:  Positive for abdominal pain and diarrhea.  Endocrine: Negative.   Genitourinary: Negative.   Musculoskeletal: Negative.   Allergic/Immunologic: Negative.   Neurological: Negative.   Hematological: Negative.   Psychiatric/Behavioral: Negative.     Blood pressure 99/64, pulse Marland Kitchen)  106, temperature 97.7 F (36.5 C), temperature source Oral, resp. rate 14, height 5\' 3"  (1.6 m), weight 44.5 kg, SpO2 100 %. Physical Exam  GENERAL: The patient is AO x3, in no acute distress. HEENT: Head is normocephalic and atraumatic. EOMI are intact. Mouth is well hydrated and without lesions. NECK: Supple. No masses LUNGS: Clear to auscultation. No presence of rhonchi/wheezing/rales. Adequate chest expansion HEART: RRR, normal s1 and s2. ABDOMEN: Mildly tender upon palpation of the lower abdomen, no guarding, no peritoneal signs, and nondistended. BS +. No masses. EXTREMITIES: Without any cyanosis, clubbing, rash, lesions or edema. NEUROLOGIC: AOx3, no focal motor deficit. SKIN: no jaundice, no rashes  Assessment/Plan 66 year old female with past medical history of COPD (3L supplemental O2 at night), DM, GERD,  High cholesterol, diverticulitis, mesenteric artery stenosis who comes to the hospital for evaluation of diarrhea, abdominal pain, dysphagia, weight loss and occasional episodes of rectal bleeding.  We will proceed with EGD and colonoscopy.  76, MD 09/30/2021, 7:40 AM

## 2021-09-30 NOTE — Anesthesia Preprocedure Evaluation (Addendum)
Anesthesia Evaluation  Patient identified by MRN, date of birth, ID band Patient awake    Reviewed: Allergy & Precautions, NPO status , Patient's Chart, lab work & pertinent test results  Airway Mallampati: I  TM Distance: >3 FB Neck ROM: Full    Dental  (+) Poor Dentition   Pulmonary pneumonia, COPD,  oxygen dependent, Current Smoker and Patient abstained from smoking.,  3L [place] at night          Cardiovascular hypertension,      Neuro/Psych    GI/Hepatic GERD  ,  Endo/Other  diabetes, Poorly Controlled, Type 2, Oral Hypoglycemic Agents  Renal/GU      Musculoskeletal   Abdominal   Peds  Hematology   Anesthesia Other Findings   Reproductive/Obstetrics                            Anesthesia Physical Anesthesia Plan  ASA: 3  Anesthesia Plan: General   Post-op Pain Management:    Induction: Intravenous  PONV Risk Score and Plan: Propofol infusion  Airway Management Planned: Nasal Cannula, Natural Airway and Simple Face Mask  Additional Equipment:   Intra-op Plan:   Post-operative Plan:   Informed Consent: I have reviewed the patients History and Physical, chart, labs and discussed the procedure including the risks, benefits and alternatives for the proposed anesthesia with the patient or authorized representative who has indicated his/her understanding and acceptance.       Plan Discussed with:   Anesthesia Plan Comments:         Anesthesia Quick Evaluation

## 2021-09-30 NOTE — Anesthesia Postprocedure Evaluation (Signed)
Anesthesia Post Note  Patient: Dana Dominguez  Procedure(s) Performed: COLONOSCOPY WITH PROPOFOL ESOPHAGOGASTRODUODENOSCOPY (EGD) WITH PROPOFOL SAVORY DILATION BIOPSY  Patient location during evaluation: Phase II Anesthesia Type: General Level of consciousness: awake Pain management: pain level controlled Vital Signs Assessment: post-procedure vital signs reviewed and stable Respiratory status: spontaneous breathing and respiratory function stable Cardiovascular status: blood pressure returned to baseline and stable Postop Assessment: no headache and no apparent nausea or vomiting Anesthetic complications: no Comments: Late entry   No notable events documented.   Last Vitals:  Vitals:   09/30/21 0931 09/30/21 0936  BP: (!) 73/51 (!) 82/68  Pulse: 89 87  Resp: (!) 23 20  Temp: 36.7 C   SpO2: 100% 100%    Last Pain:  Vitals:   09/30/21 0936  TempSrc:   PainSc: 0-No pain                 Windell Norfolk

## 2021-09-30 NOTE — Op Note (Signed)
[facility] Patient Name: Dana Dominguez Procedure Date: 09/30/2021 8:44 AM MRN: [id number] Date of Birth: [birth year] Attending MD: Katrinka Blazing ,  CSN: [id number] Age: 66 Admit Type: Outpatient Procedure:                Upper GI endoscopy Indications:              Dysphagia Providers:                Katrinka Blazing, Jannett Celestine, RN, Enzo Montgomery RN,                            RN, Pandora Leiter, Technician Referring MD:              Medicines:                Monitored Anesthesia Care Complications:            No immediate complications. Estimated Blood Loss:     Estimated blood loss: none. Procedure:                Pre-Anesthesia Assessment:                           - Prior to the procedure, a History and Physical                            was performed, and patient medications, allergies                            and sensitivities were reviewed. The patient's                            tolerance of previous anesthesia was reviewed.                           - The risks and benefits of the procedure and the                            sedation options and risks were discussed with the                            patient. All questions were answered and informed                            consent was obtained.                           - ASA Grade Assessment: III - A patient with severe                            systemic disease.                           After obtaining informed consent, the endoscope was                            passed under direct vision. Throughout the  procedure, the patient's blood pressure, pulse, and                            oxygen saturations were monitored continuously. The                            [id number] scope was introduced through the                            mouth, and advanced to the second part of duodenum.                            The upper GI endoscopy was accomplished without                             difficulty. The patient tolerated the procedure                            well. Scope In: 8:52:07 AM Scope Out: 9:06:03 AM Total Procedure Duration: 0 hours 13 minutes 56 seconds  Findings:      One benign-appearing, intrinsic mild (non-circumferential scarring)       stenosis was found in the upper third of the esophagus. This stenosis       measured 1.2 cm (inner diameter) x 1 cm (in length). The stenosis was       traversed. A guidewire was placed and the scope was withdrawn. Dilation       was performed with a Savary dilator with mild resistance at 18 mm.       mucosal disruption was seen upon reinspection.      Diffuse moderate inflammation characterized by erosions, erythema and       granularity was found in the entire examined stomach. Biopsies were       taken with a cold forceps for Helicobacter pylori testing.      Multiple diffuse erosions without bleeding were found in the first       portion of the duodenum and in the second portion of the duodenum.      One non-bleeding superficial duodenal ulcer with a clean ulcer base       (Forrest Class III) was found in the first portion of the duodenum. The       lesion was 5 mm in largest dimension.      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 Impression:               - Benign-appearing esophageal stenosis. Dilated.                           - Gastritis. Biopsied.                           - Duodenal erosions without bleeding.                           - Non-bleeding duodenal ulcer with a clean ulcer  base (Forrest Class III). Moderate Sedation:      Per Anesthesia Care Recommendation:           - Discharge patient to home (ambulatory).                           - Resume previous diet.                           - Await pathology results.                           - Continue Dexilant 60 mg  qday.                           - Follow up at [facility] for chronic mesenteric ischemia                            management.                           - Restart Plavix 75 mg qday Procedure Code(s):        --- Professional ---                           [phone], Esophagogastroduodenoscopy, flexible,                            transoral; with insertion of guide wire followed by                            passage of dilator(s) through esophagus over guide                            wire                           43239, 59, Esophagogastroduodenoscopy, flexible,                            transoral; with biopsy, single or multiple Diagnosis Code(s):        --- Professional ---                           K22.2, Esophageal obstruction                           K29.70, Gastritis, unspecified, without bleeding                           K26.9, Duodenal ulcer, unspecified as acute or                            chronic, without hemorrhage or perforation                           R13.10, Dysphagia, unspecified CPT copyright 2019 [employer]. All rights reserved. The codes documented in this  report are preliminary and upon coder review may  be revised to meet current compliance requirements. Maylon Peppers, MD Maylon Peppers,  09/30/2021 9:36:34 AM This report has been signed electronically. Number of Addenda: 0

## 2021-09-30 NOTE — Transfer of Care (Signed)
Immediate Anesthesia Transfer of Care Note  Patient: Dana Dominguez  Procedure(s) Performed: COLONOSCOPY WITH PROPOFOL ESOPHAGOGASTRODUODENOSCOPY (EGD) WITH PROPOFOL SAVORY DILATION BIOPSY  Patient Location: Endoscopy Unit  Anesthesia Type:General  Level of Consciousness: awake  Airway & Oxygen Therapy: Patient Spontanous Breathing  Post-op Assessment: Report given to RN and Post -op Vital signs reviewed and stable  Post vital signs: Reviewed and stable  Last Vitals:  Vitals Value Taken Time  BP 73/51   Temp    Pulse 90   Resp 20   SpO2 100%     Last Pain:  Vitals:   09/30/21 0849  TempSrc:   PainSc: 9       Patients Stated Pain Goal: 9 (09/30/21 0706)  Complications: No notable events documented.

## 2021-09-30 NOTE — Discharge Instructions (Addendum)
You are being discharged to home.  Resume your previous diet.  We are waiting for your pathology results.  Continue Dexilant 60 mg qday. Follow up at [facility] for chronic mesenteric ischemia management. - Restart Plavix 75 mg qday Repeat colonoscopy in 10years

## 2021-09-30 NOTE — Op Note (Signed)
[facility] Patient Name: Dana Dominguez Procedure Date: 09/30/2021 8:44 AM MRN: [id number] Date of Birth: 04-Oct-1955 Attending MD: Maylon Peppers ,  CSN: [id number] Age: 66 Admit Type: Outpatient Procedure:                Colonoscopy Indications:              Chronic diarrhea, Hematochezia Providers:                Maylon Peppers, Janeece Riggers, RN, Hughie Closs RN,                            RN Referring MD:              Medicines:                Monitored Anesthesia Care Complications:            No immediate complications. Estimated Blood Loss:     Estimated blood loss: none. Procedure:                Pre-Anesthesia Assessment:                           - Prior to the procedure, a History and Physical                            was performed, and patient medications, allergies                            and sensitivities were reviewed. The patient's                            tolerance of previous anesthesia was reviewed.                           - The risks and benefits of the procedure and the                            sedation options and risks were discussed with the                            patient. All questions were answered and informed                            consent was obtained.                           - ASA Grade Assessment: III - A patient with severe                            systemic disease.                           After obtaining informed consent, the colonoscope                            was passed under direct vision. Throughout the  procedure, the patient's blood pressure, pulse, and                            oxygen saturations were monitored continuously. The                            [id number] scope was introduced through                            the anus and advanced to the the terminal ileum.                            The colonoscopy was performed without difficulty.                            The  patient tolerated the procedure well. The                            quality of the bowel preparation was good. Scope In: 9:11:08 AM Scope Out: 9:28:27 AM Scope Withdrawal Time: 0 hours 10 minutes 8 seconds  Total Procedure Duration: 0 hours 17 minutes 19 seconds  Findings:      The perianal and digital rectal examinations were normal.      There was evidence of a prior end-to-side ileo-colonic anastomosis at 70       cm proximal to the anus. This was patent. The anastomosis was traversed.       The area distal to the anastomosis was ulcerated in teh antimesenteric       region with presence of erythema and edema. This was biopsied with a       cold forceps for histology.      The neo-terminal ileum appeared normal.      The rest of the colon appeared normal. Biopsies for histology were taken       with a cold forceps from the right colon and left colon for evaluation       of microscopic colitis.      A few small-mouthed diverticula were found in the sigmoid colon.      There was evidence of a prior end-to-side colo-colonic anastomosis at 15       cm proximal to the anus. This was patent and was characterized by       healthy appearing mucosa. The anastomosis was traversed.      The retroflexed view of the distal rectum and anal verge was normal and       showed no anal or rectal abnormalities.      Note: findings highly suggestive of ischemic colitis Impression:               - The examined portion of the ileum was normal.                           - Patent end-to-side ileo-colonic anastomosis.                            Biopsied.                           -  The entire examined colon is normal. Biopsied.                           - Diverticulosis in the sigmoid colon.                           - Patent end-to-side colo-colonic anastomosis,                            characterized by healthy appearing mucosa.                           - The distal rectum and anal verge are normal on                             retroflexion view. Moderate Sedation:      Per Anesthesia Care Recommendation:           - Discharge patient to home (ambulatory).                           - Resume previous diet.                           - Await pathology results.                           - Repeat colonoscopy in 10 years for screening                            purposes. Procedure Code(s):        --- Professional ---                           [phone], Colonoscopy, flexible; with biopsy, single                            or multiple Diagnosis Code(s):        --- Professional ---                           Z98.0, Intestinal bypass and anastomosis status                           K52.9, Noninfective gastroenteritis and colitis,                            unspecified                           K92.1, Melena (includes Hematochezia)                           K57.30, Diverticulosis of large intestine without                            perforation or abscess without bleeding CPT copyright 2019 American [employer] Association. All rights reserved. The codes documented in  this report are preliminary and upon coder review may  be revised to meet current compliance requirements. Maylon Peppers, MD Maylon Peppers,  09/30/2021 9:43:15 AM This report has been signed electronically. Number of Addenda: 0

## 2021-10-01 ENCOUNTER — Encounter (INDEPENDENT_AMBULATORY_CARE_PROVIDER_SITE_OTHER): Payer: Self-pay | Admitting: *Deleted

## 2021-10-01 LAB — SURGICAL PATHOLOGY

## 2021-10-02 ENCOUNTER — Encounter (HOSPITAL_COMMUNITY): Payer: Self-pay | Admitting: Gastroenterology

## 2021-10-10 ENCOUNTER — Other Ambulatory Visit: Payer: Self-pay | Admitting: Nurse Practitioner

## 2021-10-10 DIAGNOSIS — E1165 Type 2 diabetes mellitus with hyperglycemia: Secondary | ICD-10-CM

## 2021-10-12 ENCOUNTER — Other Ambulatory Visit (HOSPITAL_COMMUNITY): Payer: Medicare Other

## 2021-10-12 ENCOUNTER — Emergency Department (HOSPITAL_COMMUNITY): Payer: Medicare Other

## 2021-10-12 ENCOUNTER — Other Ambulatory Visit: Payer: Self-pay

## 2021-10-12 ENCOUNTER — Emergency Department (HOSPITAL_COMMUNITY)
Admission: EM | Admit: 2021-10-12 | Discharge: 2021-10-12 | Disposition: A | Discharge status: Home / Self Care | Payer: Medicare Other | Attending: Emergency Medicine | Admitting: Emergency Medicine

## 2021-10-12 ENCOUNTER — Encounter (HOSPITAL_COMMUNITY): Payer: Self-pay

## 2021-10-12 DIAGNOSIS — E119 Type 2 diabetes mellitus without complications: Secondary | ICD-10-CM | POA: Diagnosis not present

## 2021-10-12 DIAGNOSIS — I10 Essential (primary) hypertension: Secondary | ICD-10-CM | POA: Insufficient documentation

## 2021-10-12 DIAGNOSIS — R42 Dizziness and giddiness: Secondary | ICD-10-CM | POA: Diagnosis present

## 2021-10-12 DIAGNOSIS — Z20822 Contact with and (suspected) exposure to covid-19: Secondary | ICD-10-CM | POA: Insufficient documentation

## 2021-10-12 DIAGNOSIS — W01198A Fall on same level from slipping, tripping and stumbling with subsequent striking against other object, initial encounter: Secondary | ICD-10-CM | POA: Diagnosis not present

## 2021-10-12 DIAGNOSIS — Y92002 Bathroom of unspecified non-institutional (private) residence single-family (private) house as the place of occurrence of the external cause: Secondary | ICD-10-CM | POA: Insufficient documentation

## 2021-10-12 DIAGNOSIS — Z7984 Long term (current) use of oral hypoglycemic drugs: Secondary | ICD-10-CM | POA: Insufficient documentation

## 2021-10-12 DIAGNOSIS — Z7982 Long term (current) use of aspirin: Secondary | ICD-10-CM | POA: Insufficient documentation

## 2021-10-12 DIAGNOSIS — E860 Dehydration: Secondary | ICD-10-CM | POA: Insufficient documentation

## 2021-10-12 DIAGNOSIS — J449 Chronic obstructive pulmonary disease, unspecified: Secondary | ICD-10-CM | POA: Insufficient documentation

## 2021-10-12 DIAGNOSIS — W19XXXA Unspecified fall, initial encounter: Secondary | ICD-10-CM

## 2021-10-12 DIAGNOSIS — Z794 Long term (current) use of insulin: Secondary | ICD-10-CM | POA: Insufficient documentation

## 2021-10-12 DIAGNOSIS — Z7901 Long term (current) use of anticoagulants: Secondary | ICD-10-CM | POA: Insufficient documentation

## 2021-10-12 LAB — CBC WITH DIFFERENTIAL/PLATELET
Abs Immature Granulocytes: 0.03 10*3/uL (ref 0.00–0.07)
Basophils Absolute: 0.1 10*3/uL (ref 0.0–0.1)
Basophils Relative: 1 %
Eosinophils Absolute: 0 10*3/uL (ref 0.0–0.5)
Eosinophils Relative: 0 %
HCT: 39.7 % (ref 36.0–46.0)
Hemoglobin: 12.2 g/dL (ref 12.0–15.0)
Immature Granulocytes: 0 %
Lymphocytes Relative: 17 %
Lymphs Abs: 1.5 10*3/uL (ref 0.7–4.0)
MCH: 25.6 pg — ABNORMAL LOW (ref 26.0–34.0)
MCHC: 30.7 g/dL (ref 30.0–36.0)
MCV: 83.2 fL (ref 80.0–100.0)
Monocytes Absolute: 0.8 10*3/uL (ref 0.1–1.0)
Monocytes Relative: 9 %
Neutro Abs: 6.5 10*3/uL (ref 1.7–7.7)
Neutrophils Relative %: 73 %
Platelets: 261 10*3/uL (ref 150–400)
RBC: 4.77 MIL/uL (ref 3.87–5.11)
RDW: 23.2 % — ABNORMAL HIGH (ref 11.5–15.5)
WBC: 9 10*3/uL (ref 4.0–10.5)
nRBC: 0 % (ref 0.0–0.2)

## 2021-10-12 LAB — COMPREHENSIVE METABOLIC PANEL
ALT: 17 U/L (ref 0–44)
AST: 33 U/L (ref 15–41)
Albumin: 2.2 g/dL — ABNORMAL LOW (ref 3.5–5.0)
Alkaline Phosphatase: 112 U/L (ref 38–126)
Anion gap: 11 (ref 5–15)
BUN: 13 mg/dL (ref 8–23)
CO2: 26 mmol/L (ref 22–32)
Calcium: 8.5 mg/dL — ABNORMAL LOW (ref 8.9–10.3)
Chloride: 98 mmol/L (ref 98–111)
Creatinine, Ser: 0.75 mg/dL (ref 0.44–1.00)
GFR, Estimated: >60 mL/min (ref >60)
Glucose, Bld: 128 mg/dL — ABNORMAL HIGH (ref 70–99)
Potassium: 4.1 mmol/L (ref 3.5–5.1)
Sodium: 135 mmol/L (ref 135–145)
Total Bilirubin: 0.4 mg/dL (ref 0.3–1.2)
Total Protein: 6.8 g/dL (ref 6.5–8.1)

## 2021-10-12 LAB — URINALYSIS, ROUTINE W REFLEX MICROSCOPIC
Bacteria, UA: NONE SEEN
Bilirubin Urine: NEGATIVE
Glucose, UA: NEGATIVE mg/dL
Hgb urine dipstick: NEGATIVE
Ketones, ur: 20 mg/dL — AB
Leukocytes,Ua: NEGATIVE
Nitrite: NEGATIVE
Protein, ur: 30 mg/dL — AB
Specific Gravity, Urine: 1.016 (ref 1.005–1.030)
pH: 7 (ref 5.0–8.0)

## 2021-10-12 LAB — RESP PANEL BY RT-PCR (FLU A&B, COVID) ARPGX2
Influenza A by PCR: NEGATIVE
Influenza B by PCR: NEGATIVE
SARS Coronavirus 2 by RT PCR: NEGATIVE

## 2021-10-12 LAB — PROTIME-INR
INR: 0.9 (ref 0.8–1.2)
Prothrombin Time: 12.4 seconds (ref 11.4–15.2)

## 2021-10-12 LAB — MAGNESIUM: Magnesium: 1.3 mg/dL — ABNORMAL LOW (ref 1.7–2.4)

## 2021-10-12 LAB — LIPASE, BLOOD: Lipase: 19 U/L (ref 11–51)

## 2021-10-12 MED ORDER — SODIUM CHLORIDE 0.9 % IV BOLUS
1000.0000 mL | Freq: Once | INTRAVENOUS | Status: AC
Start: 2021-10-12 — End: 2021-10-12
  Administered 2021-10-12: 1000 mL via INTRAVENOUS

## 2021-10-12 MED ORDER — MAGNESIUM OXIDE -MG SUPPLEMENT 400 (240 MG) MG PO TABS
400.0000 mg | ORAL_TABLET | Freq: Once | ORAL | Status: AC
  Administered 2021-10-12: 400 mg via ORAL
  Filled 2021-10-12: qty 1

## 2021-10-12 NOTE — ED Provider Notes (Cosign Needed)
[facility] EMERGENCY DEPARTMENT Provider Note   CSN: [id number] Arrival date & time: 10/12/21  1627     History  Chief Complaint  Patient presents with   Dana Dominguez is a 66 y.o. female with a past medical history of type 2 diabetes, hypertension, hyperlipidemia, COPD and GERD presenting today due to dehydration.  Patient's husband reports that over the past few weeks she has had multiple falls that he believes are from dehydration.  She states that occasionally when she gets up to move too quickly she becomes dizzy and falls.  She endorses a decrease in appetite and thus decrease in oral intake.  She says this has been going on since her colonoscopy/endoscopy 2 weeks ago.  Today she pivoted in the bathroom and fell, hitting her head.  She is on Plavix due to carotid artery stenosis and mesenteric ischemia.  Denies any abdominal pain today.   Home Medications Prior to Admission medications   Medication Sig Start Date End Date Taking? Authorizing Provider  ARIPiprazole (ABILIFY) 5 MG tablet Take 5 mg by mouth daily.    [provider]  aspirin EC 81 MG tablet Take 81 mg by mouth daily.    [provider]  atorvastatin (LIPITOR) 80 MG tablet Take 80 mg by mouth daily.     [provider]  B-D ULTRAFINE III SHORT PEN 31G X 8 MM MISC SMARTSIG:1 Each SUB-Q Daily 06/17/20   Brita Romp, NP  BD INSULIN SYRINGE U/F 31G X 5/16" 0.3 ML MISC 1 each by Other route at bedtime. 06/16/20   Cassandria Anger, MD  busPIRone (BUSPAR) 15 MG tablet Take 15 mg by mouth 3 (three) times daily.    [provider]  clopidogrel (PLAVIX) 75 MG tablet Take 75 mg by mouth daily. 05/05/21   [provider]  Continuous Blood Gluc Sensor (FREESTYLE LIBRE 14 DAY SENSOR) MISC INJECT 1 SENSOR INTO THE SKIN EVERY 14 DAYS AS DIRECTED 10/12/21   Brita Romp, NP  desloratadine (CLARINEX) 5 MG tablet Take 5 mg by mouth daily.    [provider]   dexlansoprazole (DEXILANT) 60 MG capsule Take 60 mg by mouth daily.    [provider]  dicyclomine (BENTYL) 10 MG capsule Take 10 mg by mouth 2 (two) times daily.    [provider]  Docusate Sodium (DSS) 100 MG CAPS Take 200 mg by mouth 2 (two) times daily. 05/25/17   [provider]  DULoxetine (CYMBALTA) 60 MG capsule Take 60 mg by mouth 2 (two) times daily.    [provider]  feeding supplement (ENSURE ENLIVE / ENSURE PLUS) LIQD Take 237 mLs by mouth 2 (two) times daily between meals. Patient not taking: Reported on 09/24/2021 08/23/21   Barton Dubois, MD  glipiZIDE (GLUCOTROL XL) 5 MG 24 hr tablet Take 5 mg by mouth daily. 08/31/21   [provider]  hydrocortisone (CORTEF) 10 MG tablet Take 1 tablet (10 mg total) by mouth 2 (two) times daily. Patient not taking: Reported on 09/24/2021 08/23/21 10/22/21  Barton Dubois, MD  hydrOXYzine (ATARAX/VISTARIL) 50 MG tablet Take 50 mg by mouth 2 (two) times daily.    [provider]  metFORMIN (GLUCOPHAGE-XR) 500 MG 24 hr tablet Take 1 tablet (500 mg total) by mouth daily with breakfast. TAKE 2 TABLETS BY MOUTH ONCE DAILY WITH BREAKFAST Patient taking differently: Take 1,000 mg by mouth daily with breakfast. 08/23/21   Barton Dubois, MD  midodrine (Sanderson)  2.5 MG tablet Take 2.5 mg by mouth 2 (two) times daily. 09/15/21   [provider]  Multiple Vitamin (MULTIVITAMIN WITH MINERALS) TABS tablet Take 1 tablet by mouth daily. Patient not taking: Reported on 09/24/2021 08/24/21   Vassie Loll, MD  nicotine (NICODERM CQ - DOSED IN MG/24 HOURS) 21 mg/24hr patch Place 1 patch (21 mg total) onto the skin daily. 08/24/21   Vassie Loll, MD  traZODone (DESYREL) 100 MG tablet Take 200 mg by mouth at bedtime.  10/22/14   [provider]  Vitamin D, Ergocalciferol, (DRISDOL) 1.25 MG (50000 UNIT) CAPS capsule Take 1 capsule (50,000 Units total) by mouth every 7 (seven) days. 07/28/21   Dani Gobble, NP      Allergies    Ticagrelor, Pantoprazole, Protonix [pantoprazole sodium], Codeine, and Itraconazole    Review of Systems   Review of Systems See HPI  Physical Exam Updated Vital Signs BP 111/69    Pulse 100    Temp 98.1 F (36.7 C)    Resp 18    Ht 5\' 3"  (1.6 m)    Wt 45.4 kg    SpO2 92%    BMI 17.71 kg/m  Physical Exam Vitals and nursing note reviewed.  Constitutional:      General: She is not in acute distress.    Appearance: Normal appearance. She is ill-appearing (Chronically ill-appearing, underweight).  HENT:     Head: Normocephalic and atraumatic.     Mouth/Throat:     Mouth: Mucous membranes are dry.     Pharynx: Oropharynx is clear.  Eyes:     General: No scleral icterus.    Conjunctiva/sclera: Conjunctivae normal.  Cardiovascular:     Rate and Rhythm: Normal rate and regular rhythm.  Pulmonary:     Effort: Pulmonary effort is normal. No respiratory distress.     Breath sounds: No wheezing.     Comments: Saturations 100% on 2 L of oxygen that she reports she wears nightly Abdominal:     Tenderness: There is no abdominal tenderness.  Musculoskeletal:     Right lower leg: No edema.     Left lower leg: No edema.     Comments: Distal sensation intact  Skin:    General: Skin is warm and dry.     Findings: No rash.  Neurological:     Mental Status: She is alert.  Psychiatric:        Mood and Affect: Mood normal.        Behavior: Behavior normal.    ED Results / Procedures / Treatments   Labs (all labs ordered are listed, but only abnormal results are displayed) Labs Reviewed  CBC WITH DIFFERENTIAL/PLATELET - Abnormal; Notable for the following components:      Result Value   MCH 25.6 (*)    RDW 23.2 (*)    All other components within normal limits  COMPREHENSIVE METABOLIC PANEL - Abnormal; Notable for the following components:   Glucose, Bld 128 (*)    Calcium 8.5 (*)    Albumin 2.2 (*)    All other components within normal limits   MAGNESIUM - Abnormal; Notable for the following components:   Magnesium 1.3 (*)    All other components within normal limits  URINALYSIS, ROUTINE W REFLEX MICROSCOPIC - Abnormal; Notable for the following components:   Ketones, ur 20 (*)    Protein, ur 30 (*)    All other components within normal limits  RESP PANEL BY RT-PCR (FLU A&B, COVID)  ARPGX2  PROTIME-INR  LIPASE, BLOOD    EKG None  Radiology CT Head Wo Contrast  Result Date: 10/12/2021 CLINICAL DATA:  [place] backwards, hit head EXAM: CT HEAD WITHOUT CONTRAST TECHNIQUE: Contiguous axial images were obtained from the base of the skull through the vertex without intravenous contrast. RADIATION DOSE REDUCTION: This exam was performed according to the departmental dose-optimization program which includes automated exposure control, adjustment of the mA and/or kV according to patient size and/or use of iterative reconstruction technique. COMPARISON:  None. FINDINGS: Brain: No acute infarct or hemorrhage. Lateral ventricles and midline structures are unremarkable. No acute extra-axial fluid collections. No mass effect. Vascular: Bilateral internal carotid artery atherosclerosis. No hyperdense vessel. Skull: Normal. Negative for fracture or focal lesion. Sinuses/Orbits: Postsurgical changes from bilateral ethmoidectomies and medial wall antrectomies. Mucoperiosteal thickening within the left maxillary sinus. No gas fluid levels. Other: None. IMPRESSION: 1. No acute intracranial process. Electronically Signed   By: Randa Ngo M.D.   On: 10/12/2021 18:12   CT Cervical Spine Wo Contrast  Result Date: 10/12/2021 CLINICAL DATA:  [place], hit head, anticoagulated EXAM: CT CERVICAL SPINE WITHOUT CONTRAST TECHNIQUE: Multidetector CT imaging of the cervical spine was performed without intravenous contrast. Multiplanar CT image reconstructions were also generated. RADIATION DOSE REDUCTION: This exam was performed according to the departmental  dose-optimization program which includes automated exposure control, adjustment of the mA and/or kV according to patient size and/or use of iterative reconstruction technique. COMPARISON:  None. FINDINGS: Alignment: Alignment is grossly anatomic. Skull base and vertebrae: No acute fracture. No primary bone lesion or focal pathologic process. Soft tissues and spinal canal: No prevertebral fluid or swelling. No visible canal hematoma. Disc levels: Previous C6-C7 ACDF. Severe spondylosis at C4-5, with less severe spondylosis at C5-6 and C7-T1. There is diffuse facet hypertrophy, with bony fusion across the right facet joints at C3-4. Upper chest: Biapical emphysema.  Airway is patent. Other: Reconstructed images demonstrate no additional findings. IMPRESSION: 1. No acute cervical spine fracture. 2. Previous C6-7 ACDF. 3. Multilevel spondylosis and facet hypertrophy, greatest at C4-5. 4.  Emphysema (ICD10-J43.9). Electronically Signed   By: Randa Ngo M.D.   On: 10/12/2021 18:09    Procedures Procedures   NSR, normal rate  Medications Ordered in ED Medications  sodium chloride 0.9 % bolus 1,000 mL (0 mLs Intravenous Stopped 10/12/21 2025)  magnesium oxide (MAG-OX) tablet 400 mg (400 mg Oral Given 10/12/21 2018)    ED Course/ Medical Decision Making/ A&P                           Medical Decision Making Amount and/or Complexity of Data Reviewed Labs: ordered. Radiology: ordered.  Risk OTC drugs.   66 year old female presented today after a fall.  She is on Plavix and hit her head.  She reports that she has had multiple falls recently due to dehydration and decreased appetite.  Per chart review, patient was admitted to the hospital for COPD exacerbation last month.  She was found to be have a pneumonia and malnutrition.  She followed up with GI and ultimately had a endoscopy and colonoscopy on 2/15.  Biopsies were negative.  Since then, patient has continued to have a decreased appetite and  sustaining falls at home.   Physical exam: Patient is ambulatory through the department.  Denies any dizziness at this time.  Distal sensation intact.    Work-up: EKG was obtained due to patient's falls this was reviewed by me  and patient maintains normal sinus rhythm, without arrhythmia.  Lab work also reviewed by me, pertinent results include a magnesium of 1.3.  Urinalysis with ketonuria and proteinuria.  Potentially secondary to dehydration.  All other electrolytes within normal limits.  Imaging: CT head and neck ordered by me.  I viewed these images and noted no acute injuries.  No hemorrhage.  Radiologist also noted no acute findings.  MDM/Dispo: At this time I believe patient is stable for discharge home with follow-up with her PCP.  Patient's husband reports that they already have an appointment on Thursday.  They will continue to work on the patient's diet and assure that she is having a sufficient caloric intake.  I suspect electrolyte derangements/malnutrition is the cause of her weakness and multiple falls.  No further concerns or questions at this time.  Discharged in good condition.  Final Clinical Impression(s) / ED Diagnoses Final diagnoses:  Hypomagnesemia  Fall in home, initial encounter    Rx / DC Orders   Results and diagnoses were explained to the patient. Return precautions discussed in full. Patient had no additional questions and expressed complete understanding.   This chart was dictated using voice recognition software.  Despite best efforts to proofread,  errors can occur which can change the documentation meaning.    Rhae Hammock, PA-C 10/12/21 2128

## 2021-10-12 NOTE — ED Provider Triage Note (Cosign Needed)
Emergency Medicine Provider Triage Evaluation Note  Dana Dominguez , a 66 y.o. female  was evaluated in triage.  Pt complains of a fall that occurred today.  She was in the bathroom when she fell backwards and hit her head.  Is on Plavix for abdominal stent placement.  Denies any headache or visual disturbance but states she is dizzy.  Per patient's husband she has been having a decreased oral intake as well as diarrhea for the past few weeks.  She reports multiple falls because she "gets tripped up on her feet."  Does states she has been dizzy and lightheaded prior to some of her falls.  Review of Systems  Positive: As above Negative: Chest pain, difficulty breathing, headache or visual disturbance  Physical Exam  BP (!) 142/118    Pulse (!) 112    Temp 98.1 F (36.7 C)    Resp 18    Ht 5\' 3"  (1.6 m)    Wt 45.4 kg    SpO2 (!) 88%    BMI 17.71 kg/m  Gen:   Awake, no distress   Resp:  Normal effort  MSK:   Moves extremities without difficulty.  Full range of motion of neck Other:  PERRLA, cranial nerves intact, 5 out of 5 strength in upper extremities.  Medical Decision Making  Medically screening exam initiated at 4:53 PM.  Appropriate orders placed.  [name] was informed that the remainder of the evaluation will be completed by another provider, this initial triage assessment does not replace that evaluation, and the importance of remaining in the ED until their evaluation is complete.     Rhae Hammock, PA-C 10/12/21 1656

## 2021-10-12 NOTE — ED Triage Notes (Signed)
Pt brought to ED for multiple falls today. Husband thinks she is dehydrated. Pt is not eating and drinking like she should. Fell due to weakness. Denies LOC. Pt did hit her head, pt is on plavix.

## 2021-10-12 NOTE — Discharge Instructions (Addendum)
Please read the information about low magnesium attached to these discharge papers.  This is a very important electrolyte to maintain within normal limits.  Follow-up with your primary care provider on Thursday as planned.  You may talk about this visit, your dehydration and multiple falls.

## 2021-10-26 ENCOUNTER — Ambulatory Visit: Payer: Medicare Other | Admitting: Nurse Practitioner

## 2022-06-27 ENCOUNTER — Encounter (INDEPENDENT_AMBULATORY_CARE_PROVIDER_SITE_OTHER): Payer: Self-pay | Admitting: Gastroenterology

## 2022-08-16 DEATH — deceased
